# Patient Record
Sex: Male | Born: 1965
Health system: Southern US, Community
[De-identification: ages and names within clinical notes are randomized; demographics above are authoritative.]

## PROBLEM LIST (undated history)

## (undated) ENCOUNTER — Emergency Department (HOSPITAL_COMMUNITY): Admission: EM | Payer: No Typology Code available for payment source | Source: Home / Self Care

## (undated) DIAGNOSIS — E785 Hyperlipidemia, unspecified: Secondary | ICD-10-CM

## (undated) DIAGNOSIS — F1910 Other psychoactive substance abuse, uncomplicated: Secondary | ICD-10-CM

## (undated) DIAGNOSIS — I10 Essential (primary) hypertension: Secondary | ICD-10-CM

## (undated) DIAGNOSIS — E119 Type 2 diabetes mellitus without complications: Secondary | ICD-10-CM

## (undated) DIAGNOSIS — K259 Gastric ulcer, unspecified as acute or chronic, without hemorrhage or perforation: Secondary | ICD-10-CM

## (undated) DIAGNOSIS — B1920 Unspecified viral hepatitis C without hepatic coma: Secondary | ICD-10-CM

## (undated) HISTORY — PX: STOMACH SURGERY: SHX791

## (undated) HISTORY — DX: Type 2 diabetes mellitus without complications: E11.9

## (undated) HISTORY — DX: Other psychoactive substance abuse, uncomplicated: F19.10

---

## 2005-03-27 ENCOUNTER — Emergency Department (HOSPITAL_COMMUNITY): Admission: EM | Admit: 2005-03-27 | Discharge: 2005-03-27 | Payer: Self-pay | Admitting: Family Medicine

## 2005-05-26 ENCOUNTER — Ambulatory Visit: Payer: Self-pay | Admitting: Nurse Practitioner

## 2005-06-15 ENCOUNTER — Ambulatory Visit: Payer: Self-pay | Admitting: *Deleted

## 2005-08-10 ENCOUNTER — Ambulatory Visit: Payer: Self-pay | Admitting: Nurse Practitioner

## 2006-07-26 ENCOUNTER — Emergency Department (HOSPITAL_COMMUNITY): Admission: EM | Admit: 2006-07-26 | Discharge: 2006-07-26 | Payer: Self-pay | Admitting: Emergency Medicine

## 2006-10-10 ENCOUNTER — Emergency Department (HOSPITAL_COMMUNITY): Admission: EM | Admit: 2006-10-10 | Discharge: 2006-10-10 | Payer: Self-pay | Admitting: Emergency Medicine

## 2006-11-21 ENCOUNTER — Emergency Department (HOSPITAL_COMMUNITY): Admission: EM | Admit: 2006-11-21 | Discharge: 2006-11-22 | Payer: Self-pay | Admitting: Emergency Medicine

## 2006-12-21 ENCOUNTER — Emergency Department (HOSPITAL_COMMUNITY): Admission: EM | Admit: 2006-12-21 | Discharge: 2006-12-21 | Payer: Self-pay | Admitting: Emergency Medicine

## 2007-06-15 ENCOUNTER — Emergency Department (HOSPITAL_COMMUNITY): Admission: EM | Admit: 2007-06-15 | Discharge: 2007-06-15 | Payer: Self-pay | Admitting: Emergency Medicine

## 2007-07-31 ENCOUNTER — Emergency Department (HOSPITAL_COMMUNITY): Admission: EM | Admit: 2007-07-31 | Discharge: 2007-07-31 | Payer: Self-pay | Admitting: Emergency Medicine

## 2007-09-21 ENCOUNTER — Emergency Department (HOSPITAL_COMMUNITY): Admission: EM | Admit: 2007-09-21 | Discharge: 2007-09-21 | Payer: Self-pay | Admitting: Family Medicine

## 2007-10-14 ENCOUNTER — Emergency Department (HOSPITAL_COMMUNITY): Admission: EM | Admit: 2007-10-14 | Discharge: 2007-10-14 | Payer: Self-pay | Admitting: Emergency Medicine

## 2007-10-24 ENCOUNTER — Emergency Department (HOSPITAL_COMMUNITY): Admission: EM | Admit: 2007-10-24 | Discharge: 2007-10-24 | Payer: Self-pay | Admitting: Emergency Medicine

## 2007-10-31 ENCOUNTER — Ambulatory Visit: Payer: Self-pay | Admitting: Family Medicine

## 2007-10-31 ENCOUNTER — Encounter (INDEPENDENT_AMBULATORY_CARE_PROVIDER_SITE_OTHER): Payer: Self-pay | Admitting: Nurse Practitioner

## 2007-10-31 LAB — CONVERTED CEMR LAB
ALT: 27 units/L (ref 0–53)
Albumin: 4.4 g/dL (ref 3.5–5.2)
Amylase: 70 units/L (ref 0–105)
Basophils Absolute: 0 10*3/uL (ref 0.0–0.1)
Chloride: 104 meq/L (ref 96–112)
Creatinine, Ser: 1.08 mg/dL (ref 0.40–1.50)
HCV Ab: POSITIVE — AB
HDL: 49 mg/dL (ref >39)
Hep B Core Total Ab: POSITIVE — AB
Lipase: 20 units/L (ref 0–75)
Lymphs Abs: 1.9 10*3/uL (ref 0.7–4.0)
MCV: 96.7 fL (ref 78.0–100.0)
Platelets: 245 10*3/uL (ref 150–400)
RBC: 5.44 M/uL (ref 4.22–5.81)
TSH: 0.509 microintl units/mL (ref 0.350–5.50)
Total Bilirubin: 0.5 mg/dL (ref 0.3–1.2)
Total Protein: 7.4 g/dL (ref 6.0–8.3)
Triglycerides: 160 mg/dL — ABNORMAL HIGH (ref <150)
WBC: 5 10*3/uL (ref 4.0–10.5)

## 2007-11-15 ENCOUNTER — Ambulatory Visit: Payer: Self-pay | Admitting: Internal Medicine

## 2007-11-15 DIAGNOSIS — B182 Chronic viral hepatitis C: Secondary | ICD-10-CM

## 2007-11-15 DIAGNOSIS — R1013 Epigastric pain: Secondary | ICD-10-CM

## 2007-11-15 LAB — CONVERTED CEMR LAB
HCV Quantitative: 1390000 intl units/mL — ABNORMAL HIGH (ref <43)
Hepatitis B Surface Ag: NEGATIVE

## 2007-11-22 ENCOUNTER — Ambulatory Visit (HOSPITAL_COMMUNITY): Admission: RE | Admit: 2007-11-22 | Discharge: 2007-11-22 | Payer: Self-pay | Admitting: Internal Medicine

## 2007-11-22 ENCOUNTER — Encounter: Payer: Self-pay | Admitting: Internal Medicine

## 2007-11-23 ENCOUNTER — Telehealth: Payer: Self-pay | Admitting: Internal Medicine

## 2007-11-24 ENCOUNTER — Ambulatory Visit: Payer: Self-pay | Admitting: Internal Medicine

## 2007-12-11 ENCOUNTER — Telehealth: Payer: Self-pay | Admitting: Internal Medicine

## 2007-12-14 ENCOUNTER — Ambulatory Visit: Payer: Self-pay | Admitting: Internal Medicine

## 2007-12-14 ENCOUNTER — Encounter (INDEPENDENT_AMBULATORY_CARE_PROVIDER_SITE_OTHER): Payer: Self-pay | Admitting: Nurse Practitioner

## 2007-12-14 LAB — CONVERTED CEMR LAB
Cholesterol: 212 mg/dL — ABNORMAL HIGH (ref 0–200)
HDL: 45 mg/dL (ref >39)
HIV-1 RNA Quant, Log: <1.7 (ref <1.70)
LDL Cholesterol: 153 mg/dL — ABNORMAL HIGH (ref 0–99)
Total CHOL/HDL Ratio: 4.7
Triglycerides: 68 mg/dL (ref <150)
VLDL: 14 mg/dL (ref 0–40)

## 2007-12-20 ENCOUNTER — Encounter (INDEPENDENT_AMBULATORY_CARE_PROVIDER_SITE_OTHER): Payer: Self-pay | Admitting: Nurse Practitioner

## 2007-12-20 LAB — CONVERTED CEMR LAB: HCV Quantitative: 2860000 intl units/mL — ABNORMAL HIGH (ref <43)

## 2008-01-08 ENCOUNTER — Ambulatory Visit: Payer: Self-pay | Admitting: Internal Medicine

## 2008-01-09 ENCOUNTER — Ambulatory Visit: Payer: Self-pay | Admitting: Internal Medicine

## 2008-02-06 ENCOUNTER — Encounter (INDEPENDENT_AMBULATORY_CARE_PROVIDER_SITE_OTHER): Payer: Self-pay | Admitting: Diagnostic Radiology

## 2008-02-06 ENCOUNTER — Ambulatory Visit (HOSPITAL_COMMUNITY): Admission: RE | Admit: 2008-02-06 | Discharge: 2008-02-06 | Payer: Self-pay | Admitting: Internal Medicine

## 2008-05-16 ENCOUNTER — Ambulatory Visit: Payer: Self-pay | Admitting: Gastroenterology

## 2008-05-16 ENCOUNTER — Encounter: Payer: Self-pay | Admitting: Internal Medicine

## 2008-05-27 ENCOUNTER — Ambulatory Visit: Payer: Self-pay | Admitting: Internal Medicine

## 2008-05-27 ENCOUNTER — Encounter (INDEPENDENT_AMBULATORY_CARE_PROVIDER_SITE_OTHER): Payer: Self-pay | Admitting: Internal Medicine

## 2008-05-27 LAB — CONVERTED CEMR LAB
ALT: 51 units/L (ref 0–53)
AST: 34 units/L (ref 0–37)
Alkaline Phosphatase: 61 units/L (ref 39–117)
Chloride: 106 meq/L (ref 96–112)
Creatinine, Ser: 0.91 mg/dL (ref 0.40–1.50)
Potassium: 4.2 meq/L (ref 3.5–5.3)
Total Bilirubin: 0.5 mg/dL (ref 0.3–1.2)

## 2010-02-02 ENCOUNTER — Emergency Department (HOSPITAL_COMMUNITY): Admission: EM | Admit: 2010-02-02 | Discharge: 2010-02-03 | Payer: Self-pay | Admitting: Emergency Medicine

## 2010-02-23 ENCOUNTER — Emergency Department (HOSPITAL_COMMUNITY): Admission: EM | Admit: 2010-02-23 | Discharge: 2010-02-23 | Payer: Self-pay | Admitting: Emergency Medicine

## 2010-03-10 ENCOUNTER — Emergency Department (HOSPITAL_COMMUNITY): Admission: EM | Admit: 2010-03-10 | Discharge: 2010-03-10 | Payer: Self-pay | Admitting: Family Medicine

## 2010-03-16 ENCOUNTER — Encounter (INDEPENDENT_AMBULATORY_CARE_PROVIDER_SITE_OTHER): Payer: Self-pay | Admitting: *Deleted

## 2010-03-16 LAB — CONVERTED CEMR LAB
ALT: 90 units/L — ABNORMAL HIGH (ref 0–53)
Albumin: 4.2 g/dL (ref 3.5–5.2)
Basophils Relative: 1 % (ref 0–1)
Calcium: 8.9 mg/dL (ref 8.4–10.5)
Chloride: 104 meq/L (ref 96–112)
Eosinophils Absolute: 0.1 10*3/uL (ref 0.0–0.7)
Eosinophils Relative: 1 % (ref 0–5)
Hemoglobin: 15.8 g/dL (ref 13.0–17.0)
MCHC: 34.1 g/dL (ref 30.0–36.0)
Monocytes Absolute: 0.5 10*3/uL (ref 0.1–1.0)
Neutro Abs: 3.3 10*3/uL (ref 1.7–7.7)
Total Protein: 7 g/dL (ref 6.0–8.3)
WBC: 5.7 10*3/uL (ref 4.0–10.5)

## 2010-03-24 LAB — HEMOCCULT SLIDES (X 3 CARDS)

## 2010-04-17 ENCOUNTER — Emergency Department (HOSPITAL_COMMUNITY)
Admission: EM | Admit: 2010-04-17 | Discharge: 2010-04-17 | Payer: Self-pay | Source: Home / Self Care | Admitting: Emergency Medicine

## 2010-04-19 ENCOUNTER — Emergency Department (HOSPITAL_COMMUNITY)
Admission: EM | Admit: 2010-04-19 | Discharge: 2010-04-19 | Payer: Self-pay | Source: Home / Self Care | Admitting: Emergency Medicine

## 2010-05-22 ENCOUNTER — Emergency Department (HOSPITAL_COMMUNITY)
Admission: EM | Admit: 2010-05-22 | Discharge: 2010-05-22 | Payer: Self-pay | Source: Home / Self Care | Admitting: Emergency Medicine

## 2010-08-17 LAB — GLUCOSE, CAPILLARY: Glucose-Capillary: 108 mg/dL — ABNORMAL HIGH (ref 70–99)

## 2010-08-18 LAB — DIFFERENTIAL
Basophils Absolute: 0 10*3/uL (ref 0.0–0.1)
Basophils Absolute: 0 10*3/uL (ref 0.0–0.1)
Basophils Relative: 0 % (ref 0–1)
Basophils Relative: 0 % (ref 0–1)
Eosinophils Absolute: 0 10*3/uL (ref 0.0–0.7)
Eosinophils Absolute: 0.2 10*3/uL (ref 0.0–0.7)
Eosinophils Relative: 1 % (ref 0–5)
Eosinophils Relative: 2 % (ref 0–5)
Monocytes Absolute: 0.4 10*3/uL (ref 0.1–1.0)
Monocytes Absolute: 0.5 10*3/uL (ref 0.1–1.0)
Monocytes Relative: 7 % (ref 3–12)
Neutro Abs: 4.2 10*3/uL (ref 1.7–7.7)

## 2010-08-18 LAB — BASIC METABOLIC PANEL
Calcium: 8.6 mg/dL (ref 8.4–10.5)
Creatinine, Ser: 0.93 mg/dL (ref 0.4–1.5)
GFR calc non Af Amer: >60 mL/min (ref >60)
Sodium: 136 mEq/L (ref 135–145)

## 2010-08-18 LAB — COMPREHENSIVE METABOLIC PANEL
ALT: 60 U/L — ABNORMAL HIGH (ref 0–53)
Albumin: 3.8 g/dL (ref 3.5–5.2)
Calcium: 8.5 mg/dL (ref 8.4–10.5)
GFR calc Af Amer: >60 mL/min (ref >60)
Sodium: 136 mEq/L (ref 135–145)
Total Protein: 7.4 g/dL (ref 6.0–8.3)

## 2010-08-18 LAB — CBC
HCT: 43.1 % (ref 39.0–52.0)
HCT: 44.1 % (ref 39.0–52.0)
Hemoglobin: 15.2 g/dL (ref 13.0–17.0)
Hemoglobin: 15.7 g/dL (ref 13.0–17.0)
MCH: 32.8 pg (ref 26.0–34.0)
MCHC: 35.6 g/dL (ref 30.0–36.0)
MCV: 92.1 fL (ref 78.0–100.0)
Platelets: 158 10*3/uL (ref 150–400)
RBC: 4.79 MIL/uL (ref 4.22–5.81)
RDW: 12.1 % (ref 11.5–15.5)
WBC: 5.4 10*3/uL (ref 4.0–10.5)

## 2010-08-18 LAB — LIPASE, BLOOD: Lipase: 28 U/L (ref 11–59)

## 2010-08-18 LAB — PROTIME-INR: Prothrombin Time: 12.7 seconds (ref 11.6–15.2)

## 2010-08-20 LAB — CBC
MCV: 94.1 fL (ref 78.0–100.0)
Platelets: 242 10*3/uL (ref 150–400)
RBC: 5.41 MIL/uL (ref 4.22–5.81)
RDW: 12.8 % (ref 11.5–15.5)
WBC: 9.1 10*3/uL (ref 4.0–10.5)

## 2010-08-20 LAB — COMPREHENSIVE METABOLIC PANEL
ALT: 268 U/L — ABNORMAL HIGH (ref 0–53)
AST: 162 U/L — ABNORMAL HIGH (ref 0–37)
Albumin: 4.1 g/dL (ref 3.5–5.2)
Alkaline Phosphatase: 80 U/L (ref 39–117)
Chloride: 99 mEq/L (ref 96–112)
GFR calc Af Amer: >60 mL/min (ref >60)
Potassium: 3.8 mEq/L (ref 3.5–5.1)
Sodium: 135 mEq/L (ref 135–145)
Total Bilirubin: 2 mg/dL — ABNORMAL HIGH (ref 0.3–1.2)
Total Protein: 8.2 g/dL (ref 6.0–8.3)

## 2010-08-20 LAB — URINALYSIS, ROUTINE W REFLEX MICROSCOPIC
Hgb urine dipstick: NEGATIVE
Nitrite: NEGATIVE
Protein, ur: NEGATIVE mg/dL
Specific Gravity, Urine: 1.02 (ref 1.005–1.030)
Urobilinogen, UA: 1 mg/dL (ref 0.0–1.0)

## 2010-08-20 LAB — POCT I-STAT, CHEM 8
BUN: 10 mg/dL (ref 6–23)
Calcium, Ion: 1.1 mmol/L — ABNORMAL LOW (ref 1.12–1.32)
Chloride: 102 mEq/L (ref 96–112)
Glucose, Bld: 153 mg/dL — ABNORMAL HIGH (ref 70–99)
Potassium: 3.9 mEq/L (ref 3.5–5.1)
TCO2: 29 mmol/L (ref 0–100)

## 2010-08-20 LAB — GLUCOSE, CAPILLARY: Glucose-Capillary: 159 mg/dL — ABNORMAL HIGH (ref 70–99)

## 2010-08-20 LAB — DIFFERENTIAL
Basophils Absolute: 0 10*3/uL (ref 0.0–0.1)
Basophils Relative: 0 % (ref 0–1)
Eosinophils Relative: 1 % (ref 0–5)
Monocytes Absolute: 1.2 10*3/uL — ABNORMAL HIGH (ref 0.1–1.0)
Monocytes Relative: 14 % — ABNORMAL HIGH (ref 3–12)
Neutro Abs: 5.2 10*3/uL (ref 1.7–7.7)

## 2010-08-21 LAB — COMPREHENSIVE METABOLIC PANEL
ALT: 392 U/L — ABNORMAL HIGH (ref 0–53)
AST: 419 U/L — ABNORMAL HIGH (ref 0–37)
CO2: 23 mEq/L (ref 19–32)
Chloride: 101 mEq/L (ref 96–112)
GFR calc Af Amer: >60 mL/min (ref >60)
GFR calc non Af Amer: >60 mL/min (ref >60)
Glucose, Bld: 142 mg/dL — ABNORMAL HIGH (ref 70–99)
Sodium: 138 mEq/L (ref 135–145)
Total Bilirubin: 1.9 mg/dL — ABNORMAL HIGH (ref 0.3–1.2)

## 2010-08-21 LAB — CBC
HCT: 47.9 % (ref 39.0–52.0)
Hemoglobin: 17.2 g/dL — ABNORMAL HIGH (ref 13.0–17.0)
MCH: 33.7 pg (ref 26.0–34.0)
MCHC: 35.9 g/dL (ref 30.0–36.0)
RBC: 5.1 MIL/uL (ref 4.22–5.81)

## 2010-08-21 LAB — AMYLASE: Amylase: 65 U/L (ref 0–105)

## 2010-08-21 LAB — DIFFERENTIAL
Basophils Absolute: 0 10*3/uL (ref 0.0–0.1)
Basophils Relative: 0 % (ref 0–1)
Eosinophils Absolute: 0 10*3/uL (ref 0.0–0.7)
Eosinophils Relative: 0 % (ref 0–5)
Neutrophils Relative %: 68 % (ref 43–77)

## 2010-08-21 LAB — LIPASE, BLOOD: Lipase: 24 U/L (ref 11–59)

## 2010-08-21 LAB — ETHANOL: Alcohol, Ethyl (B): 155 mg/dL — ABNORMAL HIGH (ref 0–10)

## 2010-08-21 LAB — SAMPLE TO BLOOD BANK

## 2010-10-16 ENCOUNTER — Emergency Department (HOSPITAL_COMMUNITY)
Admission: EM | Admit: 2010-10-16 | Discharge: 2010-10-16 | Disposition: A | Discharge status: Home / Self Care | Payer: Self-pay | Attending: Emergency Medicine | Admitting: Emergency Medicine

## 2010-10-16 DIAGNOSIS — L02818 Cutaneous abscess of other sites: Secondary | ICD-10-CM | POA: Insufficient documentation

## 2010-10-16 DIAGNOSIS — E119 Type 2 diabetes mellitus without complications: Secondary | ICD-10-CM | POA: Insufficient documentation

## 2010-10-16 DIAGNOSIS — E7800 Pure hypercholesterolemia, unspecified: Secondary | ICD-10-CM | POA: Insufficient documentation

## 2010-10-16 DIAGNOSIS — Z79899 Other long term (current) drug therapy: Secondary | ICD-10-CM | POA: Insufficient documentation

## 2010-10-16 DIAGNOSIS — R220 Localized swelling, mass and lump, head: Secondary | ICD-10-CM | POA: Insufficient documentation

## 2010-10-16 DIAGNOSIS — R221 Localized swelling, mass and lump, neck: Secondary | ICD-10-CM | POA: Insufficient documentation

## 2010-10-16 DIAGNOSIS — B350 Tinea barbae and tinea capitis: Secondary | ICD-10-CM | POA: Insufficient documentation

## 2010-10-16 DIAGNOSIS — Z8619 Personal history of other infectious and parasitic diseases: Secondary | ICD-10-CM | POA: Insufficient documentation

## 2010-11-04 ENCOUNTER — Emergency Department (HOSPITAL_COMMUNITY): Payer: Self-pay

## 2010-11-04 ENCOUNTER — Emergency Department (HOSPITAL_COMMUNITY)
Admission: EM | Admit: 2010-11-04 | Discharge: 2010-11-04 | Disposition: A | Discharge status: Home / Self Care | Payer: Self-pay | Attending: Emergency Medicine | Admitting: Emergency Medicine

## 2010-11-04 DIAGNOSIS — IMO0002 Reserved for concepts with insufficient information to code with codable children: Secondary | ICD-10-CM | POA: Insufficient documentation

## 2010-11-04 DIAGNOSIS — E119 Type 2 diabetes mellitus without complications: Secondary | ICD-10-CM | POA: Insufficient documentation

## 2010-11-04 DIAGNOSIS — L0889 Other specified local infections of the skin and subcutaneous tissue: Secondary | ICD-10-CM | POA: Insufficient documentation

## 2010-11-04 DIAGNOSIS — E7800 Pure hypercholesterolemia, unspecified: Secondary | ICD-10-CM | POA: Insufficient documentation

## 2010-11-04 DIAGNOSIS — B1920 Unspecified viral hepatitis C without hepatic coma: Secondary | ICD-10-CM | POA: Insufficient documentation

## 2010-11-04 DIAGNOSIS — S60949A Unspecified superficial injury of unspecified finger, initial encounter: Secondary | ICD-10-CM | POA: Insufficient documentation

## 2011-02-04 ENCOUNTER — Ambulatory Visit (INDEPENDENT_AMBULATORY_CARE_PROVIDER_SITE_OTHER): Payer: Self-pay | Admitting: Gastroenterology

## 2011-02-04 ENCOUNTER — Other Ambulatory Visit: Payer: Self-pay | Admitting: Gastroenterology

## 2011-02-04 VITALS — BP 127/96 | HR 103 | Temp 97.5°F | Ht 70.0 in | Wt 182.0 lb

## 2011-02-04 DIAGNOSIS — B182 Chronic viral hepatitis C: Secondary | ICD-10-CM

## 2011-02-04 LAB — CBC WITH DIFFERENTIAL/PLATELET
Basophils Relative: 0 % (ref 0–1)
Eosinophils Absolute: 0.1 10*3/uL (ref 0.0–0.7)
Eosinophils Relative: 1 % (ref 0–5)
HCT: 46.2 % (ref 39.0–52.0)
Hemoglobin: 16.2 g/dL (ref 13.0–17.0)
MCH: 32.5 pg (ref 26.0–34.0)
MCHC: 35.1 g/dL (ref 30.0–36.0)
MCV: 92.6 fL (ref 78.0–100.0)
Monocytes Absolute: 0.5 10*3/uL (ref 0.1–1.0)
Monocytes Relative: 6 % (ref 3–12)
Neutro Abs: 4.6 10*3/uL (ref 1.7–7.7)

## 2011-02-04 LAB — COMPREHENSIVE METABOLIC PANEL
ALT: 30 U/L (ref 0–53)
CO2: 24 mEq/L (ref 19–32)
Calcium: 9 mg/dL (ref 8.4–10.5)
Chloride: 107 mEq/L (ref 96–112)
Creat: 1.09 mg/dL (ref 0.50–1.35)
Glucose, Bld: 136 mg/dL — ABNORMAL HIGH (ref 70–99)

## 2011-02-04 LAB — PROTIME-INR: INR: 0.95 (ref <1.50)

## 2011-02-18 NOTE — Progress Notes (Signed)
NAMEVALENTINO, Wolfe  MR#:  [id number]      DATE:  02/04/2011  DOB:  08-Nov-1965    cc: Consulting Physician:  None. Primary Care Physician:  Same. Referring Physician:  Norberto Sorenson, MD, [facility], [address] [place], [place] [address], Fax [phone]    REASON FOR VISIT:  Follow up of genotype 1a hepatitis C.    history:  The patient returns today unaccompanied. I last saw him on 05/16/2008 for his genotype 1a hepatitis C. At the time time he was a poor candidate for treatment because of active alcohol use. My original  note did not state this but today the referral indicated that since he stopped drinking alcohol, he wanted to be considered for treatment.  Today, the patient admits that he was actively drinking at the time of  being seen, but he has now been abstinent from alcohol since 03/25/2010, and wants to be treated. There are no symptoms referable  to his history of hepatitis C nor are there symptoms to suggest cryoglobulin mediated or decompensated liver disease.   Past MEDICAL HISTORY:  Significant for hypertension.   CURRENT MEDICATIONS:  Lisinopril 5 mg p.o. daily, Centrum multivitamin 1 tablet p.o. daily.    Allergies:  Denies.   Habits:  Smoking, rarely will smoke.  Alcohol, reports that he is completely abstinent from alcohol since 03/25/2010. Two months ago he graduated  from counseling from [facility]. He also denies any history of intravenous or intranasal drug use since 2011.   REVIEW OF SYSTEMS:  All 10 systems reviewed today with the patient and they are negative other than which was mentioned above. CES-D was 14.   PHYSICAL EXAMINATION:  Constitutional: Well appearing without stigmata of chronic liver disease. Vital signs: Height 70 inches, weight 182 pounds, blood pressure 127/96, pulse of 103, temperature 97.5 Fahrenheit.  Ears,  nose, mouth and throat:  Unremarkable oropharynx.  No thyromegaly or neck masses.  Chest:   Resonant to percussion.  Clear to auscultation.  Cardiovascular:  Heart sounds normal S1, S2 without murmurs or rubs.   There is no peripheral edema.  Abdominal:  Normal bowel sounds.  No masses or tenderness.  I could not appreciate a liver edge or spleen tip.  I could not appreciate any hernias.  Lymphatics:  No cervical or  inguinal lymphadenopathy.  Central Nervous System:  No asterixis or focal neurologic findings.  Dermatologic:  Anicteric without palmar  erythema or spider angiomata.  Eyes:  Anicteric sclerae.  Pupils are equal and reactive to light.   laboratories:  On 01/15/2010, CBC was unremarkable with a platelet count of 237, total bilirubin was 1.01, AST 47, ALT 90, ALP 60, total bilirubin 0.8, creatinine 1.01. His albumin was 4.2, globulin 3.2.  Hemoglobin A1c  was 6.0%.  It will be recalled he underwent a liver biopsy on 02/06/2008, showing grade 1 stage zero disease.   ASSESSMENT:  The patient is a 45 year old gentleman with history of genotype 1a hepatitis C with biopsy on 02/06/2008 showing grade 1 stage zero disease.  His IL 28B is unknown at this time. He appears to be  clinically and biochemically well compensated. He is now very motivated to be treated for his hepatitis C and I do not see any overt contraindication particularly because he now admits that he was  drinking but now has stopped with counseling.  Today, I reviewed the results of his liver biopsy and its significance in addition to the significance  of his genotype. We discussed our treatment protocol for our clinic. I reviewed treatment with pegylated  interferon, ribavirin, and a protease inhibitor. I reviewed the response rates, as well as the specific system, constitutional, and psychiatric side effects of treatment. We also discussed the fact that  he would have to apply for patient assistance because he lacks insurance. I explained to him that this would be a significant commitment of time to make sure that he  was successful in treatment. I  also discussed the fact he could afford to wait because he has no significant fibrosis in the past, and now has stopped drinking further reducing the amount of injury to his liver. 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 He states that he needs to do this because he understands this will require commitment of time to be  treated. We discussed the possibility of delaying until the availability of treatment regimen particularly that of TMC 435, which would be easier to dose. He was very much in favor of waiting and  discussing the possibility of being treated with his co-workers in terms of scheduling his appointments. To that end, we agreed to see  each other again in 6 months' time to discuss treatment and review the available therapies.   plan:  1. Will check liver enzymes and CBC today. 2. He is hepatitis A and B immune. 3. I will see him again in 6 months' time. 4. I have not check the IL 28B, first because he is not ready to start on therapy. Second, because at this point, using protease inhibitors, his kinetics will be more important than the results of the IL 28B.            Brooke Dare, MD   ADDENDUM:  Labs acceptable.  [id number] .[id number]  D:  Thu Aug 30 16:26:31 2012 ; T:  Thu Aug 30 19:22:02 2012  Job #:  [id number]

## 2011-02-25 LAB — COMPREHENSIVE METABOLIC PANEL
ALT: 76 — ABNORMAL HIGH
Albumin: 3.7
Alkaline Phosphatase: 55
Potassium: 4.3
Sodium: 134 — ABNORMAL LOW
Total Protein: 7.3

## 2011-02-25 LAB — DIFFERENTIAL
Basophils Relative: 1
Eosinophils Absolute: 0.2
Eosinophils Relative: 2
Monocytes Absolute: 0.8
Monocytes Relative: 12

## 2011-02-25 LAB — CBC
Hemoglobin: 16.6
Platelets: 217
RDW: 12.1

## 2011-02-25 LAB — OCCULT BLOOD X 1 CARD TO LAB, STOOL: Fecal Occult Bld: NEGATIVE

## 2011-03-02 LAB — POCT I-STAT, CHEM 8
BUN: 5 — ABNORMAL LOW
Calcium, Ion: 1.14
Chloride: 102
Creatinine, Ser: 1.3
Glucose, Bld: 130 — ABNORMAL HIGH
HCT: 56 — ABNORMAL HIGH
Hemoglobin: 19 — ABNORMAL HIGH
Potassium: 3.9
Sodium: 137
TCO2: 27

## 2011-03-02 LAB — COMPREHENSIVE METABOLIC PANEL
ALT: 53
AST: 57 — ABNORMAL HIGH
Alkaline Phosphatase: 61
CO2: 27
Calcium: 9.1
GFR calc Af Amer: >60
Glucose, Bld: 136 — ABNORMAL HIGH
Potassium: 3.8
Sodium: 135
Total Protein: 7.7

## 2011-03-02 LAB — CBC
HCT: 49.9
Hemoglobin: 17.7 — ABNORMAL HIGH
MCHC: 35.4
RDW: 12.6

## 2011-03-02 LAB — DIFFERENTIAL
Basophils Absolute: 0
Basophils Relative: 1
Eosinophils Absolute: 0.3
Eosinophils Relative: 4
Lymphocytes Relative: 20
Lymphs Abs: 1.3
Monocytes Absolute: 0.6
Monocytes Relative: 9
Neutro Abs: 4.2
Neutrophils Relative %: 66

## 2011-03-02 LAB — POCT URINALYSIS DIP (DEVICE)
Bilirubin Urine: NEGATIVE
Glucose, UA: NEGATIVE
Hgb urine dipstick: NEGATIVE
Ketones, ur: NEGATIVE
Nitrite: NEGATIVE
Operator id: 239701
Protein, ur: NEGATIVE
Specific Gravity, Urine: 1.01
Urobilinogen, UA: 4 — ABNORMAL HIGH
pH: 6.5

## 2011-03-02 LAB — LIPASE, BLOOD: Lipase: 19

## 2011-03-02 LAB — RAPID URINE DRUG SCREEN, HOSP PERFORMED
Amphetamines: NOT DETECTED
Barbiturates: NOT DETECTED
Benzodiazepines: NOT DETECTED
Cocaine: NOT DETECTED
Opiates: NOT DETECTED

## 2011-03-03 LAB — COMPREHENSIVE METABOLIC PANEL
ALT: 40
AST: 38 — ABNORMAL HIGH
Albumin: 4.4
Alkaline Phosphatase: 58
BUN: 8
CO2: 27
Calcium: 9.4
Chloride: 102
Creatinine, Ser: 1.14
GFR calc Af Amer: >60
GFR calc non Af Amer: >60
Glucose, Bld: 107 — ABNORMAL HIGH
Potassium: 4.7
Sodium: 138
Total Bilirubin: 1.7 — ABNORMAL HIGH
Total Protein: 7.6

## 2011-03-03 LAB — DIFFERENTIAL
Basophils Absolute: 0.1
Basophils Relative: 1
Eosinophils Absolute: 0.3
Eosinophils Relative: 3
Lymphocytes Relative: 27
Lymphs Abs: 2.1
Monocytes Absolute: 0.6
Monocytes Relative: 7
Neutro Abs: 5
Neutrophils Relative %: 62

## 2011-03-03 LAB — CBC
HCT: 52.3 — ABNORMAL HIGH
Hemoglobin: 17.9 — ABNORMAL HIGH
MCHC: 34.3
MCV: 95.2
Platelets: 237
RBC: 5.49
RDW: 12.3
WBC: 8

## 2011-03-03 LAB — URINALYSIS, ROUTINE W REFLEX MICROSCOPIC
Bilirubin Urine: NEGATIVE
Glucose, UA: NEGATIVE
Hgb urine dipstick: NEGATIVE
Ketones, ur: 15 — AB
Nitrite: NEGATIVE
Protein, ur: NEGATIVE
Specific Gravity, Urine: 1.016
Urobilinogen, UA: 1
pH: 6

## 2011-03-03 LAB — LIPASE, BLOOD: Lipase: 18

## 2011-03-10 LAB — CBC
Hemoglobin: 16.2
RBC: 5.11
WBC: 6.3

## 2011-03-10 LAB — APTT: aPTT: 26

## 2011-03-10 LAB — PROTIME-INR: INR: 1

## 2011-03-22 LAB — I-STAT 8, (EC8 V) (CONVERTED LAB)
BUN: 10
Chloride: 105
HCT: 50
Operator id: 288831
pCO2, Ven: 34.6 — ABNORMAL LOW
pH, Ven: 7.418 — ABNORMAL HIGH

## 2011-03-22 LAB — POCT CARDIAC MARKERS
CKMB, poc: 1.3
Myoglobin, poc: 78

## 2011-03-24 LAB — POCT CARDIAC MARKERS
CKMB, poc: 1.1
Myoglobin, poc: 85.7
Operator id: 277751
Troponin i, poc: <0.05

## 2011-03-24 LAB — D-DIMER, QUANTITATIVE: D-Dimer, Quant: 0.32

## 2012-04-24 ENCOUNTER — Emergency Department (INDEPENDENT_AMBULATORY_CARE_PROVIDER_SITE_OTHER)
Admission: EM | Admit: 2012-04-24 | Discharge: 2012-04-24 | Disposition: A | Discharge status: Home / Self Care | Payer: Self-pay | Source: Home / Self Care

## 2012-04-24 ENCOUNTER — Encounter (HOSPITAL_COMMUNITY): Payer: Self-pay | Admitting: *Deleted

## 2012-04-24 DIAGNOSIS — B182 Chronic viral hepatitis C: Secondary | ICD-10-CM

## 2012-04-24 DIAGNOSIS — E785 Hyperlipidemia, unspecified: Secondary | ICD-10-CM

## 2012-04-24 DIAGNOSIS — I10 Essential (primary) hypertension: Secondary | ICD-10-CM

## 2012-04-24 HISTORY — DX: Hyperlipidemia, unspecified: E78.5

## 2012-04-24 HISTORY — DX: Essential (primary) hypertension: I10

## 2012-04-24 MED ORDER — LISINOPRIL 10 MG PO TABS: 10.0000 mg | ORAL_TABLET | Freq: Every day | ORAL | Status: DC

## 2012-04-24 MED ORDER — PRAVASTATIN SODIUM 20 MG PO TABS: 20.0000 mg | ORAL_TABLET | Freq: Every day | ORAL | Status: DC

## 2012-04-24 NOTE — ED Notes (Addendum)
Pt was last seen at [facility].  He is here today asking for refills of his lisinopril and pravastatin.   He has no complaints today

## 2012-04-24 NOTE — ED Provider Notes (Signed)
History     CSN: [id number]  Arrival date & time 04/24/12  1011   First MD Initiated Contact with Patient 04/24/12 1154      Chief Complaint  Patient presents with  . Medication Refill   (Consider location/radiation/quality/duration/timing/severity/associated sxs/prior treatment) The history is provided by the patient. No language interpreter was used.    Pt says he is well but needing refills of BP meds, says he is tolerating meds well.  No complaints.   Past Medical History  Diagnosis Date  . Hypertension   . Hyperlipidemia     Past Surgical History  Procedure Date  . Stomach surgery     Family History  Problem Relation Age of Onset  . Diabetes Father   . Hypertension Father   . Diabetes Sister   . Hypertension Sister   . Diabetes Brother   . Hypertension Brother     History  Substance Use Topics  . Smoking status: Never Smoker   . Smokeless tobacco: Not on file  . Alcohol Use: No    Review of Systems  Constitutional: Negative.   HENT: Negative.   Eyes: Negative.   Respiratory: Negative.   Cardiovascular: Negative.   Gastrointestinal: Negative.   Musculoskeletal: Negative.   Neurological: Negative.   Hematological: Negative.   Psychiatric/Behavioral: Negative.     Allergies  Ibuprofen  Home Medications   Current Outpatient Rx  Name  Route  Sig  Dispense  Refill  . LISINOPRIL 10 MG PO TABS   Oral   Take 10 mg by mouth daily.         Marland Kitchen PRAVASTATIN SODIUM 20 MG PO TABS   Oral   Take 20 mg by mouth daily.           BP 124/78  Pulse 86  Temp 97.8 F (36.6 C) (Oral)  Resp 18  SpO2 99%  Physical Exam  Vitals reviewed. Constitutional: He is oriented to person, place, and time. He appears well-developed and well-nourished.  HENT:  Head: Normocephalic and atraumatic.  Eyes: Pupils are equal, round, and reactive to light.  Neck: Normal range of motion. Neck supple.  Cardiovascular: Normal rate, regular rhythm and normal heart  sounds.   Pulmonary/Chest: Effort normal and breath sounds normal.  Abdominal: Soft.  Musculoskeletal: Normal range of motion.  Neurological: He is alert and oriented to person, place, and time.  Skin: Skin is warm and dry.    ED Course  Procedures (including critical care time)  Labs Reviewed - No data to display No results found.   No diagnosis found.    MDM  Hypertension - refilled lisinopril today Hepatitis C - stable, follow up with liver clinic for regular labs Return in 3 months for regular follow up care  Cleora Fleet, MD, CDE, FAAFP [facility] [place], [place]          Cleora Fleet, MD 04/24/12 2006

## 2012-04-25 DIAGNOSIS — I10 Essential (primary) hypertension: Secondary | ICD-10-CM | POA: Insufficient documentation

## 2012-08-23 ENCOUNTER — Telehealth: Payer: Self-pay | Admitting: Family Medicine

## 2012-08-23 NOTE — Telephone Encounter (Signed)
dt ?

## 2012-09-26 ENCOUNTER — Emergency Department (INDEPENDENT_AMBULATORY_CARE_PROVIDER_SITE_OTHER)
Admission: EM | Admit: 2012-09-26 | Discharge: 2012-09-26 | Disposition: A | Discharge status: Home / Self Care | Payer: Self-pay | Source: Home / Self Care | Attending: Emergency Medicine | Admitting: Emergency Medicine

## 2012-09-26 ENCOUNTER — Encounter (HOSPITAL_COMMUNITY): Payer: Self-pay | Admitting: *Deleted

## 2012-09-26 DIAGNOSIS — I10 Essential (primary) hypertension: Secondary | ICD-10-CM

## 2012-09-26 DIAGNOSIS — K047 Periapical abscess without sinus: Secondary | ICD-10-CM

## 2012-09-26 DIAGNOSIS — E785 Hyperlipidemia, unspecified: Secondary | ICD-10-CM

## 2012-09-26 DIAGNOSIS — K044 Acute apical periodontitis of pulpal origin: Secondary | ICD-10-CM

## 2012-09-26 DIAGNOSIS — R51 Headache: Secondary | ICD-10-CM

## 2012-09-26 MED ORDER — LISINOPRIL 10 MG PO TABS: 10.0000 mg | ORAL_TABLET | Freq: Every day | ORAL | Status: DC

## 2012-09-26 MED ORDER — TRAMADOL HCL 50 MG PO TABS: 100.0000 mg | ORAL_TABLET | Freq: Three times a day (TID) | ORAL | Status: DC | PRN

## 2012-09-26 MED ORDER — PENICILLIN V POTASSIUM 500 MG PO TABS: 500.0000 mg | ORAL_TABLET | Freq: Four times a day (QID) | ORAL | Status: DC

## 2012-09-26 NOTE — ED Provider Notes (Signed)
Chief Complaint:   Chief Complaint  Patient presents with  . Headache    History of Present Illness:   Chad Wolfe is a 47 year old male who comes in today with a two-day history of intermittent headache. This is bifrontal and described as an ache. Is rated 5-6/10 in intensity. He attributes this to allergies and elevated blood pressure. He has been off of his lisinopril 10 mg for about a month. He denies any nausea, photophobia or phonophobia and he's had no blurry vision or neurological symptoms. He has had allergies all his life, worse in the spring and the fall. He's had nasal congestion, rhinorrhea, sneezing, itchy, watery eyes. He denies any purulent drainage. He's had no fever, stiff neck, or neurological symptoms. He's been on lisinopril for blood pressure for years. He's tolerating this medication well. He also has hyperlipidemia and hepatitis C. He was being seen at [facility] but has not seen a physician since last fall. He also mentioned that he's had a infected left upper second molar is bothering him and this may be contributing to the headache as well.  Review of Systems:  Other than noted above, the patient denies any of the following symptoms: Systemic:  No fever, chills, fatigue, photophobia, stiff neck. Eye:  No redness, eye pain, discharge, blurred vision, or diplopia. ENT:  No nasal congestion, rhinorrhea, sinus pressure or pain, sneezing, earache, or sore throat.  No jaw claudication. Neuro:  No paresthesias, loss of consciousness, seizure activity, muscle weakness, trouble with coordination or gait, trouble speaking or swallowing. Psych:  No depression, anxiety or trouble sleeping.  PMFSH:  Past medical history, family history, social history, meds, and allergies were reviewed.  He's allergic to aspirin. His only medication has been lisinopril. He has hyperlipidemia, hypertension, and hepatitis C.  Physical Exam:   Vital signs:  BP 157/99  Pulse 82   Temp(Src) 98.1 F (36.7 C) (Oral)  Resp 16  SpO2 98% General:  Alert and oriented.  In no distress. Eye:  Lids and conjunctivas normal.  PERRL,  Full EOMs.  Fundi benign with normal discs and vessels. ENT:  No cranial or facial tenderness to palpation.  TMs and canals clear.  Nasal mucosa was normal and uncongested without any drainage. No intra oral lesions, pharynx clear, mucous membranes moist, dentition normal. He has a decayed and painful left upper second molar. Neck:  Supple, full ROM, no tenderness to palpation.  No adenopathy or mass. Neuro:  Alert and orented times 3.  Speech was clear, fluent, and appropriate.  Cranial nerves intact. No pronator drift, muscle strength normal. Finger to nose normal.  DTRs were 2+ and symmetrical.Station and gait were normal.  Romberg's sign was normal.  Able to perform tandem gait well. Psych:  Normal affect.  Assessment:  The primary encounter diagnosis was HTN (hypertension). Diagnoses of Headache, Dental infection, and Hyperlipidemia were also pertinent to this visit.  Plan:   1.  The following meds were prescribed:   Discharge Medication List as of 09/26/2012 12:51 PM    START taking these medications   Details  !! lisinopril (PRINIVIL,ZESTRIL) 10 MG tablet Take 1 tablet (10 mg total) by mouth daily., Starting 09/26/2012, Until Discontinued, Normal    !! penicillin v potassium (VEETID) 500 MG tablet Take 1 tablet (500 mg total) by mouth 4 (four) times daily., Starting 09/26/2012, Until Discontinued, Normal    !! traMADol (ULTRAM) 50 MG tablet Take 2 tablets (100 mg total) by mouth every 8 (eight)  hours as needed for pain., Starting 09/26/2012, Until Discontinued, Normal    !! lisinopril (PRINIVIL,ZESTRIL) 10 MG tablet Take 1 tablet (10 mg total) by mouth daily., Starting 09/26/2012, Until Discontinued, Normal    !! penicillin v potassium (VEETID) 500 MG tablet Take 1 tablet (500 mg total) by mouth 4 (four) times daily., Starting 09/26/2012, Until  Discontinued, Normal    !! traMADol (ULTRAM) 50 MG tablet Take 2 tablets (100 mg total) by mouth every 8 (eight) hours as needed for pain., Starting 09/26/2012, Until Discontinued, Normal     !! - Potential duplicate medications found. Please discuss with provider.     2.  The patient was instructed in symptomatic care and handouts were given. 3.  The patient was told to return if becoming worse in any way, if no better in 3 or 4 days, and given some red flag symptoms such as fever, stiff neck, or changing neurological symptoms that would indicate earlier return.   Reuben Likes, MD 09/26/12 2142

## 2012-09-26 NOTE — ED Notes (Signed)
Pt  Reports  A  Headache  For  3  Days   He  Is  Out of  His  bp  meds     X  sev  Weeks   He is  Awake  And  Alert and  Oriented

## 2012-10-05 ENCOUNTER — Emergency Department (INDEPENDENT_AMBULATORY_CARE_PROVIDER_SITE_OTHER)
Admission: EM | Admit: 2012-10-05 | Discharge: 2012-10-05 | Disposition: A | Discharge status: Home Health | Payer: No Typology Code available for payment source | Source: Home / Self Care

## 2012-10-05 ENCOUNTER — Encounter (HOSPITAL_COMMUNITY): Payer: Self-pay

## 2012-10-05 DIAGNOSIS — I10 Essential (primary) hypertension: Secondary | ICD-10-CM

## 2012-10-05 DIAGNOSIS — B182 Chronic viral hepatitis C: Secondary | ICD-10-CM

## 2012-10-05 MED ORDER — PRAVASTATIN SODIUM 20 MG PO TABS: 20.0000 mg | ORAL_TABLET | Freq: Every day | ORAL | Status: DC

## 2012-10-05 MED ORDER — TRAMADOL HCL 50 MG PO TABS: 100.0000 mg | ORAL_TABLET | Freq: Three times a day (TID) | ORAL | Status: DC | PRN

## 2012-10-05 NOTE — Progress Notes (Signed)
Patient Demographics  [name], is a 47 y.o. male  FAO:[id number]  ONG:[id number]  DOB - Feb 28, 1966  Chief Complaint  Patient presents with  . Dental Pain        Subjective:   Chad Wolfe today is here for a follow up visit. He has been having left upper 3rd molar pain for the past few weeks, worsening recently. He however has no headache, No chest pain, No abdominal pain - No Nausea, No new weakness tingling or numbness, No Cough - SOB.   Objective:    Filed Vitals:   10/05/12 1318  BP: 125/85  Pulse: 65  Temp: 98 F (36.7 C)  TempSrc: Oral  Resp: 16  SpO2: 98%     ALLERGIES:   Allergies  Allergen Reactions  . Ibuprofen     REACTION: stomach upset    PAST MEDICAL HISTORY: Past Medical History  Diagnosis Date  . Hypertension   . Hyperlipidemia     MEDICATIONS AT HOME: Prior to Admission medications   Medication Sig Start Date End Date Taking? Authorizing Provider  lisinopril (PRINIVIL,ZESTRIL) 10 MG tablet Take 1 tablet (10 mg total) by mouth daily. 09/26/12   Reuben Likes, MD  pravastatin (PRAVACHOL) 20 MG tablet Take 1 tablet (20 mg total) by mouth daily. 10/05/12   Hakeen Shipes Levora Dredge, MD  traMADol (ULTRAM) 50 MG tablet Take 2 tablets (100 mg total) by mouth every 8 (eight) hours as needed for pain. 10/05/12   Mistey Hoffert Levora Dredge, MD     Exam  General appearance :Awake, alert, not in any distress. Speech Clear. Not toxic Looking HEENT: Atraumatic and Normocephalic, pupils equally reactive to light and accomodation Oral-Caries at left 3 rd molar Neck: supple, no JVD. No cervical lymphadenopathy.  Chest:Good air entry bilaterally, no added sounds  CVS: S1 S2 regular, no murmurs.  Abdomen: Bowel sounds present, Non tender and not distended with no gaurding, rigidity or rebound. Extremities: B/L Lower Ext shows no edema, both legs are warm to touch Neurology: Awake alert, and oriented X 3, CN II-XII intact, Non focal Skin:No  Rash Wounds:N/A    Data Review   CBC No results found for this basename: WBC, HGB, HCT, PLT, MCV, MCH, MCHC, RDW, NEUTRABS, LYMPHSABS, MONOABS, EOSABS, BASOSABS, BANDABS, BANDSABD,  in the last 168 hours  Chemistries   No results found for this basename: NA, K, CL, CO2, GLUCOSE, BUN, CREATININE, GFRCGP, CALCIUM, MG, AST, ALT, ALKPHOS, BILITOT,  in the last 168 hours ------------------------------------------------------------------------------------------------------------------ No results found for this basename: HGBA1C,  in the last 72 hours ------------------------------------------------------------------------------------------------------------------ No results found for this basename: CHOL, HDL, LDLCALC, TRIG, CHOLHDL, LDLDIRECT,  in the last 72 hours ------------------------------------------------------------------------------------------------------------------ No results found for this basename: TSH, T4TOTAL, FREET3, T3FREE, THYROIDAB,  in the last 72 hours ------------------------------------------------------------------------------------------------------------------ No results found for this basename: VITAMINB12, FOLATE, FERRITIN, TIBC, IRON, RETICCTPCT,  in the last 72 hours  Coagulation profile  No results found for this basename: INR, PROTIME,  in the last 168 hours    Assessment & Plan   Active Problems: Acute dental pain -suspected 2/2 dental caries at 3 rd molar-needs referral to Dentist-RN and secretary aware  HTN -Controlled -c/w current meds  Dyslipidemia -c/w Statins  Hep C -per Liver clinic  Follow up in 1 month or sooner if needed. Check CMET at next visit-have ordered this visit    Follow-up Information   Follow up with [facility]. Schedule an appointment as soon as possible for a visit in 1 month.

## 2012-10-05 NOTE — ED Notes (Signed)
Referral faxed to [facility] 

## 2012-10-05 NOTE — ED Notes (Signed)
Patient complains of toothache Will need referral to dentist

## 2012-11-27 ENCOUNTER — Encounter (HOSPITAL_COMMUNITY): Payer: Self-pay

## 2012-11-27 DIAGNOSIS — F1110 Opioid abuse, uncomplicated: Secondary | ICD-10-CM | POA: Insufficient documentation

## 2012-11-27 DIAGNOSIS — Z008 Encounter for other general examination: Secondary | ICD-10-CM | POA: Insufficient documentation

## 2012-11-27 DIAGNOSIS — I10 Essential (primary) hypertension: Secondary | ICD-10-CM | POA: Insufficient documentation

## 2012-11-27 LAB — COMPREHENSIVE METABOLIC PANEL WITH GFR
ALT: 70 U/L — ABNORMAL HIGH (ref 0–53)
AST: 70 U/L — ABNORMAL HIGH (ref 0–37)
Albumin: 3.7 g/dL (ref 3.5–5.2)
Alkaline Phosphatase: 76 U/L (ref 39–117)
BUN: 7 mg/dL (ref 6–23)
CO2: 26 meq/L (ref 19–32)
Calcium: 8.4 mg/dL (ref 8.4–10.5)
Chloride: 105 meq/L (ref 96–112)
Creatinine, Ser: 0.96 mg/dL (ref 0.50–1.35)
GFR calc Af Amer: >90 mL/min
GFR calc non Af Amer: >90 mL/min
Glucose, Bld: 126 mg/dL — ABNORMAL HIGH (ref 70–99)
Potassium: 4 meq/L (ref 3.5–5.1)
Sodium: 142 meq/L (ref 135–145)
Total Bilirubin: 0.7 mg/dL (ref 0.3–1.2)
Total Protein: 7.8 g/dL (ref 6.0–8.3)

## 2012-11-27 LAB — CBC WITH DIFFERENTIAL/PLATELET
Basophils Absolute: 0 10*3/uL (ref 0.0–0.1)
Basophils Relative: 0 % (ref 0–1)
Eosinophils Absolute: 0 10*3/uL (ref 0.0–0.7)
Eosinophils Relative: 0 % (ref 0–5)
HCT: 46.2 % (ref 39.0–52.0)
Hemoglobin: 17.1 g/dL — ABNORMAL HIGH (ref 13.0–17.0)
Lymphocytes Relative: 39 % (ref 12–46)
Lymphs Abs: 2.1 10*3/uL (ref 0.7–4.0)
MCH: 34.6 pg — ABNORMAL HIGH (ref 26.0–34.0)
MCHC: 37 g/dL — ABNORMAL HIGH (ref 30.0–36.0)
MCV: 93.5 fL (ref 78.0–100.0)
Monocytes Absolute: 0.6 10*3/uL (ref 0.1–1.0)
Monocytes Relative: 10 % (ref 3–12)
Neutro Abs: 2.8 10*3/uL (ref 1.7–7.7)
Neutrophils Relative %: 51 % (ref 43–77)
Platelets: 190 10*3/uL (ref 150–400)
RBC: 4.94 MIL/uL (ref 4.22–5.81)
RDW: 12 % (ref 11.5–15.5)
WBC: 5.5 10*3/uL (ref 4.0–10.5)

## 2012-11-27 LAB — ETHANOL: Alcohol, Ethyl (B): 315 mg/dL — ABNORMAL HIGH (ref 0–11)

## 2012-11-27 NOTE — ED Notes (Signed)
Pt reports he started back drinking 3 weeks ago

## 2012-11-27 NOTE — ED Notes (Signed)
Chad Wolfe [phone]. Says to call so she can pick him up

## 2012-11-27 NOTE — ED Notes (Signed)
Pt verbalizes he does NOT want his spouse to know why he is here

## 2012-11-27 NOTE — ED Notes (Signed)
Pt reports he does not feel good, when asked what symptoms he was having pt states "I'm not myself." Pt states "I have been taking pills, drinking, and I have Hepatitis C." pt feels the pills "keep him accurate." Pt admits to using 1 pill of Percocet or Oxycodone every day. Pt states "I'm not addicted to them, I only take 1 a day, I can quit any time I want. But, I have to take them to be accurate." Pt reports he just started drinking ETOH today to help him relieve the pain, states he "needs the pills to help with the pain, he was unable to find any on the streets today." Pt does not want his spouse to know about him taking opiates, pt states "he does not need detox from opiates." Pt also states the ETOH made him sick today, states he never drinks. Pt is requesting some assistance to get off of opiates but does not feel like he needs detox.

## 2012-11-28 ENCOUNTER — Emergency Department (HOSPITAL_COMMUNITY)
Admission: EM | Admit: 2012-11-28 | Discharge: 2012-11-28 | Discharge status: Against Medical Advice | Payer: No Typology Code available for payment source | Attending: Emergency Medicine | Admitting: Emergency Medicine

## 2012-11-28 ENCOUNTER — Encounter (HOSPITAL_COMMUNITY): Payer: Self-pay | Admitting: *Deleted

## 2012-11-28 ENCOUNTER — Emergency Department (HOSPITAL_COMMUNITY)
Admission: EM | Admit: 2012-11-28 | Discharge: 2012-11-28 | Disposition: A | Discharge status: Home / Self Care | Payer: No Typology Code available for payment source | Attending: Emergency Medicine | Admitting: Emergency Medicine

## 2012-11-28 DIAGNOSIS — Z79899 Other long term (current) drug therapy: Secondary | ICD-10-CM | POA: Insufficient documentation

## 2012-11-28 DIAGNOSIS — Z8639 Personal history of other endocrine, nutritional and metabolic disease: Secondary | ICD-10-CM | POA: Insufficient documentation

## 2012-11-28 DIAGNOSIS — E785 Hyperlipidemia, unspecified: Secondary | ICD-10-CM | POA: Insufficient documentation

## 2012-11-28 DIAGNOSIS — Z8619 Personal history of other infectious and parasitic diseases: Secondary | ICD-10-CM | POA: Insufficient documentation

## 2012-11-28 DIAGNOSIS — K2970 Gastritis, unspecified, without bleeding: Secondary | ICD-10-CM | POA: Insufficient documentation

## 2012-11-28 DIAGNOSIS — Z8719 Personal history of other diseases of the digestive system: Secondary | ICD-10-CM | POA: Insufficient documentation

## 2012-11-28 DIAGNOSIS — R1013 Epigastric pain: Secondary | ICD-10-CM | POA: Insufficient documentation

## 2012-11-28 DIAGNOSIS — Z862 Personal history of diseases of the blood and blood-forming organs and certain disorders involving the immune mechanism: Secondary | ICD-10-CM | POA: Insufficient documentation

## 2012-11-28 DIAGNOSIS — I10 Essential (primary) hypertension: Secondary | ICD-10-CM | POA: Insufficient documentation

## 2012-11-28 DIAGNOSIS — R630 Anorexia: Secondary | ICD-10-CM | POA: Insufficient documentation

## 2012-11-28 HISTORY — DX: Unspecified viral hepatitis C without hepatic coma: B19.20

## 2012-11-28 HISTORY — DX: Gastric ulcer, unspecified as acute or chronic, without hemorrhage or perforation: K25.9

## 2012-11-28 LAB — URINALYSIS, ROUTINE W REFLEX MICROSCOPIC
Hgb urine dipstick: NEGATIVE
Nitrite: NEGATIVE
Specific Gravity, Urine: 1.027 (ref 1.005–1.030)
Urobilinogen, UA: 1 mg/dL (ref 0.0–1.0)
pH: 6 (ref 5.0–8.0)

## 2012-11-28 LAB — POCT I-STAT TROPONIN I: Troponin i, poc: 0 ng/mL (ref 0.00–0.08)

## 2012-11-28 LAB — LIPASE, BLOOD: Lipase: 28 U/L (ref 11–59)

## 2012-11-28 MED ORDER — SUCRALFATE 1 G PO TABS: 1.0000 g | ORAL_TABLET | Freq: Four times a day (QID) | ORAL | Status: DC

## 2012-11-28 MED ORDER — OMEPRAZOLE 20 MG PO CPDR: DELAYED_RELEASE_CAPSULE | ORAL | Status: DC

## 2012-11-28 MED ORDER — FAMOTIDINE 20 MG PO TABS
40.0000 mg | ORAL_TABLET | Freq: Once | ORAL | Status: AC
  Administered 2012-11-28: 40 mg via ORAL
  Filled 2012-11-28: qty 2

## 2012-11-28 MED ORDER — FAMOTIDINE 20 MG PO TABS: 20.0000 mg | ORAL_TABLET | Freq: Two times a day (BID) | ORAL | Status: DC

## 2012-11-28 MED ORDER — GI COCKTAIL ~~LOC~~
30.0000 mL | Freq: Once | ORAL | Status: AC
  Administered 2012-11-28: 30 mL via ORAL
  Filled 2012-11-28: qty 30

## 2012-11-28 NOTE — ED Notes (Signed)
Pt discharged.Vital signs stable and GCS 15 

## 2012-11-28 NOTE — ED Provider Notes (Signed)
History    CSN: [id number] Arrival date & time 11/28/12  4098  First MD Initiated Contact with Patient 11/28/12 1128     Chief Complaint  Patient presents with  . Fatigue   (Consider location/radiation/quality/duration/timing/severity/associated sxs/prior Treatment) HPI Comments: Patient with history of hepatitis presents with chief complaint of epigastric abdominal pain that began 2 days ago. Patient states that's he has been a heavy drinker in the past and started drinking heavily again 2 days ago. He also states it is very fatigued during this time. Patient also admits to taking Percocet daily and last took this 3 days ago. He is concerned that he is having withdrawal effects. Pain is dull. It is not worse with food. It does not radiate. Patient has not had vomiting or diarrhea, just decreased appetite. No urinary changes. Patient has used "everything" to treat the pain including Alka-Seltzer, Prilosec, laxative. Laxative gave some relief but patient denies being constipated. Onset of symptoms gradual. Course is constant. Nothing makes symptoms worse.  The history is provided by the patient and medical records.   Past Medical History  Diagnosis Date  . Hypertension   . Hyperlipidemia   . Gastric ulcer   . Hepatitis C    Past Surgical History  Procedure Laterality Date  . Stomach surgery     Family History  Problem Relation Age of Onset  . Diabetes Father   . Hypertension Father   . Diabetes Sister   . Hypertension Sister   . Diabetes Brother   . Hypertension Brother    History  Substance Use Topics  . Smoking status: Never Smoker   . Smokeless tobacco: Not on file  . Alcohol Use: No    Review of Systems  Constitutional: Positive for appetite change and fatigue. Negative for fever.  HENT: Negative for sore throat and rhinorrhea.   Eyes: Negative for redness.  Respiratory: Negative for cough.   Cardiovascular: Negative for chest pain.  Gastrointestinal: Positive  for abdominal pain. Negative for nausea, vomiting and diarrhea.  Genitourinary: Negative for dysuria.  Musculoskeletal: Negative for myalgias.  Skin: Negative for rash.  Neurological: Negative for headaches.    Allergies  Ibuprofen  Home Medications   Current Outpatient Rx  Name  Route  Sig  Dispense  Refill  . aspirin-sod bicarb-citric acid (ALKA-SELTZER) 325 MG TBEF   Oral   Take 325 mg by mouth every 6 (six) hours as needed (stomache pain).         Marland Kitchen ibuprofen (ADVIL,MOTRIN) 200 MG tablet   Oral   Take 400 mg by mouth as needed for pain.         Marland Kitchen lisinopril (PRINIVIL,ZESTRIL) 10 MG tablet   Oral   Take 1 tablet (10 mg total) by mouth daily.   30 tablet   2   . Multiple Vitamins-Minerals (MENS MULTI VITAMIN & MINERAL PO)   Oral   Take 1 tablet by mouth daily.         Marland Kitchen omeprazole (PRILOSEC) 20 MG capsule   Oral   Take 20 mg by mouth daily.         Marland Kitchen oxyCODONE-acetaminophen (PERCOCET) 10-325 MG per tablet   Oral   Take 1 tablet by mouth every 4 (four) hours as needed for pain.          BP 113/74  Pulse 85  Temp(Src) 98.3 F (36.8 C) (Oral)  Resp 20  SpO2 95% Physical Exam  Nursing note and vitals reviewed. Constitutional: He appears well-developed  and well-nourished.  HENT:  Head: Normocephalic and atraumatic.  Eyes: Conjunctivae are normal. Right eye exhibits no discharge. Left eye exhibits no discharge.  Neck: Normal range of motion. Neck supple.  Cardiovascular: Normal rate, regular rhythm and normal heart sounds.   Pulmonary/Chest: Effort normal and breath sounds normal.  Abdominal: Soft. Bowel sounds are normal. There is tenderness (mild) in the epigastric area. There is no rigidity, no rebound, no guarding, no CVA tenderness, no tenderness at McBurney's point and negative Murphy's sign.  Neurological: He is alert.  Skin: Skin is warm and dry.  Psychiatric: He has a normal mood and affect.    ED Course  Procedures (including critical  care time) Labs Reviewed  URINALYSIS, ROUTINE W REFLEX MICROSCOPIC - Abnormal; Notable for the following:    Glucose, UA 100 (*)    Ketones, ur 15 (*)    All other components within normal limits  LIPASE, BLOOD  POCT I-STAT TROPONIN I   No results found. 1. Gastritis     12:14 PM Patient seen and examined. Work-up reviewed. Medications ordered.   Vital signs reviewed and are as follows: Filed Vitals:   11/28/12 0949  BP: 113/74  Pulse: 85  Temp: 98.3 F (36.8 C)  Resp: 20   No evidence of pancreatitis. CBC performed yesterday showed mild polycythemia. Do not suspect significant dehydration as patient is continuing to drink fluids. Slight transaminitis consistent with hepatitis C. Labs are otherwise reassuring. Will give symptomatic treatment and reassess.   Date: 11/28/2012  Rate: 88  Rhythm: normal sinus  QRS Axis: normal  Intervals: normal  ST/T Wave abnormalities: normal  Conduction Disutrbances:none  Narrative Interpretation:   Old EKG Reviewed: none available  1:20 PM On-rexam, after GI cocktail, pain resolved and patient is feeling much better.   Will discharge to home with omeprazole, pepcid, carafate.   [facility] referral given for substance abuse issues. Patient is not here for detox.   The patient was urged to return to the Emergency Department immediately with worsening of current symptoms, worsening abdominal pain, persistent vomiting, blood noted in stools, fever, or any other concerns. The patient verbalized understanding.    MDM  Patient with epigastric pain with reassuring labs in setting of recent abx use and d/c of percocet. Symptoms well controlled in ED. Do not feel patient is significantly dehydrated. He is tolerating PO's. Do not feel he needs admission to the hospital and can be safely treated as outpatient.    Renne Crigler, PA-C 11/28/12 1325

## 2012-11-28 NOTE — ED Notes (Signed)
Pt presents to ED with feeling tired and weak after drinking the whole night.Pt says he had lots to drink  And has hepatitis C.

## 2012-11-28 NOTE — ED Provider Notes (Signed)
Medical screening examination/treatment/procedure(s) were performed by non-physician practitioner and as supervising physician I was immediately available for consultation/collaboration.   Glynn Octave, MD 11/28/12 [phone]

## 2012-11-28 NOTE — ED Notes (Signed)
Pt states that he is weak all over for the last 3-4 days.  Pt states he came off percocets 2 days ago and then got some bad news and started drinking heavy.  Sunday no appetite.  Pt is complaining of mid upper abdominal pain and headache

## 2012-11-30 ENCOUNTER — Emergency Department (HOSPITAL_COMMUNITY)
Admission: EM | Admit: 2012-11-30 | Discharge: 2012-11-30 | Disposition: A | Discharge status: Home / Self Care | Payer: No Typology Code available for payment source | Attending: Emergency Medicine | Admitting: Emergency Medicine

## 2012-11-30 ENCOUNTER — Emergency Department (HOSPITAL_COMMUNITY): Payer: No Typology Code available for payment source

## 2012-11-30 ENCOUNTER — Encounter (HOSPITAL_COMMUNITY): Payer: Self-pay | Admitting: *Deleted

## 2012-11-30 DIAGNOSIS — S022XXB Fracture of nasal bones, initial encounter for open fracture: Secondary | ICD-10-CM | POA: Insufficient documentation

## 2012-11-30 DIAGNOSIS — S01501A Unspecified open wound of lip, initial encounter: Secondary | ICD-10-CM | POA: Insufficient documentation

## 2012-11-30 DIAGNOSIS — R040 Epistaxis: Secondary | ICD-10-CM | POA: Insufficient documentation

## 2012-11-30 DIAGNOSIS — IMO0002 Reserved for concepts with insufficient information to code with codable children: Secondary | ICD-10-CM | POA: Insufficient documentation

## 2012-11-30 DIAGNOSIS — Z862 Personal history of diseases of the blood and blood-forming organs and certain disorders involving the immune mechanism: Secondary | ICD-10-CM | POA: Insufficient documentation

## 2012-11-30 DIAGNOSIS — Z8719 Personal history of other diseases of the digestive system: Secondary | ICD-10-CM | POA: Insufficient documentation

## 2012-11-30 DIAGNOSIS — Z8639 Personal history of other endocrine, nutritional and metabolic disease: Secondary | ICD-10-CM | POA: Insufficient documentation

## 2012-11-30 DIAGNOSIS — S0180XA Unspecified open wound of other part of head, initial encounter: Secondary | ICD-10-CM | POA: Insufficient documentation

## 2012-11-30 DIAGNOSIS — S0181XA Laceration without foreign body of other part of head, initial encounter: Secondary | ICD-10-CM

## 2012-11-30 DIAGNOSIS — I10 Essential (primary) hypertension: Secondary | ICD-10-CM | POA: Insufficient documentation

## 2012-11-30 DIAGNOSIS — Z79899 Other long term (current) drug therapy: Secondary | ICD-10-CM | POA: Insufficient documentation

## 2012-11-30 DIAGNOSIS — Z8619 Personal history of other infectious and parasitic diseases: Secondary | ICD-10-CM | POA: Insufficient documentation

## 2012-11-30 MED ORDER — HYDROCODONE-ACETAMINOPHEN 5-325 MG PO TABS: 1.0000 | ORAL_TABLET | Freq: Four times a day (QID) | ORAL | Status: DC | PRN

## 2012-11-30 MED ORDER — AMOXICILLIN-POT CLAVULANATE 875-125 MG PO TABS: 1.0000 | ORAL_TABLET | Freq: Two times a day (BID) | ORAL | Status: DC

## 2012-11-30 MED ORDER — LIDOCAINE-EPINEPHRINE (PF) 2 %-1:200000 IJ SOLN
20.0000 mL | Freq: Once | INTRAMUSCULAR | Status: DC
  Filled 2012-11-30: qty 20

## 2012-11-30 MED ORDER — AMOXICILLIN-POT CLAVULANATE 875-125 MG PO TABS
1.0000 | ORAL_TABLET | Freq: Once | ORAL | Status: AC
  Administered 2012-11-30: 1 via ORAL
  Filled 2012-11-30: qty 1

## 2012-11-30 MED ORDER — HYDROCODONE-ACETAMINOPHEN 5-325 MG PO TABS
1.0000 | ORAL_TABLET | Freq: Once | ORAL | Status: AC
  Administered 2012-11-30: 1 via ORAL
  Filled 2012-11-30: qty 1

## 2012-11-30 NOTE — ED Notes (Signed)
Physician at bedside suturing pt.

## 2012-11-30 NOTE — ED Notes (Signed)
MD at bedside. 

## 2012-11-30 NOTE — ED Provider Notes (Signed)
Medical screening examination/treatment/procedure(s) were performed by non-physician practitioner and as supervising physician I was immediately available for consultation/collaboration.   Gilda Crease, MD 11/30/12 [phone]
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
## 2012-12-05 ENCOUNTER — Encounter (HOSPITAL_COMMUNITY): Payer: Self-pay | Admitting: *Deleted

## 2012-12-05 ENCOUNTER — Emergency Department (HOSPITAL_COMMUNITY)
Admission: EM | Admit: 2012-12-05 | Discharge: 2012-12-05 | Disposition: A | Discharge status: Home / Self Care | Payer: No Typology Code available for payment source | Attending: Emergency Medicine | Admitting: Emergency Medicine

## 2012-12-05 DIAGNOSIS — Z87891 Personal history of nicotine dependence: Secondary | ICD-10-CM | POA: Insufficient documentation

## 2012-12-05 DIAGNOSIS — Z862 Personal history of diseases of the blood and blood-forming organs and certain disorders involving the immune mechanism: Secondary | ICD-10-CM | POA: Insufficient documentation

## 2012-12-05 DIAGNOSIS — G8911 Acute pain due to trauma: Secondary | ICD-10-CM | POA: Insufficient documentation

## 2012-12-05 DIAGNOSIS — Z4802 Encounter for removal of sutures: Secondary | ICD-10-CM | POA: Insufficient documentation

## 2012-12-05 DIAGNOSIS — Z8719 Personal history of other diseases of the digestive system: Secondary | ICD-10-CM | POA: Insufficient documentation

## 2012-12-05 DIAGNOSIS — Z8639 Personal history of other endocrine, nutritional and metabolic disease: Secondary | ICD-10-CM | POA: Insufficient documentation

## 2012-12-05 DIAGNOSIS — I10 Essential (primary) hypertension: Secondary | ICD-10-CM | POA: Insufficient documentation

## 2012-12-05 DIAGNOSIS — S022XXS Fracture of nasal bones, sequela: Secondary | ICD-10-CM

## 2012-12-05 DIAGNOSIS — R51 Headache: Secondary | ICD-10-CM | POA: Insufficient documentation

## 2012-12-05 DIAGNOSIS — Z8619 Personal history of other infectious and parasitic diseases: Secondary | ICD-10-CM | POA: Insufficient documentation

## 2012-12-05 DIAGNOSIS — Z79899 Other long term (current) drug therapy: Secondary | ICD-10-CM | POA: Insufficient documentation

## 2012-12-05 MED ORDER — HYDROCODONE-ACETAMINOPHEN 5-325 MG PO TABS: 1.0000 | ORAL_TABLET | ORAL | Status: DC | PRN

## 2012-12-05 NOTE — ED Notes (Signed)
Pt sates that he is here to have sutures removed to nose and chin placed last Wednesday.  Needs pain med renewal and concerned about getting diarrhea right after he takes his abx.  No abdominal pain
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
## 2012-12-05 NOTE — ED Notes (Signed)
Sutures removed from bridge of nose, lower lip area--

## 2013-03-19 ENCOUNTER — Encounter (HOSPITAL_COMMUNITY): Payer: Self-pay | Admitting: Emergency Medicine

## 2013-03-19 ENCOUNTER — Emergency Department (HOSPITAL_COMMUNITY)
Admission: EM | Admit: 2013-03-19 | Discharge: 2013-03-19 | Discharge status: Against Medical Advice | Payer: No Typology Code available for payment source | Attending: Emergency Medicine | Admitting: Emergency Medicine

## 2013-03-19 DIAGNOSIS — Z8619 Personal history of other infectious and parasitic diseases: Secondary | ICD-10-CM | POA: Insufficient documentation

## 2013-03-19 DIAGNOSIS — Z8639 Personal history of other endocrine, nutritional and metabolic disease: Secondary | ICD-10-CM | POA: Insufficient documentation

## 2013-03-19 DIAGNOSIS — Z79899 Other long term (current) drug therapy: Secondary | ICD-10-CM | POA: Insufficient documentation

## 2013-03-19 DIAGNOSIS — I10 Essential (primary) hypertension: Secondary | ICD-10-CM | POA: Insufficient documentation

## 2013-03-19 DIAGNOSIS — Z862 Personal history of diseases of the blood and blood-forming organs and certain disorders involving the immune mechanism: Secondary | ICD-10-CM | POA: Insufficient documentation

## 2013-03-19 DIAGNOSIS — Z87891 Personal history of nicotine dependence: Secondary | ICD-10-CM | POA: Insufficient documentation

## 2013-03-19 DIAGNOSIS — K089 Disorder of teeth and supporting structures, unspecified: Secondary | ICD-10-CM | POA: Insufficient documentation

## 2013-03-19 DIAGNOSIS — R51 Headache: Secondary | ICD-10-CM | POA: Insufficient documentation

## 2013-03-19 DIAGNOSIS — Z8711 Personal history of peptic ulcer disease: Secondary | ICD-10-CM | POA: Insufficient documentation

## 2013-03-19 MED ORDER — METOCLOPRAMIDE HCL 5 MG/ML IJ SOLN
10.0000 mg | Freq: Once | INTRAMUSCULAR | Status: AC
  Administered 2013-03-19: 10 mg via INTRAVENOUS
  Filled 2013-03-19: qty 2

## 2013-03-19 MED ORDER — DIPHENHYDRAMINE HCL 50 MG/ML IJ SOLN
25.0000 mg | Freq: Once | INTRAMUSCULAR | Status: AC
  Administered 2013-03-19: 25 mg via INTRAVENOUS
  Filled 2013-03-19: qty 1

## 2013-03-19 MED ORDER — SODIUM CHLORIDE 0.9 % IV BOLUS (SEPSIS)
1000.0000 mL | Freq: Once | INTRAVENOUS | Status: AC
  Administered 2013-03-19: 1000 mL via INTRAVENOUS
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
## 2013-03-19 NOTE — ED Notes (Signed)
Pt. is agitated and diaphoretic. He states, "I want to go home." [employer] applied and nurse notified.
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
## 2013-03-19 NOTE — Discharge Planning (Signed)
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 Patient was educated on how to properly use his orange card. Patient was also made aware to utilize his primary care or urgent care first for non urgent matters. Follow up appointment was made with PCP for April 04, 2013 at 3:15.
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
## 2013-03-20 NOTE — ED Provider Notes (Signed)
Medical screening examination/treatment/procedure(s) were performed by non-physician practitioner and as supervising physician I was immediately available for consultation/collaboration.  Raeford Razor, MD 03/20/13 [phone]

## 2013-03-27 ENCOUNTER — Emergency Department (INDEPENDENT_AMBULATORY_CARE_PROVIDER_SITE_OTHER)
Admission: EM | Admit: 2013-03-27 | Discharge: 2013-03-27 | Disposition: A | Discharge status: Home / Self Care | Payer: No Typology Code available for payment source | Source: Home / Self Care | Attending: Family Medicine | Admitting: Family Medicine

## 2013-03-27 ENCOUNTER — Encounter (HOSPITAL_COMMUNITY): Payer: Self-pay | Admitting: Emergency Medicine

## 2013-03-27 DIAGNOSIS — I10 Essential (primary) hypertension: Secondary | ICD-10-CM

## 2013-03-27 DIAGNOSIS — K0889 Other specified disorders of teeth and supporting structures: Secondary | ICD-10-CM | POA: Diagnosis present

## 2013-03-27 DIAGNOSIS — K089 Disorder of teeth and supporting structures, unspecified: Secondary | ICD-10-CM

## 2013-03-27 MED ORDER — LISINOPRIL 10 MG PO TABS: 10.0000 mg | ORAL_TABLET | Freq: Every day | ORAL | Status: DC

## 2013-03-27 MED ORDER — HYDROCODONE-ACETAMINOPHEN 5-325 MG PO TABS: 1.0000 | ORAL_TABLET | Freq: Four times a day (QID) | ORAL | Status: DC | PRN

## 2013-03-27 MED ORDER — AMOXICILLIN 500 MG PO CAPS: 500.0000 mg | ORAL_CAPSULE | Freq: Three times a day (TID) | ORAL | Status: DC

## 2013-03-27 NOTE — ED Notes (Signed)
C/o medication refill on bp med and wants pain medication for dental pain.  Patient states he has appt 11/18 with dentist.

## 2013-03-27 NOTE — ED Provider Notes (Signed)
Chad Wolfe is a 47 y.o. male who presents to Urgent Care today for upper left ankle pain. This is been going on for several months. He is an appointment with his dentist on November 18. His pain is moderate to severe. This pain worsened recently when the tooth broke apart. No fevers or chills nausea vomiting or diarrhea. He is using ibuprofen which helped only a little.  Additionally patient has run out of his lisinopril and does not yet have an [facility].   Past Medical History  Diagnosis Date  . Hypertension   . Hyperlipidemia   . Gastric ulcer   . Hepatitis C    History  Substance Use Topics  . Smoking status: Former Games developer  . Smokeless tobacco: Not on file  . Alcohol Use: No   ROS as above Medications reviewed. No current facility-administered medications for this encounter.   Current Outpatient Prescriptions  Medication Sig Dispense Refill  . amoxicillin (AMOXIL) 500 MG capsule Take 1 capsule (500 mg total) by mouth 3 (three) times daily.  60 capsule  0  . aspirin-sod bicarb-citric acid (ALKA-SELTZER) 325 MG TBEF Take 325 mg by mouth every 6 (six) hours as needed (stomache pain).      Marland Kitchen HYDROcodone-acetaminophen (NORCO/VICODIN) 5-325 MG per tablet Take 1 tablet by mouth every 6 (six) hours as needed for pain.  20 tablet  0  . ibuprofen (ADVIL,MOTRIN) 200 MG tablet Take 800 mg by mouth every 8 (eight) hours as needed for headache.       . lisinopril (PRINIVIL,ZESTRIL) 10 MG tablet Take 1 tablet (10 mg total) by mouth daily.  30 tablet  1  . Multiple Vitamins-Minerals (MENS MULTI VITAMIN & MINERAL PO) Take 1 tablet by mouth daily.        Exam:  BP 123/74  Pulse 73  Temp(Src) 97.9 F (36.6 C) (Oral)  Resp 18  SpO2 96% Gen: Well NAD HEENT: EOMI,  MMM, upper left rear most tooth broken and erythema at the gumline tender to touch. Lungs: CTABL Nl WOB Heart: RRR no MRG Abd: NABS, NT, ND Exts: Non edematous BL  LE, warm  and well perfused.    Dental injection: upper left rear most tooth  Consent obtained Topical numbing medicine applied to the base of the tooth 1.8 mL of Marcaine and epinephrine were injected into the base of the tooth at the junction of the gum and cheek achieving good anesthesia. Patient tolerated procedure well. Patient had considerable improvement in symptoms following a Marcaine injection  Assessment and Plan: 47 y.o. male with  1) dental pain. Patient is an appointment with a dentist next month. Digital injection helped a bit. Additionally we'll prescribe small amount of Norco and amoxicillin. Followup with dentist 2) hypertension. Refill lisinopril. [facility] Discussed warning signs or symptoms. Please see discharge instructions. Patient expresses understanding.     Rodolph Bong, MD 03/27/13 [phone]

## 2013-04-04 ENCOUNTER — Ambulatory Visit: Payer: No Typology Code available for payment source

## 2013-05-02 ENCOUNTER — Ambulatory Visit: Payer: Self-pay

## 2013-05-22 ENCOUNTER — Ambulatory Visit: Payer: Self-pay | Admitting: Internal Medicine

## 2013-06-11 ENCOUNTER — Encounter (HOSPITAL_COMMUNITY): Payer: Self-pay | Admitting: Emergency Medicine

## 2013-06-11 ENCOUNTER — Emergency Department (INDEPENDENT_AMBULATORY_CARE_PROVIDER_SITE_OTHER)
Admission: EM | Admit: 2013-06-11 | Discharge: 2013-06-11 | Disposition: A | Discharge status: Home / Self Care | Payer: PRIVATE HEALTH INSURANCE | Source: Home / Self Care | Attending: Emergency Medicine | Admitting: Emergency Medicine

## 2013-06-11 DIAGNOSIS — K029 Dental caries, unspecified: Secondary | ICD-10-CM

## 2013-06-11 DIAGNOSIS — K089 Disorder of teeth and supporting structures, unspecified: Secondary | ICD-10-CM

## 2013-06-11 DIAGNOSIS — I10 Essential (primary) hypertension: Secondary | ICD-10-CM

## 2013-06-11 DIAGNOSIS — K0889 Other specified disorders of teeth and supporting structures: Secondary | ICD-10-CM

## 2013-06-11 LAB — POCT I-STAT, CHEM 8
BUN: 11 mg/dL (ref 6–23)
CALCIUM ION: 1.27 mmol/L — AB (ref 1.12–1.23)
CHLORIDE: 103 meq/L (ref 96–112)
Creatinine, Ser: 1.1 mg/dL (ref 0.50–1.35)
Glucose, Bld: 132 mg/dL — ABNORMAL HIGH (ref 70–99)
HEMATOCRIT: 56 % — AB (ref 39.0–52.0)
Hemoglobin: 19 g/dL — ABNORMAL HIGH (ref 13.0–17.0)
Potassium: 4.9 mEq/L (ref 3.7–5.3)
SODIUM: 138 meq/L (ref 137–147)
TCO2: 29 mmol/L (ref 0–100)

## 2013-06-11 MED ORDER — LISINOPRIL 10 MG PO TABS: 10.0000 mg | ORAL_TABLET | Freq: Every day | ORAL | Status: DC

## 2013-06-11 MED ORDER — ONDANSETRON 4 MG PO TBDP
ORAL_TABLET | ORAL | Status: AC
  Filled 2013-06-11: qty 1

## 2013-06-11 MED ORDER — OXYCODONE HCL 5 MG PO TABS: 5.0000 mg | ORAL_TABLET | Freq: Four times a day (QID) | ORAL | Status: DC | PRN

## 2013-06-11 MED ORDER — AMOXICILLIN 875 MG PO TABS
875.0000 mg | ORAL_TABLET | Freq: Two times a day (BID) | ORAL | Status: DC
Start: 2013-06-11 — End: 2013-08-03

## 2013-06-11 NOTE — ED Notes (Signed)
C/o medication refill on bp medication  States he has been out of meds for two days   C/o left side dental pain States he has appt with dentist on Jan. 15 to get tooth pulled States he is in pain.

## 2013-06-11 NOTE — Discharge Instructions (Signed)
Dental Caries   Dental caries (also called tooth decay) is the most common oral disease. It can occur at any age, but is more common in children and young adults.   HOW DENTAL CARIES DEVELOPS   The process of decay begins when bacteria and foods (particularly sugars and starches) combine in your mouth to produce plaque. Plaque is a substance that sticks to the hard, outer surface of a tooth (enamel). The bacteria in plaque produce acids that attack enamel. These acids may also attack the root surface of a tooth (cementum) if it is exposed. Repeated attacks dissolve these surfaces and create holes in the tooth (cavities). If left untreated, the acids destroy the other layers of the tooth.   RISK FACTORS  · Frequent sipping of sugary beverages.    · Frequent snacking on sugary and starchy foods, especially those that easily get stuck in the teeth.    · Poor oral hygiene.    · Dry mouth.    · Substance abuse such as methamphetamine abuse.    · Broken or poor-fitting dental restorations.    · Eating disorders.    · Gastroesophageal reflux disease (GERD).    · Certain radiation treatments to the head and neck.  SYMPTOMS  In the early stages of dental caries, symptoms are seldom present. Sometimes white, chalky areas may be seen on the enamel or other tooth layers. In later stages, symptoms may include:  · Pits and holes on the enamel.  · Toothache after sweet, hot, or cold foods or drinks are consumed.  · Pain around the tooth.  · Swelling around the tooth.  DIAGNOSIS   Most of the time, dental caries is detected during a regular dental checkup. A diagnosis is made after a thorough medical and dental history is taken and the surfaces of your teeth are checked for signs of dental caries. Sometimes special instruments, such as lasers, are used to check for dental caries. Dental X-ray exams may be taken so that areas not visible to the eye (such as between the contact areas of the teeth) can be checked for cavities.    TREATMENT   If dental caries is in its early stages, it may be reversed with a fluoride treatment or an application of a remineralizing agent at the dental office. Thorough brushing and flossing at home is needed to aid these treatments. If it is in its later stages, treatment depends on the location and extent of tooth destruction:   · If a small area of the tooth has been destroyed, the destroyed area will be removed and cavities will be filled with a material such as gold, silver amalgam, or composite resin.    · If a large area of the tooth has been destroyed, the destroyed area will be removed and a cap (crown) will be fitted over the remaining tooth structure.    · If the center part of the tooth (pulp) is affected, a procedure called a root canal will be needed before a filling or crown can be placed.    · If most of the tooth has been destroyed, the tooth may need to be pulled (extracted).  HOME CARE INSTRUCTIONS  You can prevent, stop, or reverse dental caries at home by practicing good oral hygiene. Good oral hygiene includes:  · Thoroughly cleaning your teeth at least twice a day with a toothbrush and dental floss.    · Using a fluoride toothpaste. A fluoride mouth rinse may also be used if recommended by your dentist or health care provider.    ·   Restricting the amount of sugary and starchy foods and sugary liquids you consume.    · Avoiding frequent snacking on these foods and sipping of these liquids.    · Keeping regular visits with a dentist for checkups and cleanings.  PREVENTION   · Practice good oral hygiene.  · Consider a dental sealant. A dental sealant is a coating material that is applied by your dentist to the pits and grooves of teeth. The sealant prevents food from being trapped in them. It may protect the teeth for several years.  · Ask about fluoride supplements if you live in a community without fluorinated water or with water that has a low fluoride content. Use fluoride supplements  as directed by your dentist or health care provider.  · Allow fluoride varnish applications to teeth if directed by your dentist or health care provider.  Document Released: 02/13/2002 Document Revised: 01/24/2013 Document Reviewed: 05/26/2012  [employer]® Patient Information ©2014 [employer].

## 2013-06-11 NOTE — ED Provider Notes (Signed)
CSN: [id number]     Arrival date & time 06/11/13  0903 History   First MD Initiated Contact with Patient 06/11/13 [phone]     Chief Complaint  Patient presents with  . Medication Refill  . Dental Pain   (Consider location/radiation/quality/duration/timing/severity/associated sxs/prior Treatment) HPI Comments: 48 year old male with history of hypertension and chronic hepatitis C presents complaining of left upper jaw dental pain, also needs a refill of his lisinopril. This dental pain has been ongoing since he was here back in October. He has worked with Archivist to get a dental appointment on January 15 in [place] to get to clear up her left teeth pulled. He came in earlier because the pain is severe and has been keeping him awake at night for the past 2 nights. The pain drops and radiates upward and causes a headache. He occasionally gets a bad taste in his mouth as well. Denies fever, chills, NVD, or any other symptoms  Patient is a 48 y.o. male presenting with tooth pain.  Dental Pain Associated symptoms: headaches   Associated symptoms: no fever and no neck pain     Past Medical History  Diagnosis Date  . Hypertension   . Hyperlipidemia   . Gastric ulcer   . Hepatitis C    Past Surgical History  Procedure Laterality Date  . Stomach surgery     Family History  Problem Relation Age of Onset  . Diabetes Father   . Hypertension Father   . Diabetes Sister   . Hypertension Sister   . Diabetes Brother   . Hypertension Brother    History  Substance Use Topics  . Smoking status: Former Games developer  . Smokeless tobacco: Not on file  . Alcohol Use: No    Review of Systems  Constitutional: Negative for fever, chills and fatigue.  HENT: Positive for dental problem. Negative for sore throat.   Eyes: Negative for visual disturbance.  Respiratory: Negative for cough and shortness of breath.   Cardiovascular: Negative for chest pain, palpitations and leg swelling.    Gastrointestinal: Negative for nausea, vomiting, abdominal pain, diarrhea and constipation.  Genitourinary: Negative for dysuria, urgency, frequency and hematuria.  Musculoskeletal: Negative for arthralgias, myalgias, neck pain and neck stiffness.  Skin: Negative for rash.  Neurological: Positive for headaches. Negative for dizziness, weakness and light-headedness.    Allergies  Ibuprofen  Home Medications   Current Outpatient Rx  Name  Route  Sig  Dispense  Refill  . amoxicillin (AMOXIL) 500 MG capsule   Oral   Take 1 capsule (500 mg total) by mouth 3 (three) times daily.   60 capsule   0   . amoxicillin (AMOXIL) 875 MG tablet   Oral   Take 1 tablet (875 mg total) by mouth 2 (two) times daily.   28 tablet   0   . aspirin-sod bicarb-citric acid (ALKA-SELTZER) 325 MG TBEF   Oral   Take 325 mg by mouth every 6 (six) hours as needed (stomache pain).         Marland Kitchen HYDROcodone-acetaminophen (NORCO/VICODIN) 5-325 MG per tablet   Oral   Take 1 tablet by mouth every 6 (six) hours as needed for pain.   20 tablet   0   . ibuprofen (ADVIL,MOTRIN) 200 MG tablet   Oral   Take 800 mg by mouth every 8 (eight) hours as needed for headache.          . lisinopril (PRINIVIL,ZESTRIL) 10 MG tablet   Oral  Take 1 tablet (10 mg total) by mouth daily.   30 tablet   1   . Multiple Vitamins-Minerals (MENS MULTI VITAMIN & MINERAL PO)   Oral   Take 1 tablet by mouth daily.         Marland Kitchen. oxyCODONE (ROXICODONE) 5 MG immediate release tablet   Oral   Take 1 tablet (5 mg total) by mouth every 6 (six) hours as needed for severe pain.   20 tablet   0    BP 125/84  Pulse 78  Temp(Src) 98 F (36.7 C) (Oral)  Resp 16  SpO2 97% Physical Exam  Nursing note and vitals reviewed. Constitutional: He is oriented to person, place, and time. He appears well-developed and well-nourished. No distress.  HENT:  Head: Normocephalic.  Mouth/Throat: Oropharynx is clear and moist and mucous membranes  are normal. Abnormal dentition. Dental caries present. No dental abscesses.    Left mandible tenderness due to remote jaw fracture  Pulmonary/Chest: Effort normal. No respiratory distress.  Neurological: He is alert and oriented to person, place, and time. Coordination normal.  Skin: Skin is warm and dry. No rash noted. He is not diaphoretic.  Psychiatric: He has a normal mood and affect. Judgment normal.    ED Course  Procedures (including critical care time) Labs Review Labs Reviewed  POCT I-STAT, CHEM 8 - Abnormal; Notable for the following:    Glucose, Bld 132 (*)    Calcium, Ion 1.27 (*)    Hemoglobin 19.0 (*)    HCT 56.0 (*)    All other components within normal limits   Imaging Review No results found.    MDM   1. Pain, dental   2. HTN (hypertension)   3. Dental caries    I-STAT is normal. Will use oxycodone without acetaminophen due to the history of chronic hepatitis C. He will followup with the dentist on January 15 as scheduled. He [facility] also [facility] with [facility]   Meds ordered this encounter  Medications  . amoxicillin (AMOXIL) 875 MG tablet    Sig: Take 1 tablet (875 mg total) by mouth 2 (two) times daily.    Dispense:  28 tablet    Refill:  0    Order Specific Question:  Supervising Provider    Answer:  Lorenz CoasterKELLER, DAVID C [id number]]  . oxyCODONE (ROXICODONE) 5 MG immediate release tablet    Sig: Take 1 tablet (5 mg total) by mouth every 6 (six) hours as needed for severe pain.    Dispense:  20 tablet    Refill:  0    Order Specific Question:  Supervising Provider    Answer:  Lorenz CoasterKELLER, DAVID C [id number]]  . lisinopril (PRINIVIL,ZESTRIL) 10 MG tablet    Sig: Take 1 tablet (10 mg total) by mouth daily.    Dispense:  30 tablet    Refill:  1    Order Specific Question:  Supervising Provider    Answer:  Lorenz CoasterKELLER, DAVID C [[id number]]      Graylon GoodZachary H Cherica Heiden, PA-C 06/11/13 1017

## 2013-06-11 NOTE — ED Provider Notes (Signed)
Medical screening examination/treatment/procedure(s) were performed by non-physician practitioner and as supervising physician I was immediately available for consultation/collaboration.  Leslee Homeavid Drue Camera, M.D.   Reuben Likesavid C [place] Depaz, MD 06/11/13 [phone]

## 2013-07-30 ENCOUNTER — Encounter (HOSPITAL_COMMUNITY): Payer: Self-pay | Admitting: Emergency Medicine

## 2013-07-30 ENCOUNTER — Emergency Department (HOSPITAL_COMMUNITY)
Admission: EM | Admit: 2013-07-30 | Discharge: 2013-07-30 | Disposition: A | Discharge status: Home / Self Care | Payer: PRIVATE HEALTH INSURANCE | Attending: Emergency Medicine | Admitting: Emergency Medicine

## 2013-07-30 DIAGNOSIS — F1110 Opioid abuse, uncomplicated: Secondary | ICD-10-CM | POA: Insufficient documentation

## 2013-07-30 DIAGNOSIS — Z8619 Personal history of other infectious and parasitic diseases: Secondary | ICD-10-CM | POA: Insufficient documentation

## 2013-07-30 DIAGNOSIS — Z79899 Other long term (current) drug therapy: Secondary | ICD-10-CM | POA: Insufficient documentation

## 2013-07-30 DIAGNOSIS — Z87891 Personal history of nicotine dependence: Secondary | ICD-10-CM | POA: Insufficient documentation

## 2013-07-30 DIAGNOSIS — Z8639 Personal history of other endocrine, nutritional and metabolic disease: Secondary | ICD-10-CM | POA: Insufficient documentation

## 2013-07-30 DIAGNOSIS — Z862 Personal history of diseases of the blood and blood-forming organs and certain disorders involving the immune mechanism: Secondary | ICD-10-CM | POA: Insufficient documentation

## 2013-07-30 DIAGNOSIS — IMO0001 Reserved for inherently not codable concepts without codable children: Secondary | ICD-10-CM | POA: Insufficient documentation

## 2013-07-30 DIAGNOSIS — Z8711 Personal history of peptic ulcer disease: Secondary | ICD-10-CM | POA: Insufficient documentation

## 2013-07-30 DIAGNOSIS — R197 Diarrhea, unspecified: Secondary | ICD-10-CM | POA: Insufficient documentation

## 2013-07-30 DIAGNOSIS — F1010 Alcohol abuse, uncomplicated: Secondary | ICD-10-CM | POA: Insufficient documentation

## 2013-07-30 DIAGNOSIS — I10 Essential (primary) hypertension: Secondary | ICD-10-CM | POA: Insufficient documentation

## 2013-07-30 DIAGNOSIS — Z792 Long term (current) use of antibiotics: Secondary | ICD-10-CM | POA: Insufficient documentation

## 2013-07-30 MED ORDER — CLONIDINE HCL 0.1 MG PO TABS
0.1000 mg | ORAL_TABLET | Freq: Once | ORAL | Status: AC
  Administered 2013-07-30: 0.1 mg via ORAL
  Filled 2013-07-30: qty 1

## 2013-07-30 MED ORDER — ONDANSETRON 4 MG PO TBDP: 4.0000 mg | ORAL_TABLET | Freq: Three times a day (TID) | ORAL | Status: DC | PRN

## 2013-07-30 MED ORDER — CLONIDINE HCL 0.1 MG PO TABS: 0.1000 mg | ORAL_TABLET | Freq: Three times a day (TID) | ORAL | Status: DC

## 2013-07-30 MED ORDER — BISMUTH SUBSALICYLATE 262 MG/15ML PO SUSP
30.0000 mL | Freq: Once | ORAL | Status: AC
  Administered 2013-07-30: 30 mL via ORAL
  Filled 2013-07-30: qty 236

## 2013-07-30 MED ORDER — ONDANSETRON 4 MG PO TBDP
4.0000 mg | ORAL_TABLET | Freq: Once | ORAL | Status: AC
  Administered 2013-07-30: 4 mg via ORAL
  Filled 2013-07-30: qty 1

## 2013-07-30 NOTE — Discharge Instructions (Signed)
Alcohol Withdrawal °Anytime drug use is interfering with normal living activities it has become abuse. This includes problems with family and friends. Psychological dependence has developed when your mind tells you that the drug is needed. This is usually followed by physical dependence when a continuing increase of drugs are required to get the same feeling or "high." This is known as addiction or chemical dependency. A person's risk is much higher if there is a history of chemical dependency in the family. °Mild Withdrawal Following Stopping Alcohol, When Addiction or Chemical Dependency Has Developed °When a person has developed tolerance to alcohol, any sudden stopping of alcohol can cause uncomfortable physical symptoms. Most of the time these are mild and consist of tremors in the hands and increases in heart rate, breathing, and temperature. Sometimes these symptoms are associated with anxiety, panic attacks, and bad dreams. There may also be stomach upset. Normal sleep patterns are often interrupted with periods of inability to sleep (insomnia). This may last for 6 months. Because of this discomfort, many people choose to continue drinking to get rid of this discomfort and to try to feel normal. °Severe Withdrawal with Decreased or No Alcohol Intake, When Addiction or Chemical Dependency Has Developed °About five percent of alcoholics will develop signs of severe withdrawal when they stop using alcohol. One sign of this is development of generalized seizures (convulsions). Other signs of this are severe agitation and confusion. This may be associated with believing in things which are not real or seeing things which are not really there (delusions and hallucinations). Vitamin deficiencies are usually present if alcohol intake has been long-term. Treatment for this most often requires hospitalization and close observation. °Addiction can only be helped by stopping use of all chemicals. This is hard but may  save your life. With continual alcohol use, possible outcomes are usually loss of self respect and esteem, violence, and death. °Addiction cannot be cured but it can be stopped. This often requires outside help and the care of professionals. Treatment centers are listed in the yellow pages under Cocaine, Narcotics, and Alcoholics Anonymous. Most hospitals and clinics can refer you to a specialized care center. °It is not necessary for you to go through the uncomfortable symptoms of withdrawal. Your caregiver can provide you with medicines that will help you through this difficult period. Try to avoid situations, friends, or drugs that made it possible for you to keep using alcohol in the past. Learn how to say no. °It takes a long period of time to overcome addictions to all drugs, including alcohol. There may be many times when you feel as though you want a drink. After getting rid of the physical addiction and withdrawal, you will have a lessening of the craving which tells you that you need alcohol to feel normal. Call your caregiver if more support is needed. Learn who to talk to in your family and among your friends so that during these periods you can receive outside help. Alcoholics Anonymous (AA) has helped many people over the years. To get further help, contact AA or call your caregiver, counselor, or clergyperson. Al-Anon and Alateen are support groups for friends and family members of an alcoholic. The people who love and care for an alcoholic often need help, too. For information about these organizations, check your phone directory or call a local alcoholism treatment center.  °SEEK IMMEDIATE MEDICAL CARE IF:  °· You have a seizure. °· You have a fever. °· You experience uncontrolled vomiting or you   vomit up blood. This may be bright red or look like black coffee grounds.  You have blood in the stool. This may be bright red or appear as a black, tarry, bad-smelling stool.  You become lightheaded or  faint. Do not drive if you feel this way. Have someone else drive you or call 161 for help.  You become more agitated or confused.  You develop uncontrolled anxiety.  You begin to see things that are not really there (hallucinate). Your caregiver has determined that you completely understand your medical condition, and that your mental state is back to normal. You understand that you have been treated for alcohol withdrawal, have agreed not to drink any alcohol for a minimum of 1 day, will not operate a car or other machinery for 24 hours, and have had an opportunity to ask any questions about your condition. Document Released: 03/03/2005 Document Revised: 08/16/2011 Document Reviewed: 01/10/2008 [facility] Patient Information 2014 [place], [place].  Emergency Department Resource Guide 1) Find a Doctor and Pay Out of Pocket Although you won't have to find out who is covered by your insurance plan, it is a good idea to ask around and get recommendations. You will then need to call the office and see if the doctor you have chosen will accept you as a new patient and what types of options they offer for patients who are self-pay. Some doctors offer discounts or will set up payment plans for their patients who do not have insurance, but you will need to ask so you aren't surprised when you get to your appointment.  2) Contact Your Local Health Department Not all health departments have doctors that can see patients for sick visits, but many do, so it is worth a call to see if yours does. If you don't know where your local health department is, you can check in your phone book. The CDC also has a tool to help you locate your state's health department, and many state websites also have listings of all of their local health departments.  3) Find a Walk-in Clinic If your illness is not likely to be very severe or complicated, you may want to try a walk in clinic. These are popping up all over the country in  pharmacies, drugstores, and shopping centers. They're usually staffed by nurse practitioners or physician assistants that have been trained to treat common illnesses and complaints. They're usually fairly quick and inexpensive. However, if you have serious medical issues or chronic medical problems, these are probably not your best option.  No Primary Care Doctor: - Call Health Connect at  [phone] - they can help you locate a primary care doctor that  accepts your insurance, provides certain services, etc. - Physician Referral Service- [phone]  Chronic Pain Problems: Organization         Address  Phone   Notes  [facility]  [phone] Patients need to be referred by their primary care doctor.   Medication Assistance: Organization         Address  Phone   Notes  [facility] Medication [facility] [address] [place], [place] [address] [phone] --Must be a resident of [facility] -- Must have NO insurance coverage whatsoever (no Medicaid/ Medicare, etc.) -- The pt. MUST have a primary care doctor that directs their care regularly and follows them in the community   [facility]  [phone]   [address]  [phone]  Agencies that provide inexpensive medical care: Organization         Address  Phone   Notes  [place]  [phone]   [facility]    [phone]   [facility] [place], [place] [address] [phone]   [facility] [address], [place] [phone]   Planned Parenthood    [phone]   [facility]    [phone]   [place] [facility] [place] [address], [place] Phone:  [phone], Fax:  [phone] Hours of Operation:  9 am - 6 pm, M-F.  Also accepts Medicaid/Medicare and self-pay.  [facility] for [place], [address],  [place] Phone: [phone], Fax: [phone]. Hours of Operation:  8:30 am - 5:30 pm, M-F.  Also accepts Medicaid and self-pay.  [facility] [place] [address], [place] Phone: [phone]   [place], [place], [place] [phone], Ext. [phone] Mondays & Thursdays: 7-9 AM.  First 15 patients are seen on a first come, first serve basis.    [place] Providers:  Organization         Address  Phone   Notes  [facility] [address], [place] [phone] Also accepts self-pay patients.  [facility] [address], [place]  [phone]   [place], [address], [place] [phone]   [facility] [address], [place] [phone]   Lucianne Lei [address], [place]   [phone] Only accepts [place] Access [place] patients after they have their name applied to their card.   Self-Pay (no insurance) in [facility]:  Organization         Address  Phone   Notes  Sickle Cell Patients, [facility] [place] [phone]   [facility] [place] [phone]   [facility] [place]  [place], [place], [place] [phone]   [facility]/Dr. Osei-Bonsu  [address] or [address], [place] [phone] Phone number for both [place] and [place] locations is the same.  [facility] [address] [place] [phone]   [facility] [address], [place] or [address] [phone]   [facility] [address], [place] [phone], phone; [phone], fax Sees patients 1st and 3rd Saturday of every month.  Must not  qualify for public or private insurance (i.e. Medicaid, Medicare, [place] Health Choice, Veterans' Benefits)  Household income should be no more than 200% of the poverty level The clinic cannot treat you if you are pregnant or think you are pregnant  Sexually transmitted diseases are not treated at the clinic.    Dental Care: Organization         Address  Phone  Notes  [facility] of [facility] [place] [phone] Accepts children up to age 15 who are enrolled in [place] or [place]; pregnant women with a Medicaid card; and children who have applied for Medicaid or  [place] Health Choice, but were declined, whose parents can pay a reduced fee at time of service.  [facility] of [facility]  [address], [place] [phone] Accepts children up to age 14 who are enrolled in [place] or [place]; pregnant women with a Medicaid card; and children who have applied for Medicaid or [place] Health Choice, but were declined, whose parents can pay a reduced fee at time of service.  [place] Adult Dental Access PROGRAM  [place] [phone] Patients are seen by appointment only. Walk-ins are not accepted. [place] will see patients 9 years of age and older. Monday - Tuesday (8am-5pm) Most Wednesdays (8:30-5pm) $30 per visit, cash only  [facility] Adult Dental Access PROGRAM  [address], [facility] [phone] Patients are seen by appointment only. Walk-ins are not accepted. [place] will see patients 76 years of age and older. One Wednesday Evening (Monthly: Volunteer Based).  $30 per visit, cash only  [place]  [phone] for adults; Children under age 19, call Graduate Pediatric Dentistry at [phone]. Children aged 64-14, please call [phone] to request a pediatric application.  Dental services are provided  in all areas of dental care including fillings, crowns and bridges, complete and partial dentures, implants, gum treatment, root canals, and extractions. Preventive care is also provided. Treatment is provided to both adults and children. Patients are selected via a lottery and there is often a waiting list.   [facility] [address] [place]  [phone] [email or web address]   [facility] [address] [place], [place] [phone], Ext. [phone] Second and Fourth Thursday of each month, opens at 6:30 AM; Clinic ends at 9 AM.  Patients are seen on a first-come first-served basis, and a limited number are seen during each clinic.   [facility]  [address] Hillard Danker [place], [place] [phone]   Eligibility Requirements You must have lived in [place], [place], or [place] counties for at least the last three months.   You cannot be eligible for state or federal sponsored [employer], including [employer], [place], or [employer].   You generally cannot be eligible for healthcare insurance through your employer.    How to apply: Eligibility screenings are held every Tuesday and Wednesday afternoon from 1:00 pm until 4:00 pm. You do not need an appointment for the interview!  [facility] [address], [place], [place]   [place]  Walker Department  [place]  [phone]    Behavioral Health Resources in the Community: Intensive Outpatient Programs Organization         Address  Phone  Notes  [place]. [address] [place], [place] [phone]   [facility] Outpatient [address] [place], [place]   ADS: Alcohol & Drug Svcs [address], [place], [place]   [place] [address],  [place], [place] or [phone]   Substance Abuse Resources Organization         Address  Phone  Notes  Alcohol and Drug Services  [phone]   [facility]  [phone]   [facility] [place]  [phone]   Chinita Pester  [phone]   Residential & Outpatient Substance Abuse Program  [phone]  Psychological Services Organization         Address  Phone  Notes  [facility]  [phone]- [phone]   [facility]  [phone]- [phone]   [facility] [phone], [place] [phone] or [phone]    Mobile Crisis Teams Organization         Address  Phone  Notes  [facility], Mobile Crisis Care Unit  [phone]   [facility]  [address] [place], [place] [phone]   Doristine LocksSharon DeEsch [address] [place] [phone]    Self-Help/Support Groups Organization         Address  Phone             Notes  [facility] of [place] - variety of support groups  336- [phone] Call for more information  Narcotics Anonymous (NA), [facility] [address], [place]  2 meetings at this location   Statisticianesidential Treatment Programs Organization         Address  Phone  Notes  [facility] [address],    [place]  [phone]   [facility]  [address], [place] [phone], [place], [place] [phone]   [facility] [address], [place] [phone] Admissions: 8am-3pm M-F  [facility] [address]    [place], [place] [phone]   [facility] [address], [place], [place] [phone]   [facility] [address]  [place], [place] [phone]   [facility] - Intensive Outpatient [address], [place], [place] [phone]   [facility] [address]  [place], [place] [phone] or  [phone]   [facility] (RT[facility]) [address] [place], [place] [phone] Accepts Medicaid  [facility] [address]  [place] [phone] Substance Abuse/Addiction Treatment   [facility] Behavioral Health Resources Organization         Address  Phone  Notes  [facility]  [phone]   Angie FavaJulie Brannon, PhD [address], [address] [place], [place]   [phone] or [phone]   [facility]   [address] [place], [place] [phone]   [facility] [address], [place], [place] [phone] Insurance/Medicaid/sponsorship through [facility]  [facility] [address]                                    [place], [place] [phone] Therapy/tele-psych/case  [facility] [address].   [place], [place] [phone]    Dr. Lolly MustacheArfeen  [phone]) [phone]   [facility]  [employer] [facility] 1) [address], [place] 2) [address], [place] 3)  [address], [place] [phone]) [phone]  [phone]   [facility] Child Abuse Hotline [phone]) 2064196641 or [phone]) [phone] (After Hours)     You have been given a prescription for clonidine which you are to use as follows.  0.1-0.3 mg for 1-3 tablets every 2-4 hours.  For the first 24 hours.  On day 2, you can take 1-2 tablets 3 times a day.  Continue this for the next 10-14 days.  You've also been given a prescription for Zofran that, you can use for any nausea.  Please  attend AA and NA meetings daily

## 2013-07-30 NOTE — ED Provider Notes (Signed)
Medical screening examination/treatment/procedure(s) were performed by non-physician practitioner and as supervising physician I was immediately available for consultation/collaboration.    Chad RollerBrian D Alleah Dearman, MD 07/30/13 [phone]

## 2013-07-30 NOTE — ED Provider Notes (Signed)
CSN: [id number]     Arrival date & time 07/30/13  0315 History   First MD Initiated Contact with Patient 07/30/13 0335     Chief Complaint  Patient presents with  . Abdominal Pain     (Consider location/radiation/quality/duration/timing/severity/associated sxs/prior Treatment) HPI Comments: Patient presents tonight requesting detox from opiates and alcohol.  He, states that he was clean and sober for 3 years, but in November.  Her nose, and again, became addicted to oxycodone, when he found that he was using too much oxycodone he started drinking alcohol to modulate.  The fracture now.  He states he wants off of all of his opiate and alcohol.  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  He is willing to go to in a if we can help him with his symptoms of abdominal cramping, and diarrhea. She denies suicidality or homicidality, previous psychiatric history  Patient is a 48 y.o. male presenting with abdominal pain. The history is provided by the patient.  Abdominal Pain Pain location:  Generalized Pain quality: cramping   Pain severity:  Moderate Onset quality:  Gradual Duration:  1 day Timing:  Intermittent Progression:  Worsening Chronicity:  New Context: alcohol use   Context comment:  Opiate abuse Relieved by:  None tried Worsened by:  Nothing tried Ineffective treatments:  None tried Associated symptoms: diarrhea   Associated symptoms: no chest pain, no fever and no shortness of breath   Associated symptoms comment:  Opiate and alcohol abuse Risk factors: alcohol abuse     Past Medical History  Diagnosis Date  . Hypertension   . Hyperlipidemia   . Gastric ulcer   . Hepatitis C    Past Surgical History  Procedure Laterality Date  . Stomach surgery     Family History  Problem Relation Age of Onset  . Diabetes Father   . Hypertension Father   . Diabetes Sister   . Hypertension Sister   . Diabetes Brother   . Hypertension Brother     History  Substance Use Topics  . Smoking status: Former Games developermoker  . Smokeless tobacco: Not on file  . Alcohol Use: No    Review of Systems  Constitutional: Negative for fever.  Respiratory: Negative for shortness of breath.   Cardiovascular: Negative for chest pain.  Gastrointestinal: Positive for abdominal pain and diarrhea.  Musculoskeletal: Positive for myalgias.  Psychiatric/Behavioral: Negative for suicidal ideas, hallucinations, behavioral problems, confusion, sleep disturbance and agitation. The patient is not nervous/anxious.   All other systems reviewed and are negative.      Allergies  Ibuprofen  Home Medications   Current Outpatient Rx  Name  Route  Sig  Dispense  Refill  . amoxicillin (AMOXIL) 500 MG capsule   Oral   Take 1 capsule (500 mg total) by mouth 3 (three) times daily.   60 capsule   0   . amoxicillin (AMOXIL) 875 MG tablet   Oral   Take 1 tablet (875 mg total) by mouth 2 (two) times daily.   28 tablet   0   . aspirin-sod bicarb-citric acid (ALKA-SELTZER) 325 MG TBEF   Oral   Take 325 mg by mouth every 6 (six) hours as needed (stomache pain).         . cloNIDine (CATAPRES) 0.1 MG tablet   Oral   Take 1 tablet (0.1 mg total) by mouth 3 (three) times daily.   60 tablet   11     The  first 24 hours.  Please take 0.1-0.3 mg of clo ...   . HYDROcodone-acetaminophen (NORCO/VICODIN) 5-325 MG per tablet   Oral   Take 1 tablet by mouth every 6 (six) hours as needed for pain.   20 tablet   0   . ibuprofen (ADVIL,MOTRIN) 200 MG tablet   Oral   Take 800 mg by mouth every 8 (eight) hours as needed for headache.          . lisinopril (PRINIVIL,ZESTRIL) 10 MG tablet   Oral   Take 1 tablet (10 mg total) by mouth daily.   30 tablet   1   . Multiple Vitamins-Minerals (MENS MULTI VITAMIN & MINERAL PO)   Oral   Take 1 tablet by mouth daily.         . ondansetron (ZOFRAN-ODT) 4 MG disintegrating tablet   Oral   Take 1 tablet (4 mg total)  by mouth every 8 (eight) hours as needed for nausea or vomiting.   20 tablet   0   . oxyCODONE (ROXICODONE) 5 MG immediate release tablet   Oral   Take 1 tablet (5 mg total) by mouth every 6 (six) hours as needed for severe pain.   20 tablet   0    BP 145/86  Pulse 72  Temp(Src) 98 F (36.7 C) (Oral)  Resp 18  Ht 5\' 10"  (1.778 m)  Wt 170 lb (77.111 kg)  BMI 24.39 kg/m2  SpO2 99% Physical Exam  Nursing note and vitals reviewed. Constitutional: He appears well-developed and well-nourished. He appears distressed.  Eyes: Pupils are equal, round, and reactive to light.  Neck: Normal range of motion.  Cardiovascular: Normal rate.   Pulmonary/Chest: Effort normal.  Abdominal: Soft. He exhibits no distension. There is no tenderness.  Musculoskeletal: Normal range of motion.  Neurological: He is alert.  Skin: Skin is warm and dry.    ED Course  Procedures (including critical care time) Labs Review Labs Reviewed - No data to display Imaging Review No results found.  EKG Interpretation   None       MDM  Patient is willing to try detoxing at home.  States he will attend a meeting, if we can help control his abdominal cramping. I will start clonidine 0.1 in the emergency department.  I will give him instructions to use between 0.1 and 0.3 every 2-4 hours.  For the first 24 hours and then 3 times a day Final diagnoses:  Alcohol abuse  Opioid abuse        Arman Filter, NP 07/30/13 [phone]

## 2013-07-30 NOTE — ED Notes (Signed)
Pt reports he has been taking oxy from the street for approx 6 months, also drinking to help curb the desire for them.  Is here tonight c/o abdominal pain and detox.  He wants to get off both of these.  Last oxy was 330pm yesterday.

## 2013-07-30 NOTE — ED Notes (Signed)
Pt complains of abd pain for three days, sts has attempted otc meds but cant keep it down.

## 2013-08-03 ENCOUNTER — Emergency Department (HOSPITAL_COMMUNITY)
Admission: EM | Admit: 2013-08-03 | Discharge: 2013-08-03 | Disposition: A | Discharge status: Home / Self Care | Payer: PRIVATE HEALTH INSURANCE | Attending: Emergency Medicine | Admitting: Emergency Medicine

## 2013-08-03 ENCOUNTER — Encounter (HOSPITAL_COMMUNITY): Payer: Self-pay | Admitting: Emergency Medicine

## 2013-08-03 DIAGNOSIS — Z8719 Personal history of other diseases of the digestive system: Secondary | ICD-10-CM | POA: Insufficient documentation

## 2013-08-03 DIAGNOSIS — Z8619 Personal history of other infectious and parasitic diseases: Secondary | ICD-10-CM | POA: Insufficient documentation

## 2013-08-03 DIAGNOSIS — R5381 Other malaise: Secondary | ICD-10-CM | POA: Insufficient documentation

## 2013-08-03 DIAGNOSIS — Z862 Personal history of diseases of the blood and blood-forming organs and certain disorders involving the immune mechanism: Secondary | ICD-10-CM | POA: Insufficient documentation

## 2013-08-03 DIAGNOSIS — Z87891 Personal history of nicotine dependence: Secondary | ICD-10-CM | POA: Insufficient documentation

## 2013-08-03 DIAGNOSIS — K2970 Gastritis, unspecified, without bleeding: Secondary | ICD-10-CM | POA: Insufficient documentation

## 2013-08-03 DIAGNOSIS — R5383 Other fatigue: Secondary | ICD-10-CM

## 2013-08-03 DIAGNOSIS — K2990 Gastroduodenitis, unspecified, without bleeding: Principal | ICD-10-CM

## 2013-08-03 DIAGNOSIS — Z8639 Personal history of other endocrine, nutritional and metabolic disease: Secondary | ICD-10-CM | POA: Insufficient documentation

## 2013-08-03 DIAGNOSIS — Z79899 Other long term (current) drug therapy: Secondary | ICD-10-CM | POA: Insufficient documentation

## 2013-08-03 DIAGNOSIS — I10 Essential (primary) hypertension: Secondary | ICD-10-CM | POA: Insufficient documentation

## 2013-08-03 DIAGNOSIS — Z9889 Other specified postprocedural states: Secondary | ICD-10-CM | POA: Insufficient documentation

## 2013-08-03 LAB — CBC WITH DIFFERENTIAL/PLATELET
Basophils Absolute: 0 K/uL (ref 0.0–0.1)
Basophils Relative: 0 % (ref 0–1)
Eosinophils Absolute: 0 10*3/uL (ref 0.0–0.7)
Eosinophils Relative: 0 % (ref 0–5)
HCT: 44.4 % (ref 39.0–52.0)
Hemoglobin: 15.8 g/dL (ref 13.0–17.0)
Lymphocytes Relative: 28 % (ref 12–46)
Lymphs Abs: 2.8 10*3/uL (ref 0.7–4.0)
MCH: 32.9 pg (ref 26.0–34.0)
MCHC: 35.6 g/dL (ref 30.0–36.0)
MCV: 92.5 fL (ref 78.0–100.0)
Monocytes Absolute: 0.9 10*3/uL (ref 0.1–1.0)
Monocytes Relative: 9 % (ref 3–12)
Neutro Abs: 6.4 K/uL (ref 1.7–7.7)
Neutrophils Relative %: 62 % (ref 43–77)
Platelets: 232 K/uL (ref 150–400)
RBC: 4.8 MIL/uL (ref 4.22–5.81)
RDW: 11.9 % (ref 11.5–15.5)
WBC: 10.2 K/uL (ref 4.0–10.5)

## 2013-08-03 LAB — COMPREHENSIVE METABOLIC PANEL WITH GFR
ALT: 28 U/L (ref 0–53)
AST: 24 U/L (ref 0–37)
Alkaline Phosphatase: 57 U/L (ref 39–117)
CO2: 22 meq/L (ref 19–32)
Chloride: 100 meq/L (ref 96–112)
GFR calc non Af Amer: 79 mL/min — ABNORMAL LOW (ref >90)
Potassium: 3.9 meq/L (ref 3.7–5.3)
Sodium: 138 meq/L (ref 137–147)
Total Bilirubin: 0.5 mg/dL (ref 0.3–1.2)

## 2013-08-03 LAB — COMPREHENSIVE METABOLIC PANEL
Albumin: 4 g/dL (ref 3.5–5.2)
BUN: 5 mg/dL — ABNORMAL LOW (ref 6–23)
Calcium: 9.3 mg/dL (ref 8.4–10.5)
Creatinine, Ser: 1.08 mg/dL (ref 0.50–1.35)
GFR calc Af Amer: >90 mL/min (ref >90)
Glucose, Bld: 100 mg/dL — ABNORMAL HIGH (ref 70–99)
Total Protein: 7.8 g/dL (ref 6.0–8.3)

## 2013-08-03 MED ORDER — GI COCKTAIL ~~LOC~~
30.0000 mL | Freq: Once | ORAL | Status: AC
  Administered 2013-08-03: 30 mL via ORAL
  Filled 2013-08-03: qty 30

## 2013-08-03 MED ORDER — DICYCLOMINE HCL 10 MG PO CAPS
10.0000 mg | ORAL_CAPSULE | Freq: Once | ORAL | Status: DC
  Filled 2013-08-03: qty 1

## 2013-08-03 MED ORDER — LISINOPRIL 10 MG PO TABS
10.0000 mg | ORAL_TABLET | Freq: Every day | ORAL | Status: DC
Start: 2013-08-03 — End: 2013-09-20

## 2013-08-03 NOTE — ED Provider Notes (Signed)
Medical screening examination/treatment/procedure(s) were performed by non-physician practitioner and as supervising physician I was immediately available for consultation/collaboration.   EKG Interpretation None        Chad PoundMichael Y. Oletta LamasGhim, MD 08/03/13 2131

## 2013-08-03 NOTE — ED Notes (Addendum)
Presents with generalized weakness that began 3 days ago, associated with generalized abdominal pain that has stopped. Denies SOB, denies pain chest pain. Reports black stool that is loose that began today. HX of alcoholism and drug abuse-just quit 3-4 days ago. Pt is anxious. Denies pain at this time  Pt reports heavy alcohol use and abuse of opiates for one year. PLaced on "detox medicine, I forgot the name. I am scared because my stool is black. Ever since I quit taking those oxys I ain't got no energy. I don't have no appetite. I tried drinking a red bull and that did no good. I just have no energy"

## 2013-08-03 NOTE — ED Notes (Signed)
EDP in room  

## 2013-08-03 NOTE — Discharge Instructions (Signed)
Gastritis, Adult Gastritis is soreness and swelling (inflammation) of the lining of the stomach. Gastritis can develop as a sudden onset (acute) or long-term (chronic) condition. If gastritis is not treated, it can lead to stomach bleeding and ulcers. CAUSES  Gastritis occurs when the stomach lining is weak or damaged. Digestive juices from the stomach then inflame the weakened stomach lining. The stomach lining may be weak or damaged due to viral or bacterial infections. One common bacterial infection is the Helicobacter pylori infection. Gastritis can also result from excessive alcohol consumption, taking certain medicines, or having too much acid in the stomach.  SYMPTOMS  In some cases, there are no symptoms. When symptoms are present, they may include:  Pain or a burning sensation in the upper abdomen.  Nausea.  Vomiting.  An uncomfortable feeling of fullness after eating. DIAGNOSIS  Your caregiver may suspect you have gastritis based on your symptoms and a physical exam. To determine the cause of your gastritis, your caregiver may perform the following:  Blood or stool tests to check for the H pylori bacterium.  Gastroscopy. A thin, flexible tube (endoscope) is passed down the esophagus and into the stomach. The endoscope has a light and camera on the end. Your caregiver uses the endoscope to view the inside of the stomach.  Taking a tissue sample (biopsy) from the stomach to examine under a microscope. TREATMENT  Depending on the cause of your gastritis, medicines may be prescribed. If you have a bacterial infection, such as an H pylori infection, antibiotics may be given. If your gastritis is caused by too much acid in the stomach, H2 blockers or antacids may be given. Your caregiver may recommend that you stop taking aspirin, ibuprofen, or other nonsteroidal anti-inflammatory drugs (NSAIDs). HOME CARE INSTRUCTIONS  Only take over-the-counter or prescription medicines as directed by  your caregiver.  If you were given antibiotic medicines, take them as directed. Finish them even if you start to feel better.  Drink enough fluids to keep your urine clear or pale yellow.  Avoid foods and drinks that make your symptoms worse, such as:  Caffeine or alcoholic drinks.  Chocolate.  Peppermint or mint flavorings.  Garlic and onions.  Spicy foods.  Citrus fruits, such as oranges, lemons, or limes.  Tomato-based foods such as sauce, chili, salsa, and pizza.  Fried and fatty foods.  Eat small, frequent meals instead of large meals. SEEK IMMEDIATE MEDICAL CARE IF:   You have black or dark red stools.  You vomit blood or material that looks like coffee grounds.  You are unable to keep fluids down.  Your abdominal pain gets worse.  You have a fever.  You do not feel better after 1 week.  You have any other questions or concerns. MAKE SURE YOU:  Understand these instructions.  Will watch your condition.  Will get help right away if you are not doing well or get worse. Document Released: 05/18/2001 Document Revised: 11/23/2011 Document Reviewed: 07/07/2011 [facility] Patient Information 2014 [employer], [place].   Bland foods, advance diet as tolerated Zofran if needed for nausea Maalox as needed OTC

## 2013-08-03 NOTE — ED Provider Notes (Signed)
CSN: [id number]     Arrival date & time 08/03/13  1524 History   First MD Initiated Contact with Patient 08/03/13 1902     Chief Complaint  Patient presents with  . Fatigue      (Consider location/radiation/quality/duration/timing/severity/associated sxs/prior Treatment) The history is provided by the patient. No language interpreter was used.   Patient is a 48 year old male who presents with abdominal pain. He describes this pain as tenderness over his stomach area. He reports that he has not been eating much the last couple days. He has tried chicken noodle soup and crackers. He reports that he was feeling a little fatigued like he had no energy and he drank a red bull today. He reports that his stomach started to hurt after drinking the red bull. He denies pain at this time and feels like his stomach is a little upset. He also reports that he has had a darker stool than normal today. He reports that it is soft, formed like his usual stool it is just dark in color. He reports that he has been de-toxing himself after taking oxycodone and drinking alcohol. He reports that he hasn't had any oxys or alcohol in 5 days.   Past Medical History  Diagnosis Date  . Hypertension   . Hyperlipidemia   . Gastric ulcer   . Hepatitis C    Past Surgical History  Procedure Laterality Date  . Stomach surgery     Family History  Problem Relation Age of Onset  . Diabetes Father   . Hypertension Father   . Diabetes Sister   . Hypertension Sister   . Diabetes Brother   . Hypertension Brother    History  Substance Use Topics  . Smoking status: Former Games developer  . Smokeless tobacco: Not on file  . Alcohol Use: Yes    Review of Systems  Constitutional: Negative for fever and chills.  Gastrointestinal: Positive for abdominal pain. Negative for nausea, vomiting, diarrhea and abdominal distention.       Epigastric tenderness  Neurological: Negative for weakness.      Allergies   Ibuprofen  Home Medications   Current Outpatient Rx  Name  Route  Sig  Dispense  Refill  . bismuth subsalicylate (PEPTO BISMOL) 262 MG/15ML suspension   Oral   Take 30 mLs by mouth every 6 (six) hours as needed for indigestion.         . cloNIDine (CATAPRES) 0.1 MG tablet   Oral   Take 0.1 mg by mouth 3 (three) times daily.         . diphenhydrAMINE (BENADRYL) 25 mg capsule   Oral   Take 50 mg by mouth daily as needed for allergies or sleep.         Marland Kitchen ibuprofen (ADVIL,MOTRIN) 200 MG tablet   Oral   Take 800 mg by mouth every 8 (eight) hours as needed for headache.          . lisinopril (PRINIVIL,ZESTRIL) 10 MG tablet   Oral   Take 10 mg by mouth daily.         . Multiple Vitamins-Minerals (MENS MULTI VITAMIN & MINERAL PO)   Oral   Take 1 tablet by mouth daily.         . ondansetron (ZOFRAN-ODT) 4 MG disintegrating tablet   Oral   Take 4 mg by mouth every 8 (eight) hours as needed for nausea or vomiting.         Marland Kitchen oxyCODONE (OXY IR/ROXICODONE) 5  MG immediate release tablet   Oral   Take 5 mg by mouth every 6 (six) hours as needed for severe pain.           BP 125/81  Pulse 91  Temp(Src) 98 F (36.7 C) (Oral)  Resp 16  Wt 178 lb 9 oz (80.995 kg)  SpO2 99% Physical Exam  Nursing note and vitals reviewed. Constitutional: He is oriented to person, place, and time. He appears well-developed and well-nourished.  HENT:  Head: Normocephalic and atraumatic.  Eyes: Conjunctivae and EOM are normal.  Neck: Normal range of motion. Neck supple. No JVD present. No tracheal deviation present. No thyromegaly present.  Cardiovascular: Normal rate, regular rhythm and normal heart sounds.   Pulmonary/Chest: Effort normal and breath sounds normal. No respiratory distress. He has no wheezes.  Abdominal: Soft. Bowel sounds are normal. He exhibits no distension. There is no tenderness. There is no rebound and no guarding.  Musculoskeletal: Normal range of motion.   Lymphadenopathy:    He has no cervical adenopathy.  Neurological: He is alert and oriented to person, place, and time.  Skin: Skin is warm and dry.  Psychiatric: He has a normal mood and affect. His behavior is normal. Judgment and thought content normal.    ED Course  Procedures (including critical care time) Labs Review Labs Reviewed  COMPREHENSIVE METABOLIC PANEL - Abnormal; Notable for the following:    Glucose, Bld 100 (*)    BUN 5 (*)    GFR calc non Af Amer 79 (*)    All other components within normal limits  CBC WITH DIFFERENTIAL  POC OCCULT BLOOD, ED   Imaging Review No results found.  EKG Interpretation  None  MDM   Final diagnoses:  Gastritis     Pt reports feeling much better after GI Cocktail. Eating and drinking here in ER and tolerating well. Abdominal discomfort completely resolved. Lab work unremarkable. Advised to stop drinking beverages that are high in caffeine. Discussed plan with pt and he agrees. Return precautions given.      Irish EldersKelly Edwardo Wojnarowski, NP 08/03/13 2128

## 2013-09-20 ENCOUNTER — Emergency Department (INDEPENDENT_AMBULATORY_CARE_PROVIDER_SITE_OTHER)
Admission: EM | Admit: 2013-09-20 | Discharge: 2013-09-20 | Disposition: A | Discharge status: Home / Self Care | Payer: PRIVATE HEALTH INSURANCE | Source: Home / Self Care | Attending: Family Medicine | Admitting: Family Medicine

## 2013-09-20 ENCOUNTER — Encounter (HOSPITAL_COMMUNITY): Payer: Self-pay | Admitting: Emergency Medicine

## 2013-09-20 DIAGNOSIS — R7309 Other abnormal glucose: Secondary | ICD-10-CM

## 2013-09-20 DIAGNOSIS — I10 Essential (primary) hypertension: Secondary | ICD-10-CM

## 2013-09-20 LAB — POCT I-STAT, CHEM 8
BUN: 12 mg/dL (ref 6–23)
CHLORIDE: 103 meq/L (ref 96–112)
Calcium, Ion: 1.18 mmol/L (ref 1.12–1.23)
Creatinine, Ser: 1.1 mg/dL (ref 0.50–1.35)
Glucose, Bld: 176 mg/dL — ABNORMAL HIGH (ref 70–99)
HCT: 52 % (ref 39.0–52.0)
Hemoglobin: 17.7 g/dL — ABNORMAL HIGH (ref 13.0–17.0)
POTASSIUM: 4.5 meq/L (ref 3.7–5.3)
Sodium: 141 mEq/L (ref 137–147)
TCO2: 29 mmol/L (ref 0–100)

## 2013-09-20 MED ORDER — LISINOPRIL 5 MG PO TABS: 5.0000 mg | ORAL_TABLET | Freq: Every day | ORAL | Status: DC

## 2013-09-20 NOTE — ED Provider Notes (Signed)
Chad FerrierWilliam C Wolfe is a 48 y.o. male who presents to Urgent Care today for blood pressure medication refill. Patient ran out of lisinopril approximately 3 days ago. He feels well he denies any fevers chills nausea vomiting or diarrhea. He notes a mild headache intermittently for the past several days. He says this is typical for him and intrusive to caffeine. He denies any weakness or numbness or loss of function. He feels well otherwise.   Past Medical History  Diagnosis Date  . Hypertension   . Hyperlipidemia   . Gastric ulcer   . Hepatitis C    History  Substance Use Topics  . Smoking status: Former Games developermoker  . Smokeless tobacco: Not on file  . Alcohol Use: Yes   Family history positive for diabetes in multiple close family members. ROS as above Medications: No current facility-administered medications for this encounter.   Current Outpatient Prescriptions  Medication Sig Dispense Refill  . lisinopril (PRINIVIL,ZESTRIL) 5 MG tablet Take 1 tablet (5 mg total) by mouth daily.  30 tablet  1  . Multiple Vitamins-Minerals (MENS MULTI VITAMIN & MINERAL PO) Take 1 tablet by mouth daily.        Exam:  BP 128/85  Pulse 85  Temp(Src) 98.2 F (36.8 C) (Oral)  Resp 18  SpO2 96% Gen: Well NAD HEENT: EOMI,  MMM PERRLA Lungs: Normal work of breathing. CTABL Heart: RRR no MRG Abd: NABS, Soft. NT, ND Exts: Brisk capillary refill, warm and well perfused.  Neuro: Alert and oriented normal coordination gait balance and strength  Results for orders placed during the hospital encounter of 09/20/13 (from the past 24 hour(s))  POCT I-STAT, CHEM 8     Status: Abnormal   Collection Time    09/20/13  9:19 AM      Result Value Ref Range   Sodium 141  137 - 147 mEq/L   Potassium 4.5  3.7 - 5.3 mEq/L   Chloride 103  96 - 112 mEq/L   BUN 12  6 - 23 mg/dL   Creatinine, Ser 5.781.10  0.50 - 1.35 mg/dL   Glucose, Bld 469176 (*) 70 - 99 mg/dL   Calcium, Ion 6.291.18  5.281.12 - 1.23 mmol/L   TCO2 29  0 - 100  mmol/L   Hemoglobin 17.7 (*) 13.0 - 17.0 g/dL   HCT 41.352.0  24.439.0 - 01.052.0 %   No results found.  Assessment and Plan: 48 y.o. male with hypertension and elevated blood sugar. 1) hypertension: Reduced lisinopril 5 mg daily given patient's creatinine is now 1.1 and his blood pressure is well controlled off of medication.  Followup with primary care provider. 2) elevated blood sugar: Not diagnostic of diabetes as this is not a fasting sugar. Patient has a strong family history for diabetes. He would benefit from A1c evaluation her primary care provider.  Discussed warning signs or symptoms. Please see discharge instructions. Patient expresses understanding.    Rodolph BongEvan S Joeseph Verville, MD 09/20/13 [phone]

## 2013-09-20 NOTE — ED Notes (Signed)
C/o medication refill States he has been out of meds for three days States he does have headaches. States without meds his blood pressure has been great

## 2013-09-20 NOTE — Discharge Instructions (Signed)
Thank you for coming in today. Take lisinopril daily. [facility] as possible. Your blood sugar is elevated. You may have diabetes. You need further testing. [facility] [address] [place], [place] [address] [phone]   Arterial Hypertension Arterial hypertension (high blood pressure) is a condition of elevated pressure in your blood vessels. Hypertension over a long period of time is a risk factor for strokes, heart attacks, and heart failure. It is also the leading cause of kidney (renal) failure.  CAUSES   In Adults -- Over 90% of all hypertension has no known cause. This is called essential or primary hypertension. In the other 10% of people with hypertension, the increase in blood pressure is caused by another disorder. This is called secondary hypertension. Important causes of secondary hypertension are:  Heavy alcohol use.  Obstructive sleep apnea.  Hyperaldosterosim (Conn's syndrome).  Steroid use.  Chronic kidney failure.  Hyperparathyroidism.  Medications.  Renal artery stenosis.  Pheochromocytoma.  Cushing's disease.  Coarctation of the aorta.  Scleroderma renal crisis.  Licorice (in excessive amounts).  Drugs (cocaine, methamphetamine). Your caregiver can explain any items above that apply to you.  In Children -- Secondary hypertension is more common and should always be considered.  Pregnancy -- Few women of childbearing age have high blood pressure. However, up to 10% of them develop hypertension of pregnancy. Generally, this will not harm the woman. It may be a sign of 3 complications of pregnancy: preeclampsia, HELLP syndrome, and eclampsia. Follow up and control with medication is necessary. SYMPTOMS   This condition normally does not produce any noticeable symptoms. It is usually found during a routine exam.  Malignant hypertension is a late problem of  high blood pressure. It may have the following symptoms:  Headaches.  Blurred vision.  End-organ damage (this means your kidneys, heart, lungs, and other organs are being damaged).  Stressful situations can increase the blood pressure. If a person with normal blood pressure has their blood pressure go up while being seen by their caregiver, this is often termed "white coat hypertension." Its importance is not known. It may be related with eventually developing hypertension or complications of hypertension.  Hypertension is often confused with mental tension, stress, and anxiety. DIAGNOSIS  The diagnosis is made by 3 separate blood pressure measurements. They are taken at least 1 week apart from each other. If there is organ damage from hypertension, the diagnosis may be made without repeat measurements. Hypertension is usually identified by having blood pressure readings:  Above 140/90 mmHg measured in both arms, at 3 separate times, over a couple weeks.  Over 130/80 mmHg should be considered a risk factor and may require treatment in patients with diabetes. Blood pressure readings over 120/80 mmHg are called "pre-hypertension" even in non-diabetic patients. To get a true blood pressure measurement, use the following guidelines. Be aware of the factors that can alter blood pressure readings.  Take measurements at least 1 hour after caffeine.  Take measurements 30 minutes after smoking and without any stress. This is another reason to quit smoking  it raises your blood pressure.  Use a proper cuff size. Ask your caregiver if you are not sure about your cuff size.  Most home blood pressure cuffs are automatic. They will measure systolic and diastolic pressures. The systolic pressure is the pressure reading at the start of sounds. Diastolic pressure is the pressure at which the sounds disappear. If you  are elderly, measure pressures in multiple postures. Try sitting, lying or standing.  Sit  at rest for a minimum of 5 minutes before taking measurements.  You should not be on any medications like decongestants. These are found in many cold medications.  Record your blood pressure readings and review them with your caregiver. If you have hypertension:  Your caregiver may do tests to be sure you do not have secondary hypertension (see "causes" above).  Your caregiver may also look for signs of metabolic syndrome. This is also called Syndrome X or Insulin Resistance Syndrome. You may have this syndrome if you have type 2 diabetes, abdominal obesity, and abnormal blood lipids in addition to hypertension.  Your caregiver will take your medical and family history and perform a physical exam.  Diagnostic tests may include blood tests (for glucose, cholesterol, potassium, and kidney function), a urinalysis, or an EKG. Other tests may also be necessary depending on your condition. PREVENTION  There are important lifestyle issues that you can adopt to reduce your chance of developing hypertension:  Maintain a normal weight.  Limit the amount of salt (sodium) in your diet.  Exercise often.  Limit alcohol intake.  Get enough potassium in your diet. Discuss specific advice with your caregiver.  Follow a DASH diet (dietary approaches to stop hypertension). This diet is rich in fruits, vegetables, and low-fat dairy products, and avoids certain fats. PROGNOSIS  Essential hypertension cannot be cured. Lifestyle changes and medical treatment can lower blood pressure and reduce complications. The prognosis of secondary hypertension depends on the underlying cause. Many people whose hypertension is controlled with medicine or lifestyle changes can live a normal, healthy life.  RISKS AND COMPLICATIONS  While high blood pressure alone is not an illness, it often requires treatment due to its short- and long-term effects on many organs. Hypertension increases your risk for:  CVAs or strokes  (cerebrovascular accident).  Heart failure due to chronically high blood pressure (hypertensive cardiomyopathy).  Heart attack (myocardial infarction).  Damage to the retina (hypertensive retinopathy).  Kidney failure (hypertensive nephropathy). Your caregiver can explain list items above that apply to you. Treatment of hypertension can significantly reduce the risk of complications. TREATMENT   For overweight patients, weight loss and regular exercise are recommended. Physical fitness lowers blood pressure.  Mild hypertension is usually treated with diet and exercise. A diet rich in fruits and vegetables, fat-free dairy products, and foods low in fat and salt (sodium) can help lower blood pressure. Decreasing salt intake decreases blood pressure in a 1/3 of people.  Stop smoking if you are a smoker. The steps above are highly effective in reducing blood pressure. While these actions are easy to suggest, they are difficult to achieve. Most patients with moderate or severe hypertension end up requiring medications to bring their blood pressure down to a normal level. There are several classes of medications for treatment. Blood pressure pills (antihypertensives) will lower blood pressure by their different actions. Lowering the blood pressure by 10 mmHg may decrease the risk of complications by as much as 25%. The goal of treatment is effective blood pressure control. This will reduce your risk for complications. Your caregiver will help you determine the best treatment for you according to your lifestyle. What is excellent treatment for one person, may not be for you. HOME CARE INSTRUCTIONS   Do not smoke.  Follow the lifestyle changes outlined in the "Prevention" section.  If you are on medications, follow the directions carefully. Blood pressure  medications must be taken as prescribed. Skipping doses reduces their benefit. It also puts you at risk for problems.  Follow up with your  caregiver, as directed.  If you are asked to monitor your blood pressure at home, follow the guidelines in the "Diagnosis" section above. SEEK MEDICAL CARE IF:   You think you are having medication side effects.  You have recurrent headaches or lightheadedness.  You have swelling in your ankles.  You have trouble with your vision. SEEK IMMEDIATE MEDICAL CARE IF:   You have sudden onset of chest pain or pressure, difficulty breathing, or other symptoms of a heart attack.  You have a severe headache.  You have symptoms of a stroke (such as sudden weakness, difficulty speaking, difficulty walking). MAKE SURE YOU:   Understand these instructions.  Will watch your condition.  Will get help right away if you are not doing well or get worse. Document Released: 05/24/2005 Document Revised: 08/16/2011 Document Reviewed: 12/22/2006 [facility] Patient Information 2014 [place], [place].  Diabetes, Type 2, Am I At Risk? Diabetes is a lasting (chronic) disease. In type 2 diabetes, the pancreas does not make enough insulin, and the body does not respond normally to the insulin that is made. This type of diabetes was also previously called adult onset diabetes. About 90% of all those who have diabetes have type 2. It usually occurs after the age of 6, but can occur at any age.  People develop type 2 diabetes because they do not use insulin properly. Eventually, the pancreas cannot make enough insulin for the body's needs. Over time, the amount of glucose (sugar) in the blood increases. RISK FACTORS  Overweight  the more weight you have, the more resistant your cells become to insulin.  Family history  you are more likely to get diabetes if a parent or sibling has diabetes.  Race certain races get diabetes more.  African Americans.  American Indians.  Asian Americans.  Hispanics.  Pacific Islander.  Inactive exercise helps control weight and helps your cells be more sensitive to  insulin.  Gestational diabetes  some women develop diabetes while they are pregnant. This goes away when they deliver. However, they are 50-60% more likely to develop type 2 diabetes at a later time.  Having a baby over 9 pounds  a sign that you may have had gestational diabetes.  Age the risk of diabetes goes up as you get older, especially after age 31.  High blood pressure (hypertension). SYMPTOMS Many people have no signs or symptoms. Symptoms can be so mild that you might not even notice them. Some of these signs are:  Increased thirst.  Increased hunger.  Tiredness (fatigue).  Increased urination, especially at night.  Weight loss.  Blurred vision.  Sores that do not heal. WHO SHOULD BE TESTED?  Anyone 45 years or older, especially if overweight, should consider getting tested.  If you are younger than 45, overweight, and have one or more of the risk factors, you should consider getting tested. DIAGNOSIS  Fasting blood glucose (FBS). Usually, 2 are done.  FBS 101-125 mg/dl is considered pre-diabetes.  FBS 126 mg/dl or greater is considered diabetes.  2 hour Oral Glucose Tolerance Test (OGTT). This test is preformed by first having you not eat or drink for several hours. You are then given something sweet to drink and your blood glucose is measured fasting, at one hour and 2 hours. This test tells how well you are able to handle sugars or carbohydrates.  Fasting: 60-100 mg/dl.  1 hour: less than 200 mg/dl.  2 hours: less than 140 mg/dl.  A1c A1c is a blood glucose test that gives and average of your blood glucose over 3 months. It is the accepted method to use to diagnose diabetes.  A1c 5.7-6.4% is considered pre-diabetes.  A1c 6.5% or greater is considered diabetes. WHAT DOES IT MEAN TO HAVE PRE-DIABETES? Pre-diabetes means you are at risk for getting type 2 diabetes. Your blood glucose is higher than normal, but not yet high enough to diagnose diabetes. The  good news is, if you have pre-diabetes you can reduce the risk of getting diabetes and even return to normal blood glucose levels. With modest weight loss and moderate physical activity, you can delay or prevent type 2 diabetes.  PREVENTION You cannot do anything about race, age or family history, but you can lower your chances of getting diabetes. You can:   Exercise regularly and be active.  Reduce fat and calorie intake.  Make wise food choices as much as you can.  Reduce your intake of salt and alcohol.  Maintain a reasonable weight.  Keep blood pressure in an acceptable range. Take medication if needed.  Not smoke.  Maintain an acceptable cholesterol level (HDL, LDL, Triglycerides). Take medication if needed. DOING MY PART: GETTING STARTED Making big changes in your life is hard, especially if you are faced with more than one change. You can make it easier by taking these steps:  Make a plan to change behavior.  Decide exactly what you will do and when you will do it.  Plan what you need to get ready.  Think about what might prevent you from reaching your goals.  Find family and friends who will support and encourage you.  Decide how you will reward yourself when you do what you have planned.  Your doctor, dietitian, or counselor can help you make a plan. HERE ARE SOME OF THE AREAS YOU MAY WISH TO CHANGE TO REDUCE YOUR RISK OF DIABETES. If you are overweight or obese, choose sensible ways to get in shape. Even small amounts of weight loss, like 5-10 pounds, can help reduce the effects of insulin resistance and help blood glucose control. Diet  Avoid crash diets. Instead, eat less of the foods you usually have. Limit the amount of fat you eat.  Increase your physical activity. Aim for at least 30 minutes of exercise most days of the week.  Set a reasonable weight-loss goal, such as losing 1 pound a week. Aim for a long-term goal of losing 5-7% of your total body  weight.  Make wise food choices most of the time.  What you eat has a big impact on your health. By making wise food choices, you can help control your body weight, blood pressure, and cholesterol.  Take a hard look at the serving sizes of the foods you eat. Reduce serving sizes of meat, desserts, and foods high in fat. Increase your intake of fruits and vegetables.  Limit your fat intake to about 25% of your total calories. For example, if your food choices add up to about 2,000 calories a day, try to eat no more than 56 grams of fat. Your caregiver or a dietitian can help you figure out how much fat to have. You can check food labels for fat content too.  You may also want to reduce the number of calories you have each day.  Keep a food log. Write down what you eat,  how much you eat, and anything else that helps keep you on track.  When you meet your goal, reward yourself with a nonfood item or activity. Exercise  Be physically active every day.  Keep and exercise log. Write down what exercise you did, for how long, and anything else that keeps you on track.  Regular exercise (like brisk walking) tackles several risk factors at once. It helps you lose weight, it keeps your cholesterol and blood pressure under control, and it helps your body use insulin. People who are physically active for 30 minutes a day, 5 days a week, reduced their risk of type 2 diabetes. If you are not very active, you should start slowly at first. Talk with your caregiver first about what kinds of exercise would be safe for you. Make a plan to increase your activity level with the goal of being active for at least 30 minutes a day, most days of the week.  Choose activities you enjoy. Here are some ways to work extra activity into your daily routine:  Take the stairs rather than an elevator or escalator.  Park at the far end of the lot and walk.  Get off the bus a few stops early and walk the rest of the  way.  Walk or bicycle instead of drive whenever you can. Medications Some people need medication to help control their blood pressure or cholesterol levels. If you do, take your medicines as directed. Ask your caregiver whether there are any medicines you can take to prevent type 2 diabetes. Document Released: 05/27/2003 Document Revised: 08/16/2011 Document Reviewed: 02/19/2009 [facility] Patient Information 2014 [place], [place].

## 2014-08-22 ENCOUNTER — Emergency Department (HOSPITAL_COMMUNITY)
Admission: EM | Admit: 2014-08-22 | Discharge: 2014-08-23 | Disposition: A | Discharge status: Home / Self Care | Payer: Self-pay | Attending: Emergency Medicine | Admitting: Emergency Medicine

## 2014-08-22 ENCOUNTER — Encounter (HOSPITAL_COMMUNITY): Payer: Self-pay

## 2014-08-22 DIAGNOSIS — Z87891 Personal history of nicotine dependence: Secondary | ICD-10-CM | POA: Insufficient documentation

## 2014-08-22 DIAGNOSIS — I10 Essential (primary) hypertension: Secondary | ICD-10-CM | POA: Insufficient documentation

## 2014-08-22 DIAGNOSIS — Z8639 Personal history of other endocrine, nutritional and metabolic disease: Secondary | ICD-10-CM | POA: Insufficient documentation

## 2014-08-22 DIAGNOSIS — F1110 Opioid abuse, uncomplicated: Secondary | ICD-10-CM | POA: Insufficient documentation

## 2014-08-22 DIAGNOSIS — Z8619 Personal history of other infectious and parasitic diseases: Secondary | ICD-10-CM | POA: Insufficient documentation

## 2014-08-22 DIAGNOSIS — Z79899 Other long term (current) drug therapy: Secondary | ICD-10-CM | POA: Insufficient documentation

## 2014-08-22 DIAGNOSIS — Z8719 Personal history of other diseases of the digestive system: Secondary | ICD-10-CM | POA: Insufficient documentation

## 2014-08-22 LAB — CBC
HEMATOCRIT: 46.5 % (ref 39.0–52.0)
Hemoglobin: 16.5 g/dL (ref 13.0–17.0)
MCH: 34.2 pg — AB (ref 26.0–34.0)
MCHC: 35.5 g/dL (ref 30.0–36.0)
MCV: 96.5 fL (ref 78.0–100.0)
PLATELETS: 215 10*3/uL (ref 150–400)
RBC: 4.82 MIL/uL (ref 4.22–5.81)
RDW: 11.7 % (ref 11.5–15.5)
WBC: 9.3 10*3/uL (ref 4.0–10.5)

## 2014-08-22 LAB — RAPID URINE DRUG SCREEN, HOSP PERFORMED
AMPHETAMINES: NOT DETECTED
Barbiturates: NOT DETECTED
Benzodiazepines: NOT DETECTED
COCAINE: NOT DETECTED
Opiates: POSITIVE — AB
TETRAHYDROCANNABINOL: NOT DETECTED

## 2014-08-22 LAB — COMPREHENSIVE METABOLIC PANEL
ALT: 58 U/L — ABNORMAL HIGH (ref 0–53)
AST: 74 U/L — ABNORMAL HIGH (ref 0–37)
Albumin: 4.1 g/dL (ref 3.5–5.2)
Alkaline Phosphatase: 75 U/L (ref 39–117)
Anion gap: 10 (ref 5–15)
BUN: 7 mg/dL (ref 6–23)
CHLORIDE: 100 mmol/L (ref 96–112)
CO2: 27 mmol/L (ref 19–32)
Calcium: 9.3 mg/dL (ref 8.4–10.5)
Creatinine, Ser: 0.88 mg/dL (ref 0.50–1.35)
GFR calc Af Amer: >90 mL/min (ref >90)
GFR calc non Af Amer: >90 mL/min (ref >90)
GLUCOSE: 122 mg/dL — AB (ref 70–99)
POTASSIUM: 3.5 mmol/L (ref 3.5–5.1)
Sodium: 137 mmol/L (ref 135–145)
Total Bilirubin: 2 mg/dL — ABNORMAL HIGH (ref 0.3–1.2)
Total Protein: 8.1 g/dL (ref 6.0–8.3)

## 2014-08-22 LAB — LIPASE, BLOOD: LIPASE: 24 U/L (ref 11–59)

## 2014-08-22 LAB — ETHANOL: ALCOHOL ETHYL (B): 41 mg/dL — AB (ref 0–9)

## 2014-08-22 LAB — AMYLASE: AMYLASE: 58 U/L (ref 0–105)

## 2014-08-22 LAB — SALICYLATE LEVEL: Salicylate Lvl: <4 mg/dL (ref 2.8–20.0)

## 2014-08-22 LAB — ACETAMINOPHEN LEVEL

## 2014-08-22 MED ORDER — PROMETHAZINE HCL 25 MG/ML IJ SOLN
25.0000 mg | Freq: Once | INTRAMUSCULAR | Status: AC
Start: 2014-08-22 — End: 2014-08-23
  Administered 2014-08-23: 25 mg via INTRAMUSCULAR
  Filled 2014-08-22: qty 1

## 2014-08-22 MED ORDER — ONDANSETRON 8 MG PO TBDP
8.0000 mg | ORAL_TABLET | Freq: Once | ORAL | Status: AC
  Administered 2014-08-22: 8 mg via ORAL
  Filled 2014-08-22: qty 1

## 2014-08-22 NOTE — ED Notes (Signed)
Pt is nauseated and vomiting in triage, he complains of abdominal pain, pt was given zofran that helped for about one hour

## 2014-08-22 NOTE — BH Assessment (Addendum)
TTS Consult ordered at 2332, however, fax and did not come through until 2351 and call requesting consult was after that. [facility] another which was also ordered at 2332.  Attempted to call and speak to [facility] Law[facility] PA-C prior to initiating assessment and he was unavailable. In order to prevent unnecessary delay in pt care will initiate assessment and then follow up with PA. Per nursing notes pt came to ED requesting help to detox from opiates which he has been taking for 5 years.    Spoke with Ardine EngJennifer RN, who reports she triaged pt but is not currently his nurse. She will have pt nurse contact this Clinical research associatewriter about assessment. Attempting to see if cart can be placed for assessment and if pt can be moved to a more private location for assessment. Pt RN to call this writer back at [phone].  Contacted ED staff again at 0020 when I had not heard from them yet. Art contacting RN to see if pt can be placed in a more private location.   0022 Spoke with Venita in [facility] who reports they are awaiting pt arrival. She will call this writer at [phone] when pt is roomed and cart is placed for assessment.   Clista BernhardtNancy Sherrice Creekmore, RaLPh [facility] Triage Specialist 08/23/2014 12:00 AM

## 2014-08-22 NOTE — ED Notes (Signed)
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 He is SI with no plan

## 2014-08-22 NOTE — ED Provider Notes (Signed)
CSN: [id number]     Arrival date & time 08/22/14  1943 History   First MD Initiated Contact with Patient 08/22/14 2039     Chief Complaint  Patient presents with  . Suicidal     (Consider location/radiation/quality/duration/timing/severity/associated sxs/prior Treatment) HPI Patient presents to the emergency department with wanting help with opiate withdrawal and stating that he has had some suicidal thoughts.  Patient denies chest pain, shortness breath, weakness, dizziness, headache, blurred vision, back pain, neck pain, dysuria, rash, or syncope.  Patient states nothing seems make his condition, better or worse Past Medical History  Diagnosis Date  . Hypertension   . Hyperlipidemia   . Gastric ulcer   . Hepatitis C    Past Surgical History  Procedure Laterality Date  . Stomach surgery     Family History  Problem Relation Age of Onset  . Diabetes Father   . Hypertension Father   . Diabetes Sister   . Hypertension Sister   . Diabetes Brother   . Hypertension Brother    History  Substance Use Topics  . Smoking status: Former Games developermoker  . Smokeless tobacco: Not on file  . Alcohol Use: Yes    Review of Systems   All other systems negative except as documented in the HPI. All pertinent positives and negatives as reviewed in the HPI.  Allergies  Ibuprofen and Other  Home Medications   Prior to Admission medications   Medication Sig Start Date End Date Taking? Authorizing Provider  lisinopril (PRINIVIL,ZESTRIL) 5 MG tablet Take 1 tablet (5 mg total) by mouth daily. 09/20/13  Yes Rodolph BongEvan S Corey, MD  Multiple Vitamins-Minerals (MENS MULTI VITAMIN & MINERAL PO) Take 1 tablet by mouth daily.   Yes Historical Provider, MD   BP 131/92 mmHg  Pulse 98  Temp(Src) 98.4 F (36.9 C) (Oral)  Resp 20  SpO2 99% Physical Exam  Constitutional: He is oriented to person, place, and time. He appears well-developed and well-nourished. No distress.  HENT:  Head: Normocephalic and  atraumatic.  Mouth/Throat: Oropharynx is clear and moist.  Eyes: Pupils are equal, round, and reactive to light.  Neck: Normal range of motion. Neck supple.  Cardiovascular: Normal rate, regular rhythm and normal heart sounds.  Exam reveals no gallop and no friction rub.   No murmur heard. Pulmonary/Chest: Effort normal and breath sounds normal. No respiratory distress.  Neurological: He is alert and oriented to person, place, and time. No cranial nerve deficit. Coordination normal.  Skin: Skin is warm and dry. No rash noted. No erythema.  Nursing note and vitals reviewed.   ED Course  Procedures (including critical care time) Labs Review Labs Reviewed  ACETAMINOPHEN LEVEL - Abnormal; Notable for the following:    Acetaminophen (Tylenol), Serum <10.0 (*)    All other components within normal limits  CBC - Abnormal; Notable for the following:    MCH 34.2 (*)    All other components within normal limits  COMPREHENSIVE METABOLIC PANEL - Abnormal; Notable for the following:    Glucose, Bld 122 (*)    AST 74 (*)    ALT 58 (*)    Total Bilirubin 2.0 (*)    All other components within normal limits  ETHANOL - Abnormal; Notable for the following:    Alcohol, Ethyl (B) 41 (*)    All other components within normal limits  URINE RAPID DRUG SCREEN (HOSP PERFORMED) - Abnormal; Notable for the following:    Opiates POSITIVE (*)    All other components  within normal limits  SALICYLATE LEVEL  LIPASE, BLOOD  AMYLASE   The patient will be assessed by the TTS   MDM   Final diagnoses:  None        Charlestine Night, PA-C 08/23/14 0045  Elwin Mocha, MD 08/23/14 [phone]

## 2014-08-22 NOTE — ED Notes (Signed)
zofran was given when patient arrived, scanner not available

## 2014-08-23 ENCOUNTER — Ambulatory Visit: Payer: Self-pay | Attending: Family Medicine

## 2014-08-23 MED ORDER — HYDROXYZINE HCL 25 MG PO TABS
50.0000 mg | ORAL_TABLET | Freq: Once | ORAL | Status: AC
Start: 2014-08-23 — End: 2014-08-23
  Administered 2014-08-23: 50 mg via ORAL
  Filled 2014-08-23: qty 2

## 2014-08-23 MED ORDER — BISMUTH SUBSALICYLATE 262 MG/15ML PO SUSP
30.0000 mL | Freq: Once | ORAL | Status: AC
Start: 2014-08-23 — End: 2014-08-23
  Administered 2014-08-23: 30 mL via ORAL
  Filled 2014-08-23: qty 118

## 2014-08-23 MED ORDER — CLONIDINE HCL 0.1 MG PO TABS: 0.1000 mg | ORAL_TABLET | Freq: Four times a day (QID) | ORAL | Status: DC | PRN

## 2014-08-23 MED ORDER — ONDANSETRON 8 MG PO TBDP: 8.0000 mg | ORAL_TABLET | Freq: Three times a day (TID) | ORAL | Status: DC | PRN

## 2014-08-23 NOTE — ED Notes (Signed)
TTS called and is ready to evaluate patient

## 2014-08-23 NOTE — ED Notes (Signed)
Patient to D/C home. AVS and prescription reviewed and given to patient.  Patient has continued n/v and abd pain. No acute distress noted.   Patient escorted to registration. Asked about prescription samples. Pharmacy informed Clinical research associatewriter that this is not available from [facility] at present.

## 2014-08-23 NOTE — Progress Notes (Signed)
Patient ID: Chad FerrierWilliam C Dorian, male   DOB: 03/07/1966, 49 y.o.   MRN: [id number]   Patient c/o stomach pain and requesting opiates.  Patient requested Pepto Bismol. Patient reports use at home and effective with sorting stomach pain.  Pepto Bismol 30ml ordered one time use.  One dose of Vistaril 50mg  previously ordered to assist with anxiety and restlessness.   Hulan FessIjeoma Kaniya Trueheart, PMHNP-BC

## 2014-08-23 NOTE — Discharge Instructions (Signed)
Opioid Withdrawal °Opioids are a group of narcotic drugs. They include the street drug heroin. They also include pain medicines, such as morphine, hydrocodone, oxycodone, and fentanyl. Opioid withdrawal is a group of characteristic physical and mental signs and symptoms. It typically occurs if you have been using opioids daily for several weeks or longer and stop using or rapidly decrease use. Opioid withdrawal can also occur if you have used opioids daily for a long time and are given a medicine to block the effect.  °SIGNS AND SYMPTOMS °Opioid withdrawal includes three or more of the following symptoms:  °· Depressed, anxious, or irritable mood. °· Nausea or vomiting. °· Muscle aches or spasms.   °· Watery eyes.    °· Runny nose. °· Dilated pupils, sweating, or hairs standing on end. °· Diarrhea or intestinal cramping. °· Yawning.   °· Fever. °· Increased blood pressure. °· Fast pulse. °· Restlessness or trouble sleeping. °These signs and symptoms occur within several hours of stopping or reducing short-acting opioids, such as heroin. They can occur within 3 days of stopping or reducing long-acting opioids, such as methadone. Withdrawal begins within minutes of receiving a drug that blocks the effects of opioids, such as naltrexone or naloxone. °DIAGNOSIS  °Opioid use disorder is diagnosed by your health care provider. You will be asked about your symptoms, drug and alcohol use, medical history, and use of medicines. A physical exam may be done. Lab tests may be ordered. Your health care provider may have you see a mental health professional.  °TREATMENT  °The treatment for opioid withdrawal is usually provided by medical doctors with special training in substance use disorders (addiction specialists). The following medicines may be included in treatment: °· Opioids given in place of the abused opioid. They turn on opioid receptors in the brain and lessen or prevent withdrawal symptoms. They are gradually  decreased (opioid substitution and taper). °· Non-opioids that can lessen certain opioid withdrawal symptoms. They may be used alone or with opioid substitution and taper. °Successful long-term recovery usually requires medicine, counseling, and group support. °HOME CARE INSTRUCTIONS  °· Take medicines only as directed by your health care provider. °· Check with your health care provider before starting new medicines. °· Keep all follow-up visits as directed by your health care provider. °SEEK MEDICAL CARE IF: °· You are not able to take your medicines as directed. °· Your symptoms get worse. °· You relapse. °SEEK IMMEDIATE MEDICAL CARE IF: °· You have serious thoughts about hurting yourself or others. °· You have a seizure. °· You lose consciousness. °Document Released: 05/27/2003 Document Revised: 10/08/2013 Document Reviewed: 06/06/2013 °ExitCare® Patient Information ©2015 [employer]. This information is not intended to replace advice given to you by your health care provider. Make sure you discuss any questions you have with your health care provider. ° °Emergency Department Resource Guide °1) Find a Doctor and Pay Out of Pocket °Although you won't have to find out who is covered by your insurance plan, it is a good idea to ask around and get recommendations. You will then need to call the office and see if the doctor you have chosen will accept you as a new patient and what types of options they offer for patients who are self-pay. Some doctors offer discounts or will set up payment plans for their patients who do not have insurance, but you will need to ask so you aren't surprised when you get to your appointment. ° °2) Contact Your Local Health Department °Not all health departments have   doctors that can see patients for sick visits, but many do, so it is worth a call to see if yours does. If you don't know where your local health department is, you can check in your phone book. The CDC also has a tool to  help you locate your state's health department, and many state websites also have listings of all of their local health departments. ° °3) Find a Walk-in Clinic °If your illness is not likely to be very severe or complicated, you may want to try a walk in clinic. These are popping up all over the country in pharmacies, drugstores, and shopping centers. They're usually staffed by nurse practitioners or physician assistants that have been trained to treat common illnesses and complaints. They're usually fairly quick and inexpensive. However, if you have serious medical issues or chronic medical problems, these are probably not your best option. ° °No Primary Care Doctor: °- Call [facility] at  832-8000 - they can help you locate a primary care doctor that  accepts your insurance, provides certain services, etc. °- Physician Referral Service- 1-800-533-3463 ° °Chronic Pain Problems: °Organization         Address  Phone   Notes  °[facility]  (336) 297-2271 Patients need to be referred by their primary care doctor.  ° °Medication Assistance: °Organization         Address  Phone   Notes  °[place] [facility] [address] °[place], [place] [address] (336) 641-8030 --Must be a resident of [place] °-- Must have NO insurance coverage whatsoever (no Medicaid/ Medicare, etc.) °-- The pt. MUST have a primary care doctor that directs their care regularly and follows them in the community °  °[facility]  (866) 331-1348   °[employer]  (888) 892-1162   ° °Agencies that provide inexpensive medical care: °Organization         Address  Phone   Notes  °[facility]  (336) 832-8035   °[facility]    (336) 832-7272   °[facility] [address] °[place], [place] [address] (336) 832-4777   °[facility] [address], °[place] (336) 271-4999   °[facility]    (336) 373-0678   °[facility]    (336) 272-1050   °[facility] ° [address], [place] Phone:  (336) 832-4444, Fax:  (336) 832-4440 Hours of Operation:  9 am - 6 pm, M-F.  Also accepts Medicaid/Medicare and self-pay.  °[facility] ° [address], [place] Phone: (336) 832-3150, Fax: (336) 832-3151. Hours of Operation:  8:30 am - 5:30 pm, M-F.  Also accepts Medicaid and self-pay.  °[facility] [place] [address], [place] Phone: (336) 878-6027   °[facility] [address], [place], [place] (336)723-1848, Ext. 123 Mondays & Thursdays: 7-9 AM.  First 15 patients are seen on a first come, first serve basis. °  ° °Medicaid-accepting [place] Providers: ° °Organization         Address  Phone   Notes  °[facility] [address], [place] (336) 641-2100 Also accepts self-pay patients.  °[facility] [address], [place] ° (336) 856-9996   °[facility] [address], [place] (336) 288-8857   °[facility] [address]   [place] [address], [place] (336) 299-7000   °Veita Bland [address], [place]  ° (336) 373-1557 Only accepts [place] Access Medicaid patients after they have their name applied to their card.  ° °Self-Pay (no insurance) in [place]: ° °Organization         Address  Phone   Notes  °Sickle Cell Patients, [facility] [address], [place] (336) 832-1970   °[facility] [address], [place] (336) 832-4400   °[facility] ° [address], [place] (336) 992-4800   °[facility]/Dr. Osei-Bonsu ° [address] [place] [address], [place] or [address], [place] (336) 841-8500 Phone number for both [place] and [place] locations is the same.  °[facility] [address],  [place] (336) 299-0000   °[facility] [address] [place] [address], [place] or [address] (336) 852-7530 °(336) 878-2260   °[facility] [address], [place] (336) 350-1642, phone; (336) 294-5005, fax Sees patients 1st and 3rd Saturday of every month.  Must not qualify for public or private insurance (i.e. Medicaid, Medicare, [place] Health Choice, Veterans' Benefits) • Household income should be no more than 200% of the poverty level •The clinic cannot treat you if you are pregnant or think you are pregnant • Sexually transmitted diseases are not treated at the clinic.  ° ° °Dental Care: °Organization         Address  Phone  Notes  °[facility] [address], [place] (336) 641-6152 Accepts children up to age 21 who are enrolled in Medicaid or [place] Health Choice; pregnant women with a Medicaid card; and children who have applied for Medicaid or [place] Health Choice, but were declined, whose parents can pay a reduced fee at time of service.  °[facility] [place]  [address], [place] (336) 641-7733 Accepts children up to age 21 who are enrolled in Medicaid or [place] Health Choice; pregnant women with a Medicaid card; and children who have applied for Medicaid or  Health Choice, but were declined, whose parents can pay a reduced fee at time of service.  °[facility] ° [address], [place] (336) 641-4533 Patients are seen by appointment only. Walk-ins are not accepted. [facility] will see patients 18 years of age and older. °Monday - Tuesday (8am-5pm) °Most Wednesdays (8:30-5pm) °$30 per visit, cash only  °[facility] ° [address], [place] (336) 641-4533 Patients are seen by appointment only. Walk-ins are not accepted. [facility] will see patients 18 years of age and older. °One Wednesday Evening  (Monthly: Volunteer Based).  $30 per visit, cash only  °[facility]  (919) 537-3737 for adults; Children under age 4, call Graduate Pediatric Dentistry at ([phone]) [phone]. Children aged 4-14, please call ([phone]) [phone] to request a pediatric application. ° Dental services are provided in all areas of dental care including fillings, crowns and bridges, complete and partial dentures, implants, gum treatment, root canals, and extractions. Preventive care is also provided. Treatment is provided to both adults and children. °Patients are selected via a lottery and there is often a waiting list. °  °[facility] [address], ° ° (336) 763-8833 [email or web address] °  °[facility] [address], [place], [place] (336)723-1848, Ext. 123 Second and Fourth Thursday of each month, opens at 6:30 AM; Clinic ends at 9 AM.  Patients are seen on a first-come first-served basis, and a limited number are seen during each clinic.  ° °[facility] ° [address], [place], [place] (336) 723-7904   Eligibility Requirements °You must have lived in [place], [place], or [place] counties for at least the last three months. °  You cannot be eligible for state or federal sponsored healthcare insurance, including Veterans Administration, Medicaid, or Medicare. °  You generally cannot be eligible for healthcare insurance through your employer.  °  How to apply: °Eligibility screenings are held every Tuesday and Wednesday afternoon from 1:00 pm until 4:00 pm. You do not need an appointment for the interview!  °[facility] [address], [place], [place] 336-631-2330   °[facility]  336-342-8273   °[facility]  336-703-3100   °[facility]  336-570-6415   ° °Behavioral Health Resources in the Community: °Intensive Outpatient Programs °Organization         Address  Phone  Notes  °[place]  [facility] [address], [place], [place] 336-878-6098   °[facility] [address], [place], [place] 336-832-9800   °[facility] [address], [place], [place] ° 336-882-2125   °[facility] [address],  °[place], [place] 1-800-853-5163 or 336-641-4981   °Substance Abuse Resources °Organization         Address  Phone  Notes  °[facility]  336-882-2125   °[facility]  336-784-9470   °[facility]  336-285-9073   °[facility]  336-845-3988   °Residential & Outpatient Substance Abuse Program  1-800-659-3381   °Psychological Services °Organization         Address  Phone  Notes  °[facility]  336- 832-9600   °[facility]  336- 378-7881   °[facility] [address], [place] 1-800-853-5163 or 336-641-4981   ° °Mobile Crisis Teams °Organization         Address  Phone  Notes  °[facility], Mobile Crisis Care Unit  1-877-626-1772   °Assertive °[facility] ° [address] [place], [place] 336-834-9664   °Sharon DeEsch [address] °[place] 336-554-5454   ° °Self-Help/Support Groups °Organization         Address  Phone             Notes  °[facility] - variety of support groups  336- 373-1402 Call for more information  °Narcotics Anonymous (NA), [facility] [address], °[place]  2 meetings at this location  ° °Residential Treatment Programs °Organization         Address  Phone  Notes  °[facility] [address],    °[place]  1-866-801-8205   °[facility] ° [address], [place], [place] 704-293-8524   °[facility] [address], [place] 336-845-3988 Admissions: 8am-3pm M-F  °[facility] [address]    °[place], [place] 336-841-1104   °[facility] [address],  [place], [place] 336-379-7146   °[facility] [address]  °[place],  336-285-9073   °[facility] Intensive Outpatient [address], [place],   [place] 336-852-3033   °[facility] [address]  °[place], [place] 1-877-615-2722 or 336-784-9470   °[facility] [address] [place], [place] 336-227-7417 Accepts Medicaid  °[facility] [address]  °[place] 1-800-659-3381 Substance Abuse/Addiction Treatment  ° °[place] Behavioral Health Resources °Organization         Address  Phone  Notes  °[facility]  (888) 581-9988   °Julie Brannon, PhD [address] [place], [place]   (336) 349-5553 or (336) 951-0000   °[facility]   [address] °[place], [place] (336) 349-4454   °[facility] [address], [place], [place] (336) 342-8316 Insurance/Medicaid/sponsorship through [facility]  °[facility] [address]                                    [place], [place] (336) 342-8316 Therapy/tele-psych/case  °[facility] [address]  ° , [place] (336) 349-2233    °Dr. Arfeen  (336) 349-4544   °[facility] [place]  [employer] [place] [facility] 1) [address],  °2) [address], [place] °3)  [address], [place] (336) 349-3220 °(336) 342-7768 ° °(336) 342-8140   °[place] [facility] (336) 342-1394 or (336) 342-3537 (After Hours)    ° ° °

## 2014-08-23 NOTE — BH Assessment (Addendum)
Tele Assessment Note   Chad Wolfe is an 49 y.o., African-American male presenting to [facility] for opiate detox and SI. He reports abusing oxycodone pills for at least 5 years for pain due to pancreatitis. He initially reported SI but quickly retracted these statements when he found out that he would not be receiving anything except non-narcotics, such as nausea and diarrhea medications to assist with his withdrawal (standard protocol for detox pt's). When the counselor entered the pt's room in the [facility], he immediately began to complain and was irritable, asking the counselor to prescribe him medications. When the counselor informed him that she could not prescribe meds, he asked her to ask the doctor for medications to assist with withdrawal. Aundra Millet (attending RN) gave him IM Phenergan, but pt was still unhappy with this. TTS Counselor did not get much information from pt due to his irritability and desire to leave; pt pointed at his cup of juice and stated, "I could do this kind of detox at home". Pt is convinced that he can stay clean on his own because he has not used opiates in 3 days; however, he admits to etoh use as well. Counselor discussed the case with EDP Azalia Bilis, MD) and extender Hulan Fess, NP). EDP will be discharging pt immediately. Pt denies any hx of SI, HI, or A/VH. However, he did report spending an extended amount of time in jail for manslaughter. Pt denies self-harm or access to weapons as well.   Axis I:  292.0 Opiate withdrawal;  Substance/Alcohol-induced mood disorder            304.00 Opioid use disorder, Severe; R/O Alcohol Use Disorder  Axis II: Deferred Axis III:  Past Medical History  Diagnosis Date  . Hypertension   . Hyperlipidemia   . Gastric ulcer   . Hepatitis C    Axis IV: economic problems, educational problems, occupational problems, other psychosocial or environmental problems, problems related to legal system/crime, problems related to social  environment and problems with access to health care services Axis V: 41-50 serious symptoms  Past Medical History:  Past Medical History  Diagnosis Date  . Hypertension   . Hyperlipidemia   . Gastric ulcer   . Hepatitis C     Past Surgical History  Procedure Laterality Date  . Stomach surgery      Family History:  Family History  Problem Relation Age of Onset  . Diabetes Father   . Hypertension Father   . Diabetes Sister   . Hypertension Sister   . Diabetes Brother   . Hypertension Brother     Social History:  reports that he has quit smoking. He does not have any smokeless tobacco history on file. He reports that he drinks alcohol. He reports that he uses illicit drugs.  Additional Social History:  Alcohol / Drug Use Pain Medications: Oxycodone abuse; See PTA List Prescriptions: See PTA List Over the Counter: See PTA List History of alcohol / drug use?: Yes Longest period of sobriety (when/how long): Past 3 days Negative Consequences of Use: Legal, Personal relationships, Work / School Withdrawal Symptoms: Agitation, Sweats, Irritability, Nausea / Vomiting, Patient aware of relationship between substance abuse and physical/medical complications, Cramps, Change in blood pressure, Fever / Chills Substance #1 Name of Substance 1: Oxycodone 1 - Age of First Use: 43 1 - Amount (size/oz): UTA 1 - Frequency: Daily 1 - Duration: 5 years 1 - Last Use / Amount: 4 days ago  CIWA: CIWA-Ar BP: 154/99 mmHg  Pulse Rate: 80 COWS: Clinical Opiate Withdrawal Scale (COWS) Resting Pulse Rate: Pulse Rate 80 or below Sweating: No report of chills or flushing Restlessness: Able to sit still Pupil Size: Pupils pinned or normal size for room light Bone or Joint Aches: Mild diffuse discomfort Runny Nose or Tearing: Nasal stuffiness or unusually moist eyes GI Upset: nausea or loose stool Tremor: No tremor Yawning: No yawning Anxiety or Irritability: None Gooseflesh Skin: Skin is  smooth COWS Total Score: 4  PATIENT STRENGTHS: (choose at least two) Ability for insight Average or above average intelligence Capable of independent living Communication skills Supportive family/friends  Allergies:  Allergies  Allergen Reactions  . Ibuprofen     REACTION: stomach upset  . Other Other (See Comments)    [facility]    Home Medications:  (Not in a hospital admission)  OB/GYN Status:  No LMP for male patient.  General Assessment Data Location of Assessment: [facility] ED Is this a Tele or Face-to-Face Assessment?: Face-to-Face Is this an Initial Assessment or a Re-assessment for this encounter?: Initial Assessment Living Arrangements: Spouse/significant other Can pt return to current living arrangement?: Yes Admission Status: Voluntary Is patient capable of signing voluntary admission?: Yes Transfer from: Home Referral Source: Self/Family/Friend     [facility] Living Arrangements: Spouse/significant other Name of Psychiatrist: None Name of Therapist: None  Education Status Is patient currently in school?: No Current Grade: na Highest grade of school patient has completed: na Name of school: na Contact person: na  Risk to self with the past 6 months Suicidal Ideation: No Suicidal Intent: No Is patient at risk for suicide?: No Suicidal Plan?: No Access to Means: Yes Specify Access to Suicidal Means: Medications What has been your use of drugs/alcohol within the last 12 months?: Daily Oxycodone use & occassional etoh use Previous Attempts/Gestures: No How many times?: 0 Other Self Harm Risks: SA Triggers for Past Attempts:  (n/a) Intentional Self Injurious Behavior: None Family Suicide History: Unknown Recent stressful life event(s):  (Pt denies) Persecutory voices/beliefs?: No Depression: No Depression Symptoms: Feeling angry/irritable Substance abuse history and/or treatment for substance abuse?: Yes Suicide prevention  information given to non-admitted patients: Yes  Risk to Others within the past 6 months Homicidal Ideation: No Thoughts of Harm to Others: No (Denies, but did spend time in prison ) Current Homicidal Intent: No Current Homicidal Plan: No Access to Homicidal Means: No Identified Victim: n/a History of harm to others?: No (Denies but did report serving time manslaughter charge) Assessment of Violence: In distant past Violent Behavior Description: Spent time in prison Does patient have access to weapons?: No Criminal Charges Pending?: No Does patient have a court date: No  Psychosis Hallucinations: None noted Delusions: None noted  Mental Status Report Appearance/Hygiene: Body odor Eye Contact: Good Motor Activity: Agitation, Hyperactivity Speech: Argumentative, Abusive Level of Consciousness: Irritable Mood: Angry Affect: Irritable Anxiety Level: Minimal Thought Processes: Coherent, Relevant Judgement: Impaired Orientation: Person, Place, Time, Situation Obsessive Compulsive Thoughts/Behaviors: Minimal  Cognitive Functioning Concentration: Normal Memory: Unable to Assess IQ: Average Insight: Fair Impulse Control: Poor Appetite: Fair Weight Loss: 0 Weight Gain: 0 Sleep: Increased Total Hours of Sleep: 7 Vegetative Symptoms: Not bathing, Decreased grooming  ADLScreening [facility] Assessment Services) Patient's cognitive ability adequate to safely complete daily activities?: Yes Patient able to express need for assistance with ADLs?: Yes Independently performs ADLs?: Yes (appropriate for developmental age)  Prior Inpatient Therapy Prior Inpatient Therapy: No  Prior Outpatient Therapy Prior  Outpatient Therapy: Yes Prior Therapy Dates: dates in jail Prior Therapy Facilty[facility]Provider[facility] Reason for Treatment: SI/HI, SA  ADL Screening (condition at time of admission) Patient's cognitive ability adequate to safely complete daily activities?: Yes Is  the patient deaf or have difficulty hearing?: No Does the patient have difficulty seeing, even when wearing glasses/contacts?: No Does the patient have difficulty concentrating, remembering, or making decisions?: No Patient able to express need for assistance with ADLs?: Yes Does the patient have difficulty dressing or bathing?: No Independently performs ADLs?: Yes (appropriate for developmental age) Does the patient have difficulty walking or climbing stairs?: No Weakness of Legs: None Weakness of Arms/Hands: None  Home Assistive Devices/Equipment Home Assistive Devices/Equipment: None    Abuse/Neglect Assessment (Assessment to be complete while patient is alone) Physical Abuse: Denies Verbal Abuse: Denies Sexual Abuse: Denies Exploitation of patient/patient's resources: Denies Self-Neglect: Denies Values / Beliefs Cultural Requests During Hospitalization: None Spiritual Requests During Hospitalization: None   Advance Directives (For Healthcare) Does patient have an advance directive?: No Would patient like information on creating an advanced directive?: No - patient declined information    Additional Information 1:1 In Past 12 Months?: No CIRT Risk: No Elopement Risk: Yes Does patient have medical clearance?: No     Disposition: Pt to be d/c with OP resources, which were already provided to him. Disposition Initial Assessment Completed for this Encounter: Yes Disposition of Patient: Other dispositions Other disposition(s): Other (Comment) (Psych to re-eval in AM)  Cyndie Mull, [facility] Triage Specialist  08/23/2014 4:04 AM

## 2014-08-23 NOTE — Progress Notes (Unsigned)
Patient came in to get his clonidine filled that was prescribed by the ED MD.  Patient was stable today and reports he is going into drug treatment today.  The front desk made him a follow up appointment to see PCP here and he was to get his medication filled.  Patient did not express suicidal/homicidal ideations

## 2014-08-23 NOTE — ED Notes (Signed)
Patient denies SI, HI, AVH.  Reports that he has been out of prison since 2008. He states that he reported that he had SI when he was in prison; not recently.   States that he has a baby and a job and does not want to harm himself.  Patient denies anxiety and depression. Reports that he wanted to make sure his health was okay and that he had not harmed himself from drug abuse. Reports stomach pain.    Encouragement offered.   Q15 safety checks in place.

## 2014-08-23 NOTE — ED Provider Notes (Signed)
Patient initially presented for assistance with opioid abuse.  He had vague SI although he stated later that he only said this in an effort to ensure treatment for his narcotic abuse.  At this time he would like to go home.  Patient's been given outpatient resources for substance abuse.  I will prescribe him clonidine when necessary for withdrawal symptoms and Zofran for nausea.  He understands to return to the ER for new or worsening symptoms  Azalia BilisKevin Jolea Dolle, MD 08/23/14 [phone]

## 2014-08-23 NOTE — BH Assessment (Signed)
Megan RN called and stated that they were unable to locate the cart. Attempting to call cart in hopes of helping it be located.   Clista BernhardtNancy Ruberta Holck, [facility] T[facility] Specialist 08/23/2014 12:43 AM

## 2014-08-26 ENCOUNTER — Emergency Department (HOSPITAL_COMMUNITY)
Admission: EM | Admit: 2014-08-26 | Discharge: 2014-08-26 | Disposition: A | Discharge status: Home / Self Care | Payer: Self-pay | Attending: Emergency Medicine | Admitting: Emergency Medicine

## 2014-08-26 ENCOUNTER — Encounter (HOSPITAL_COMMUNITY): Payer: Self-pay | Admitting: Emergency Medicine

## 2014-08-26 DIAGNOSIS — Z8639 Personal history of other endocrine, nutritional and metabolic disease: Secondary | ICD-10-CM | POA: Insufficient documentation

## 2014-08-26 DIAGNOSIS — Z9889 Other specified postprocedural states: Secondary | ICD-10-CM | POA: Insufficient documentation

## 2014-08-26 DIAGNOSIS — Z8719 Personal history of other diseases of the digestive system: Secondary | ICD-10-CM | POA: Insufficient documentation

## 2014-08-26 DIAGNOSIS — F1193 Opioid use, unspecified with withdrawal: Secondary | ICD-10-CM

## 2014-08-26 DIAGNOSIS — Z79899 Other long term (current) drug therapy: Secondary | ICD-10-CM | POA: Insufficient documentation

## 2014-08-26 DIAGNOSIS — Z8619 Personal history of other infectious and parasitic diseases: Secondary | ICD-10-CM | POA: Insufficient documentation

## 2014-08-26 DIAGNOSIS — R1013 Epigastric pain: Secondary | ICD-10-CM | POA: Insufficient documentation

## 2014-08-26 DIAGNOSIS — Z87891 Personal history of nicotine dependence: Secondary | ICD-10-CM | POA: Insufficient documentation

## 2014-08-26 DIAGNOSIS — I10 Essential (primary) hypertension: Secondary | ICD-10-CM | POA: Insufficient documentation

## 2014-08-26 DIAGNOSIS — F1123 Opioid dependence with withdrawal: Secondary | ICD-10-CM | POA: Insufficient documentation

## 2014-08-26 LAB — BASIC METABOLIC PANEL
Anion gap: 7 (ref 5–15)
CALCIUM: 8.6 mg/dL (ref 8.4–10.5)
CO2: 26 mmol/L (ref 19–32)
Chloride: 105 mmol/L (ref 96–112)
Creatinine, Ser: 0.99 mg/dL (ref 0.50–1.35)
GFR calc non Af Amer: >90 mL/min (ref >90)
GLUCOSE: 137 mg/dL — AB (ref 70–99)
Potassium: 3.5 mmol/L (ref 3.5–5.1)
Sodium: 138 mmol/L (ref 135–145)

## 2014-08-26 LAB — URINALYSIS, ROUTINE W REFLEX MICROSCOPIC
Bilirubin Urine: NEGATIVE
Glucose, UA: NEGATIVE mg/dL
Hgb urine dipstick: NEGATIVE
Ketones, ur: 15 mg/dL — AB
Leukocytes, UA: NEGATIVE
Nitrite: NEGATIVE
Protein, ur: NEGATIVE mg/dL
Specific Gravity, Urine: 1.004 — ABNORMAL LOW (ref 1.005–1.030)
Urobilinogen, UA: 0.2 mg/dL (ref 0.0–1.0)
pH: 7 (ref 5.0–8.0)

## 2014-08-26 LAB — CBC
HEMATOCRIT: 42.1 % (ref 39.0–52.0)
HEMOGLOBIN: 15 g/dL (ref 13.0–17.0)
MCH: 33.3 pg (ref 26.0–34.0)
MCHC: 35.6 g/dL (ref 30.0–36.0)
MCV: 93.6 fL (ref 78.0–100.0)
Platelets: 207 10*3/uL (ref 150–400)
RBC: 4.5 MIL/uL (ref 4.22–5.81)
RDW: 11.6 % (ref 11.5–15.5)
WBC: 8.6 10*3/uL (ref 4.0–10.5)

## 2014-08-26 LAB — LIPASE, BLOOD: Lipase: 47 U/L (ref 11–59)

## 2014-08-26 MED ORDER — SODIUM CHLORIDE 0.9 % IV BOLUS (SEPSIS)
1000.0000 mL | Freq: Once | INTRAVENOUS | Status: AC
  Administered 2014-08-26: 1000 mL via INTRAVENOUS

## 2014-08-26 MED ORDER — LOPERAMIDE HCL 2 MG PO CAPS
4.0000 mg | ORAL_CAPSULE | Freq: Once | ORAL | Status: AC
Start: 2014-08-26 — End: 2014-08-26
  Administered 2014-08-26: 4 mg via ORAL
  Filled 2014-08-26: qty 2

## 2014-08-26 MED ORDER — LOPERAMIDE HCL 2 MG PO CAPS: 4.0000 mg | ORAL_CAPSULE | Freq: Four times a day (QID) | ORAL | Status: DC | PRN

## 2014-08-26 MED ORDER — CLONIDINE HCL 0.1 MG PO TABS: ORAL_TABLET | ORAL | Status: DC

## 2014-08-26 MED ORDER — PROMETHAZINE HCL 25 MG/ML IJ SOLN
25.0000 mg | Freq: Once | INTRAMUSCULAR | Status: AC
  Administered 2014-08-26: 25 mg via INTRAVENOUS
  Filled 2014-08-26: qty 1

## 2014-08-26 MED ORDER — PROMETHAZINE HCL 25 MG PO TABS: 25.0000 mg | ORAL_TABLET | Freq: Three times a day (TID) | ORAL | Status: DC | PRN

## 2014-08-26 MED ORDER — ONDANSETRON HCL 4 MG/2ML IJ SOLN
4.0000 mg | Freq: Once | INTRAMUSCULAR | Status: AC
  Administered 2014-08-26: 4 mg via INTRAVENOUS
  Filled 2014-08-26: qty 2

## 2014-08-26 NOTE — ED Notes (Signed)
Pt here for nausea and vomiting and diarrhea, pt has been coming off OxyContin, pt has not had a pill in 4 days , pt feels like he is dehydrated and also c/o abd pain

## 2014-08-26 NOTE — Discharge Instructions (Signed)
Return here as needed.  Follow-up with a primary care doctor start with a clear liquid diet and then progressed, her way up to things such as rice, toast, applesauce, and bananas

## 2014-08-26 NOTE — ED Provider Notes (Signed)
CSN: [id number]     Arrival date & time 08/26/14  1046 History   First MD Initiated Contact with Patient 08/26/14 1150     Chief Complaint  Patient presents with  . Nausea  . Diarrhea     (Consider location/radiation/quality/duration/timing/severity/associated sxs/prior Treatment) HPI Patient presents to the emergency department with withdrawal symptoms from OxyContin.  The patient states that he stopped cold [place] taking OxyContin tablets due to the fact that he has a job that we will be giving him a drug test and he states that he did not want to failed the test because he does not take the pills by prescription.  The patient denies chest pain, shortness breath, weakness, dizziness, headache, blurred vision, back pain, neck pain, dysuria, lightheadedness, numbness or hallucinations.  Patient states that he has been taking the clonidine at home along with Zofran Past Medical History  Diagnosis Date  . Hypertension   . Hyperlipidemia   . Gastric ulcer   . Hepatitis C    Past Surgical History  Procedure Laterality Date  . Stomach surgery     Family History  Problem Relation Age of Onset  . Diabetes Father   . Hypertension Father   . Diabetes Sister   . Hypertension Sister   . Diabetes Brother   . Hypertension Brother    History  Substance Use Topics  . Smoking status: Former Games developermoker  . Smokeless tobacco: Not on file  . Alcohol Use: Yes    Review of Systems  All other systems negative except as documented in the HPI. All pertinent positives and negatives as reviewed in the HPI.  Allergies  Ibuprofen and Other  Home Medications   Prior to Admission medications   Medication Sig Start Date End Date Taking? Authorizing Provider  cloNIDine (CATAPRES) 0.1 MG tablet Take 1 tablet (0.1 mg total) by mouth every 6 (six) hours as needed (withdrawl symptoms). 08/23/14   Azalia BilisKevin Campos, MD  lisinopril (PRINIVIL,ZESTRIL) 5 MG tablet Take 1 tablet (5 mg total) by mouth daily. 09/20/13    Rodolph BongEvan S Corey, MD  Multiple Vitamins-Minerals (MENS MULTI VITAMIN & MINERAL PO) Take 1 tablet by mouth daily.    Historical Provider, MD  ondansetron (ZOFRAN ODT) 8 MG disintegrating tablet Take 1 tablet (8 mg total) by mouth every 8 (eight) hours as needed for nausea or vomiting. 08/23/14   Azalia BilisKevin Campos, MD   BP 148/92 mmHg  Pulse 62  Temp(Src) 97.4 F (36.3 C) (Oral)  Resp 18  Ht 5\' 10"  (1.778 m)  Wt 165 lb (74.844 kg)  BMI 23.68 kg/m2  SpO2 100% Physical Exam  Constitutional: He is oriented to person, place, and time. He appears well-developed and well-nourished. No distress.  HENT:  Head: Normocephalic and atraumatic.  Mouth/Throat: Oropharynx is clear and moist.  Eyes: Pupils are equal, round, and reactive to light.  Neck: Normal range of motion. Neck supple.  Cardiovascular: Normal rate, regular rhythm and normal heart sounds.  Exam reveals no gallop and no friction rub.   No murmur heard. Pulmonary/Chest: Effort normal and breath sounds normal. No respiratory distress. He has no wheezes.  Abdominal: Soft. Normal appearance and bowel sounds are normal. He exhibits no distension. There is no rigidity, no rebound, no guarding and no CVA tenderness. No hernia.    Musculoskeletal: He exhibits no edema.  Neurological: He is alert and oriented to person, place, and time. He exhibits normal muscle tone. Coordination normal.  Nursing note and vitals reviewed.   ED Course  Procedures (including critical care time) Labs Review Labs Reviewed  BASIC METABOLIC PANEL - Abnormal; Notable for the following:    Glucose, Bld 137 (*)    BUN <5 (*)    All other components within normal limits  URINALYSIS, ROUTINE W REFLEX MICROSCOPIC - Abnormal; Notable for the following:    Specific Gravity, Urine 1.004 (*)    Ketones, ur 15 (*)    All other components within normal limits  CBC  LIPASE, BLOOD    Patient will be treated for viral symptoms.  Patient is advised return here as needed.   Told to slowly increase his fluid intake, rest as much as possible   Charlestine Night, PA-C 08/26/14 1427  Pricilla Loveless, MD 08/27/14 [phone]
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
## 2014-09-02 ENCOUNTER — Ambulatory Visit: Payer: Self-pay | Attending: Internal Medicine | Admitting: Internal Medicine

## 2014-09-02 ENCOUNTER — Encounter: Payer: Self-pay | Admitting: Internal Medicine

## 2014-09-02 VITALS — BP 130/86 | HR 72 | Resp 16 | Wt 173.0 lb

## 2014-09-02 DIAGNOSIS — I10 Essential (primary) hypertension: Secondary | ICD-10-CM | POA: Insufficient documentation

## 2014-09-02 DIAGNOSIS — F1911 Other psychoactive substance abuse, in remission: Secondary | ICD-10-CM

## 2014-09-02 DIAGNOSIS — R1013 Epigastric pain: Secondary | ICD-10-CM | POA: Insufficient documentation

## 2014-09-02 DIAGNOSIS — Z8659 Personal history of other mental and behavioral disorders: Secondary | ICD-10-CM | POA: Insufficient documentation

## 2014-09-02 DIAGNOSIS — Z720 Tobacco use: Secondary | ICD-10-CM

## 2014-09-02 DIAGNOSIS — B182 Chronic viral hepatitis C: Secondary | ICD-10-CM | POA: Insufficient documentation

## 2014-09-02 DIAGNOSIS — F17210 Nicotine dependence, cigarettes, uncomplicated: Secondary | ICD-10-CM | POA: Insufficient documentation

## 2014-09-02 DIAGNOSIS — Z87898 Personal history of other specified conditions: Secondary | ICD-10-CM

## 2014-09-02 DIAGNOSIS — Z139 Encounter for screening, unspecified: Secondary | ICD-10-CM

## 2014-09-02 DIAGNOSIS — F17200 Nicotine dependence, unspecified, uncomplicated: Secondary | ICD-10-CM

## 2014-09-02 LAB — COMPLETE METABOLIC PANEL WITH GFR
ALT: 35 U/L (ref 0–53)
AST: 22 U/L (ref 0–37)
Albumin: 4.1 g/dL (ref 3.5–5.2)
Alkaline Phosphatase: 70 U/L (ref 39–117)
BILIRUBIN TOTAL: 0.4 mg/dL (ref 0.2–1.2)
BUN: 7 mg/dL (ref 6–23)
CO2: 25 meq/L (ref 19–32)
Calcium: 9.2 mg/dL (ref 8.4–10.5)
Chloride: 104 mEq/L (ref 96–112)
Creat: 0.9 mg/dL (ref 0.50–1.35)
GFR, Est African American: >89 mL/min
GFR, Est Non African American: >89 mL/min
Glucose, Bld: 163 mg/dL — ABNORMAL HIGH (ref 70–99)
POTASSIUM: 4.7 meq/L (ref 3.5–5.3)
SODIUM: 140 meq/L (ref 135–145)
Total Protein: 7.2 g/dL (ref 6.0–8.3)

## 2014-09-02 LAB — HEMOGLOBIN A1C
Hgb A1c MFr Bld: 6.1 % — ABNORMAL HIGH (ref <5.7)
Mean Plasma Glucose: 128 mg/dL — ABNORMAL HIGH (ref <117)

## 2014-09-02 LAB — TSH: TSH: 0.768 u[IU]/mL (ref 0.350–4.500)

## 2014-09-02 MED ORDER — OMEPRAZOLE 20 MG PO CPDR: 20.0000 mg | DELAYED_RELEASE_CAPSULE | Freq: Every day | ORAL | Status: DC

## 2014-09-02 MED ORDER — LISINOPRIL 5 MG PO TABS: 5.0000 mg | ORAL_TABLET | Freq: Every day | ORAL | Status: DC

## 2014-09-02 NOTE — Progress Notes (Signed)
Patient here to establish care Was recently treated for withdrawal from oxycodone Patient had been taking for almost three years and decided to get clean Patient has been off pain killers for eleven days

## 2014-09-02 NOTE — Progress Notes (Signed)
Patient Demographics  Chad Wolfe, is a 49 y.o. male  ZOX:[id number]  [id number]  DOB - 1965-09-04  CC:  Chief Complaint  Patient presents with  . Establish Care       HPI: Chad Wolfe is a 49 y.o. male here today to establish medical care.patient has history of hypertension, chronic hep C, history of substance abuse, recently went to the emergency room with symptoms of opioid withdrawal, as per patient for the last 11 days he has not used any appointment medications and is improving on the symptoms he also took clonidine to help with the withdrawal symptoms currently his bowel movements are improved denies any nausea vomiting but does complaints of dyspepsia and reflux symptoms, as per patient he took Pepto-Bismol which helped him with the symptoms.patient also has history of hep C and never been treated, currently patient occasionally drink alcohol and smoke cigarettes. Have counseled patient to quit smoking. Patient has No headache, No chest pain, No abdominal pain - No Nausea, No new weakness tingling or numbness, No Cough - SOB.  Allergies  Allergen Reactions  . Ibuprofen     REACTION: stomach upset  . Other Other (See Comments)    MD told him not to take [facility]   Past Medical History  Diagnosis Date  . Hypertension   . Hyperlipidemia   . Gastric ulcer   . Hepatitis C   . Substance abuse    Current Outpatient Prescriptions on File Prior to Visit  Medication Sig Dispense Refill  . cloNIDine (CATAPRES) 0.1 MG tablet 0.05mg  QID for 2 days then 0.025mg  QID for 2 days 8 tablet 0  . loperamide (IMODIUM) 2 MG capsule Take 2 capsules (4 mg total) by mouth 4 (four) times daily as needed for diarrhea or loose stools. 12 capsule 0  . Multiple Vitamins-Minerals (MENS MULTI VITAMIN & MINERAL PO) Take 1 tablet by mouth daily.    . ondansetron (ZOFRAN ODT) 8 MG disintegrating tablet Take 1 tablet (8 mg total) by mouth every 8 (eight) hours as needed for nausea or vomiting.  12 tablet 0  . promethazine (PHENERGAN) 25 MG tablet Take 1 tablet (25 mg total) by mouth every 8 (eight) hours as needed for nausea or vomiting. 15 tablet 0   No current facility-administered medications on file prior to visit.   Family History  Problem Relation Age of Onset  . Diabetes Father   . Hypertension Father   . Diabetes Sister   . Hypertension Sister   . Diabetes Brother   . Hypertension Brother    History   Social History  . Marital Status: Married    Spouse Name: N/A  . Number of Children: N/A  . Years of Education: N/A   Occupational History  . Not on file.   Social History Main Topics  . Smoking status: Current Some Day Smoker  . Smokeless tobacco: Not on file  . Alcohol Use: No  . Drug Use: No  . Sexual Activity: Not on file     Comment: opiates   Other Topics Concern  . Not on file   Social History Narrative    Review of Systems: Constitutional: Negative for fever, chills, diaphoresis, activity change, appetite change and fatigue. HENT: Negative for ear pain, nosebleeds, congestion, facial swelling, rhinorrhea, neck pain, neck stiffness and ear discharge.  Eyes: Negative for pain, discharge, redness, itching and visual disturbance. Respiratory: Negative for cough, choking, chest tightness, shortness of breath, wheezing and stridor.  Cardiovascular: Negative for  chest pain, palpitations and leg swelling. Gastrointestinal: Negative for abdominal distention. Genitourinary: Negative for dysuria, urgency, frequency, hematuria, flank pain, decreased urine volume, difficulty urinating and dyspareunia.  Musculoskeletal: Negative for back pain, joint swelling, arthralgia and gait problem. Neurological: Negative for dizziness, tremors, seizures, syncope, facial asymmetry, speech difficulty, weakness, light-headedness, numbness and headaches.  Hematological: Negative for adenopathy. Does not bruise/bleed easily. Psychiatric/Behavioral: Negative for  hallucinations, behavioral problems, confusion, dysphoric mood, decreased concentration and agitation.    Objective:   Filed Vitals:   09/02/14 1002  BP: 130/86  Pulse: 72  Resp: 16    Physical Exam: Constitutional: Patient appears well-developed and well-nourished. No distress. HENT: Normocephalic, atraumatic, External right and left ear normal. Oropharynx is clear and moist.  Eyes: Conjunctivae and EOM are normal. PERRLA, no scleral icterus. Neck: Normal ROM. Neck supple. No JVD. No tracheal deviation. No thyromegaly. CVS: RRR, S1/S2 +, no murmurs, no gallops, no carotid bruit.  Pulmonary: Effort and breath sounds normal, no stridor, rhonchi, wheezes, rales.  Abdominal: Soft. BS +, no distension, tenderness, rebound or guarding.  Musculoskeletal: Normal range of motion. No edema and no tenderness.  Neuro: Alert. Normal reflexes, muscle tone coordination. No cranial nerve deficit. Skin: Skin is warm and dry. No rash noted. Not diaphoretic. No erythema. No pallor. Psychiatric: Normal mood and affect. Behavior, judgment, thought content normal.  Lab Results  Component Value Date   WBC 8.6 08/26/2014   HGB 15.0 08/26/2014   HCT 42.1 08/26/2014   MCV 93.6 08/26/2014   PLT 207 08/26/2014   Lab Results  Component Value Date   CREATININE 0.99 08/26/2014   BUN <5* 08/26/2014   NA 138 08/26/2014   K 3.5 08/26/2014   CL 105 08/26/2014   CO2 26 08/26/2014    Lab Results  Component Value Date   HGBA1C 6.0* 03/16/2010   Lipid Panel     Component Value Date/Time   CHOL 212* 12/14/2007 2130   TRIG 68 12/14/2007 2130   HDL 45 12/14/2007 2130   CHOLHDL 4.7 Ratio 12/14/2007 2130   VLDL 14 12/14/2007 2130   LDLCALC 153* 12/14/2007 2130       Assessment and plan:   1. History of substance abuse Several years ago patient had used cocaine history of opioid abuse, has been clean for the last several days.  2. Dyspepsia Advise patient for lifestyle modification, trial of  Prilosec. - omeprazole (PRILOSEC) 20 MG capsule; Take 1 capsule (20 mg total) by mouth daily.  Dispense: 30 capsule; Refill: 3  3. Essential hypertension Advised patient for DASH diet continue with lisinopril 5 mg daily. - lisinopril (PRINIVIL,ZESTRIL) 5 MG tablet; Take 1 tablet (5 mg total) by mouth daily.  Dispense: 30 tablet; Refill: 3 - COMPLETE METABOLIC PANEL WITH GFR  4. Chronic hepatitis C without hepatic coma  - Ambulatory referral to Infectious Disease - Hepatitis C RNA quantitative  5. Smoking Consultation to quit smoking.  6. Screening Ordered baseline blood work. - COMPLETE METABOLIC PANEL WITH GFR - TSH - Vit D  25 hydroxy (rtn osteoporosis monitoring) - Hemoglobin A1c  Return in about 3 months (around 12/03/2014), or if symptoms worsen or fail to improve, for hypertension.    The patient was given clear instructions to go to ER or return to medical center if symptoms don't improve, worsen or new problems develop. The patient verbalized understanding. The patient was told to call to get lab results if they haven't heard anything in the next week.    This note  has been created with Education officer, environmentalDragon speech recognition software and smart phrase technology. Any transcriptional errors are unintentional.   Doris CheadleADVANI, Eldar Robitaille, MD

## 2014-09-02 NOTE — Patient Instructions (Signed)
Smoking Cessation Quitting smoking is important to your health and has many advantages. However, it is not always easy to quit since nicotine is a very addictive drug. Oftentimes, people try 3 times or more before being able to quit. This document explains the best ways for you to prepare to quit smoking. Quitting takes hard work and a lot of effort, but you can do it. ADVANTAGES OF QUITTING SMOKING  You will live longer, feel better, and live better.  Your body will feel the impact of quitting smoking almost immediately.  Within 20 minutes, blood pressure decreases. Your pulse returns to its normal level.  After 8 hours, carbon monoxide levels in the blood return to normal. Your oxygen level increases.  After 24 hours, the chance of having a heart attack starts to decrease. Your breath, hair, and body stop smelling like smoke.  After 48 hours, damaged nerve endings begin to recover. Your sense of taste and smell improve.  After 72 hours, the body is virtually free of nicotine. Your bronchial tubes relax and breathing becomes easier.  After 2 to 12 weeks, lungs can hold more air. Exercise becomes easier and circulation improves.  The risk of having a heart attack, stroke, cancer, or lung disease is greatly reduced.  After 1 year, the risk of coronary heart disease is cut in half.  After 5 years, the risk of stroke falls to the same as a nonsmoker.  After 10 years, the risk of lung cancer is cut in half and the risk of other cancers decreases significantly.  After 15 years, the risk of coronary heart disease drops, usually to the level of a nonsmoker.  If you are pregnant, quitting smoking will improve your chances of having a healthy baby.  The people you live with, especially any children, will be healthier.  You will have extra money to spend on things other than cigarettes. QUESTIONS TO THINK ABOUT BEFORE ATTEMPTING TO QUIT You may want to talk about your answers with your  health care provider.  Why do you want to quit?  If you tried to quit in the past, what helped and what did not?  What will be the most difficult situations for you after you quit? How will you plan to handle them?  Who can help you through the tough times? Your family? Friends? A health care provider?  What pleasures do you get from smoking? What ways can you still get pleasure if you quit? Here are some questions to ask your health care provider:  How can you help me to be successful at quitting?  What medicine do you think would be best for me and how should I take it?  What should I do if I need more help?  What is smoking withdrawal like? How can I get information on withdrawal? GET READY  Set a quit date.  Change your environment by getting rid of all cigarettes, ashtrays, matches, and lighters in your home, car, or work. Do not let people smoke in your home.  Review your past attempts to quit. Think about what worked and what did not. GET SUPPORT AND ENCOURAGEMENT You have a better chance of being successful if you have help. You can get support in many ways.  Tell your family, friends, and coworkers that you are going to quit and need their support. Ask them not to smoke around you.  Get individual, group, or telephone counseling and support. Programs are available at local hospitals and health centers. Call   your local health department for information about programs in your area.  Spiritual beliefs and practices may help some smokers quit.  Download a "quit meter" on your computer to keep track of quit statistics, such as how long you have gone without smoking, cigarettes not smoked, and money saved.  Get a self-help book about quitting smoking and staying off tobacco. LEARN NEW SKILLS AND BEHAVIORS  Distract yourself from urges to smoke. Talk to someone, go for a walk, or occupy your time with a task.  Change your normal routine. Take a different route to work.  Drink tea instead of coffee. Eat breakfast in a different place.  Reduce your stress. Take a hot bath, exercise, or read a book.  Plan something enjoyable to do every day. Reward yourself for not smoking.  Explore interactive web-based programs that specialize in helping you quit. GET MEDICINE AND USE IT CORRECTLY Medicines can help you stop smoking and decrease the urge to smoke. Combining medicine with the above behavioral methods and support can greatly increase your chances of successfully quitting smoking.  Nicotine replacement therapy helps deliver nicotine to your body without the negative effects and risks of smoking. Nicotine replacement therapy includes nicotine gum, lozenges, inhalers, nasal sprays, and skin patches. Some may be available over-the-counter and others require a prescription.  Antidepressant medicine helps people abstain from smoking, but how this works is unknown. This medicine is available by prescription.  Nicotinic receptor partial agonist medicine simulates the effect of nicotine in your brain. This medicine is available by prescription. Ask your health care provider for advice about which medicines to use and how to use them based on your health history. Your health care provider will tell you what side effects to look out for if you choose to be on a medicine or therapy. Carefully read the information on the package. Do not use any other product containing nicotine while using a nicotine replacement product.  RELAPSE OR DIFFICULT SITUATIONS Most relapses occur within the first 3 months after quitting. Do not be discouraged if you start smoking again. Remember, most people try several times before finally quitting. You may have symptoms of withdrawal because your body is used to nicotine. You may crave cigarettes, be irritable, feel very hungry, cough often, get headaches, or have difficulty concentrating. The withdrawal symptoms are only temporary. They are strongest  when you first quit, but they will go away within 10-14 days. To reduce the chances of relapse, try to:  Avoid drinking alcohol. Drinking lowers your chances of successfully quitting.  Reduce the amount of caffeine you consume. Once you quit smoking, the amount of caffeine in your body increases and can give you symptoms, such as a rapid heartbeat, sweating, and anxiety.  Avoid smokers because they can make you want to smoke.  Do not let weight gain distract you. Many smokers will gain weight when they quit, usually less than 10 pounds. Eat a healthy diet and stay active. You can always lose the weight gained after you quit.  Find ways to improve your mood other than smoking. FOR MORE INFORMATION  www.smokefree.gov  Document Released: 05/18/2001 Document Revised: 10/08/2013 Document Reviewed: 09/02/2011 ExitCare Patient Information 2015 [employer]. This information is not intended to replace advice given to you by your health care provider. Make sure you discuss any questions you have with your health care provider. DASH Eating Plan DASH stands for "Dietary Approaches to Stop Hypertension." The DASH eating plan is a healthy eating plan that has   been shown to reduce high blood pressure (hypertension). Additional health benefits may include reducing the risk of type 2 diabetes mellitus, heart disease, and stroke. The DASH eating plan may also help with weight loss. WHAT DO I NEED TO KNOW ABOUT THE DASH EATING PLAN? For the DASH eating plan, you will follow these general guidelines:  Choose foods with a percent daily value for sodium of less than 5% (as listed on the food label).  Use salt-free seasonings or herbs instead of table salt or sea salt.  Check with your health care provider or pharmacist before using salt substitutes.  Eat lower-sodium products, often labeled as "lower sodium" or "no salt added."  Eat fresh foods.  Eat more vegetables, fruits, and low-fat dairy  products.  Choose whole grains. Look for the word "whole" as the first word in the ingredient list.  Choose fish and skinless chicken or turkey more often than red meat. Limit fish, poultry, and meat to 6 oz (170 g) each day.  Limit sweets, desserts, sugars, and sugary drinks.  Choose heart-healthy fats.  Limit cheese to 1 oz (28 g) per day.  Eat more home-cooked food and less restaurant, buffet, and fast food.  Limit fried foods.  Cook foods using methods other than frying.  Limit canned vegetables. If you do use them, rinse them well to decrease the sodium.  When eating at a restaurant, ask that your food be prepared with less salt, or no salt if possible. WHAT FOODS CAN I EAT? Seek help from a dietitian for individual calorie needs. Grains Whole grain or whole wheat bread. Brown rice. Whole grain or whole wheat pasta. Quinoa, bulgur, and whole grain cereals. Low-sodium cereals. Corn or whole wheat flour tortillas. Whole grain cornbread. Whole grain crackers. Low-sodium crackers. Vegetables Fresh or frozen vegetables (raw, steamed, roasted, or grilled). Low-sodium or reduced-sodium tomato and vegetable juices. Low-sodium or reduced-sodium tomato sauce and paste. Low-sodium or reduced-sodium canned vegetables.  Fruits All fresh, canned (in natural juice), or frozen fruits. Meat and Other Protein Products Ground beef (85% or leaner), grass-fed beef, or beef trimmed of fat. Skinless chicken or turkey. Ground chicken or turkey. Pork trimmed of fat. All fish and seafood. Eggs. Dried beans, peas, or lentils. Unsalted nuts and seeds. Unsalted canned beans. Dairy Low-fat dairy products, such as skim or 1% milk, 2% or reduced-fat cheeses, low-fat ricotta or cottage cheese, or plain low-fat yogurt. Low-sodium or reduced-sodium cheeses. Fats and Oils Tub margarines without trans fats. Light or reduced-fat mayonnaise and salad dressings (reduced sodium). Avocado. Safflower, olive, or canola  oils. Natural peanut or almond butter. Other Unsalted popcorn and pretzels. The items listed above may not be a complete list of recommended foods or beverages. Contact your dietitian for more options. WHAT FOODS ARE NOT RECOMMENDED? Grains White bread. White pasta. White rice. Refined cornbread. Bagels and croissants. Crackers that contain trans fat. Vegetables Creamed or fried vegetables. Vegetables in a cheese sauce. Regular canned vegetables. Regular canned tomato sauce and paste. Regular tomato and vegetable juices. Fruits Dried fruits. Canned fruit in light or heavy syrup. Fruit juice. Meat and Other Protein Products Fatty cuts of meat. Ribs, chicken wings, bacon, sausage, bologna, salami, chitterlings, fatback, hot dogs, bratwurst, and packaged luncheon meats. Salted nuts and seeds. Canned beans with salt. Dairy Whole or 2% milk, cream, half-and-half, and cream cheese. Whole-fat or sweetened yogurt. Full-fat cheeses or blue cheese. Nondairy creamers and whipped toppings. Processed cheese, cheese spreads, or cheese curds. Condiments Onion and garlic salt,   seasoned salt, table salt, and sea salt. Canned and packaged gravies. Worcestershire sauce. Tartar sauce. Barbecue sauce. Teriyaki sauce. Soy sauce, including reduced sodium. Steak sauce. Fish sauce. Oyster sauce. Cocktail sauce. Horseradish. Ketchup and mustard. Meat flavorings and tenderizers. Bouillon cubes. Hot sauce. Tabasco sauce. Marinades. Taco seasonings. Relishes. Fats and Oils Butter, stick margarine, lard, shortening, ghee, and bacon fat. Coconut, palm kernel, or palm oils. Regular salad dressings. Other Pickles and olives. Salted popcorn and pretzels. The items listed above may not be a complete list of foods and beverages to avoid. Contact your dietitian for more information. WHERE CAN I FIND MORE INFORMATION? National Heart, Lung, and Blood Institute: www.nhlbi.nih.gov/health/health-topics/topics/dash/ Document Released:  05/13/2011 Document Revised: 10/08/2013 Document Reviewed: 03/28/2013 ExitCare Patient Information 2015 [employer]. This information is not intended to replace advice given to you by your health care provider. Make sure you discuss any questions you have with your health care provider.  

## 2014-09-03 LAB — HEPATITIS C RNA QUANTITATIVE
HCV QUANT: 594851 [IU]/mL — AB (ref <15)
HCV Quantitative Log: 5.77 {Log} — ABNORMAL HIGH (ref <1.18)

## 2014-09-03 LAB — VITAMIN D 25 HYDROXY (VIT D DEFICIENCY, FRACTURES): Vit D, 25-Hydroxy: 19 ng/mL — ABNORMAL LOW (ref 30–100)

## 2014-09-13 ENCOUNTER — Telehealth: Payer: Self-pay

## 2014-09-13 MED ORDER — VITAMIN D (ERGOCALCIFEROL) 1.25 MG (50000 UNIT) PO CAPS: 50000.0000 [IU] | ORAL_CAPSULE | ORAL | Status: DC

## 2014-09-13 NOTE — Telephone Encounter (Signed)
Patient not available Left message on voice mail to return our call 

## 2014-09-13 NOTE — Telephone Encounter (Signed)
-----   Message from Doris Cheadleeepak Advani, MD sent at 09/04/2014 12:59 PM EDT ----- Blood work reviewed noticed elevated hep C viral load, patient has already been referred to infectious disease.help patient to schedule appointment with ID.  noticed low vitamin D, call patient advise to start ergocalciferol 50,000 units once a week for the duration of  12 weeks.  noticed hemoglobin A1c of 6.1%, patient has prediabetes, call and advise patient for low carbohydrate diet.

## 2014-09-18 ENCOUNTER — Telehealth: Payer: Self-pay

## 2014-09-18 ENCOUNTER — Telehealth: Payer: Self-pay | Admitting: Internal Medicine

## 2014-09-18 NOTE — Telephone Encounter (Signed)
Patient returned phone call and is aware of his lab results  

## 2014-09-18 NOTE — Telephone Encounter (Signed)
Patient called is returning nurse's phone call to review results. Please f/u with pt °

## 2014-09-25 ENCOUNTER — Telehealth: Payer: Self-pay | Admitting: Internal Medicine

## 2014-09-25 NOTE — Telephone Encounter (Signed)
Patient called stating that his stomach has been bothering him and he believes that it is due to the medications he is taking. Please f/u

## 2014-09-26 ENCOUNTER — Ambulatory Visit: Payer: Self-pay | Attending: Internal Medicine | Admitting: Physician Assistant

## 2014-09-26 ENCOUNTER — Encounter: Payer: Self-pay | Admitting: Physician Assistant

## 2014-09-26 VITALS — BP 136/85 | HR 106 | Temp 98.2°F | Resp 18 | Ht 70.0 in | Wt 178.0 lb

## 2014-09-26 DIAGNOSIS — R1013 Epigastric pain: Secondary | ICD-10-CM

## 2014-09-26 DIAGNOSIS — G8929 Other chronic pain: Secondary | ICD-10-CM

## 2014-09-26 MED ORDER — OMEPRAZOLE 20 MG PO CPDR: 20.0000 mg | DELAYED_RELEASE_CAPSULE | Freq: Two times a day (BID) | ORAL | Status: DC

## 2014-09-26 MED ORDER — CYCLOBENZAPRINE HCL 5 MG PO TABS: 5.0000 mg | ORAL_TABLET | Freq: Three times a day (TID) | ORAL | Status: DC | PRN

## 2014-09-26 NOTE — Progress Notes (Signed)
Patient having upper stomach pain, described as sore at level 6. Patient has history of stomach ulcers back in 1989. 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 Quit using percocet sometime around August 04, 2014. Had a knot, the size of a baseball, in upper stomach when coming off of percocet. Has had multiple hospital admissions for this problem. Patient has been taking omeprazole prescribed by Dr. Orpah CobbAdvani. Patient drinks Red Bull energy drinks and takes Ibuprofen. Pain leaves when patient eats food.

## 2014-09-26 NOTE — Patient Instructions (Signed)
Increase Omeprazole to twice daily Try Flexeril 3 X daily I have sent a message to our referral coordinator to assist in scheduling the ID appt 2 week follow up with Dr. Orpah CobbAdvani Avoid Tylenol and Ibuprofren

## 2014-09-26 NOTE — Progress Notes (Signed)
Chief Complaint: Abdominal pain for 5 days  Subjective:  this is a 49 year old male with chronic hepatitis C. 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 He's been off opioids since March 27. He did go through withdrawal and a lot of his symptoms are related to epigastric abdominal pain. He felt good for several weeks and then for the last 4-5 days his symptoms have reoccurred. He feels like his stomach is in knots. He sore in his belly. He has been taking omeprazole with temporary relief. He also has tried some over-the-counter Gaviscon. His appetite is decreased. He's been taking a lot of energy drinks during the day to stay awake because of the lack of sleep at night. He is taking intermittent ibuprofen which she has been told not to take in the past secondary to history of bleeding ulcers. He denies nausea or vomiting. He's having regular bowel movements.  ROS:  GEN: denies fever or chills, denies change in weight ABD: denies abd pain, N or V EXT: denies muscle spasms or swelling; no pain in lower ext, no weakness   Objective:  Filed Vitals:   09/26/14 1434  BP: 136/85  Pulse: 106  Temp: 98.2 F (36.8 C)  TempSrc: Oral  Resp: 18  Height: 5\' 10"  (1.778 m)  Weight: 178 lb (80.74 kg)  SpO2: 96%    Physical Exam:  General: in no acute distress. Abdomen: Soft, nontender, nondistended, positive bowel sounds. Extremities: No clubbing cyanosis or edema with positive pedal pulses.   Medications: Prior to Admission medications   Medication Sig Start Date End Date Taking? Authorizing Provider  cloNIDine (CATAPRES) 0.1 MG tablet 0.05mg  QID for 2 days then 0.025mg  QID for 2 days 08/26/14   Charlestine Nighthristopher Lawyer, PA-C  cloNIDine (CATAPRES) 0.1 MG tablet Take 0.1 mg by mouth every 6 (six) hours as needed (withdrawl).    Historical Provider, MD  cyclobenzaprine (FLEXERIL) 5 MG tablet Take 1 tablet (5 mg total) by mouth 3 (three) times daily as needed for muscle spasms. 09/26/14   Garnett Nunziata Netta CedarsS  Kayde Atkerson, PA-C  lisinopril (PRINIVIL,ZESTRIL) 5 MG tablet Take 1 tablet (5 mg total) by mouth daily. 09/02/14   Doris Cheadleeepak Advani, MD  loperamide (IMODIUM) 2 MG capsule Take 2 capsules (4 mg total) by mouth 4 (four) times daily as needed for diarrhea or loose stools. 08/26/14   Charlestine Nighthristopher Lawyer, PA-C  Multiple Vitamins-Minerals (MENS MULTI VITAMIN & MINERAL PO) Take 1 tablet by mouth daily.    Historical Provider, MD  omeprazole (PRILOSEC) 20 MG capsule Take 1 capsule (20 mg total) by mouth 2 (two) times daily before a meal. 09/26/14   Vivianne Masteriffany S Daeton Kluth, PA-C  ondansetron (ZOFRAN ODT) 8 MG disintegrating tablet Take 1 tablet (8 mg total) by mouth every 8 (eight) hours as needed for nausea or vomiting. 08/23/14   Azalia BilisKevin Campos, MD  promethazine (PHENERGAN) 25 MG tablet Take 1 tablet (25 mg total) by mouth every 8 (eight) hours as needed for nausea or vomiting. 08/26/14   Charlestine Nighthristopher Lawyer, PA-C  Vitamin D, Ergocalciferol, (DRISDOL) 50000 UNITS CAPS capsule Take 1 capsule (50,000 Units total) by mouth every 7 (seven) days. 09/13/14   Doris Cheadleeepak Advani, MD    Assessment: 1. Abdominal Pain-epigastric; acute on chronic 2. Sinus tachycardia 3. Smoker 4.  Recent opiod cessation/withdrawal 5. Chronic Hep C  Plan: Increase Omeprazole twice daily Flexeril TID prn ID referral placed (I sent Arna Mediciora a message) Avoid Ibuprofren with hx ulcers; avoid Tylenol with Chr Hep C Avoid energy drinks  Follow up:2 weeks with dr. Orpah Cobb  The patient was given clear instructions to go to ER or return to medical center if symptoms don't improve, worsen or new problems develop. The patient verbalized understanding. The patient was told to call to get lab results if they haven't heard anything in the next week.   This note has been created with Education officer, environmental. Any transcriptional errors are unintentional.   Scot Jun, PA-C 09/26/2014, 3:09 PM

## 2014-09-27 ENCOUNTER — Telehealth: Payer: Self-pay

## 2014-09-27 NOTE — Telephone Encounter (Signed)
Spoke with patient Patient was seen yesterday in the walk in clinic Medications were increased and patient stated he is feeling much better

## 2014-10-01 ENCOUNTER — Ambulatory Visit: Payer: Self-pay | Attending: Internal Medicine

## 2014-10-14 ENCOUNTER — Ambulatory Visit: Payer: Self-pay | Attending: Internal Medicine | Admitting: Internal Medicine

## 2014-10-14 ENCOUNTER — Encounter: Payer: Self-pay | Admitting: Internal Medicine

## 2014-10-14 VITALS — BP 126/84 | HR 92 | Temp 98.6°F | Resp 16 | Wt 185.0 lb

## 2014-10-14 DIAGNOSIS — E559 Vitamin D deficiency, unspecified: Secondary | ICD-10-CM | POA: Insufficient documentation

## 2014-10-14 DIAGNOSIS — R7303 Prediabetes: Secondary | ICD-10-CM

## 2014-10-14 DIAGNOSIS — R1013 Epigastric pain: Secondary | ICD-10-CM | POA: Insufficient documentation

## 2014-10-14 DIAGNOSIS — R7309 Other abnormal glucose: Secondary | ICD-10-CM | POA: Insufficient documentation

## 2014-10-14 DIAGNOSIS — B182 Chronic viral hepatitis C: Secondary | ICD-10-CM | POA: Insufficient documentation

## 2014-10-14 DIAGNOSIS — I10 Essential (primary) hypertension: Secondary | ICD-10-CM | POA: Insufficient documentation

## 2014-10-14 NOTE — Progress Notes (Signed)
Patient here for follow up on his acid reflux Patient stated he increased his omeprazole to twice a day  And is feeling much better

## 2014-10-14 NOTE — Patient Instructions (Addendum)
DASH Eating Plan DASH stands for "Dietary Approaches to Stop Hypertension." The DASH eating plan is a healthy eating plan that has been shown to reduce high blood pressure (hypertension). Additional health benefits may include reducing the risk of type 2 diabetes mellitus, heart disease, and stroke. The DASH eating plan may also help with weight loss. WHAT DO I NEED TO KNOW ABOUT THE DASH EATING PLAN? For the DASH eating plan, you will follow these general guidelines:  Choose foods with a percent daily value for sodium of less than 5% (as listed on the food label).  Use salt-free seasonings or herbs instead of table salt or sea salt.  Check with your health care provider or pharmacist before using salt substitutes.  Eat lower-sodium products, often labeled as "lower sodium" or "no salt added."  Eat fresh foods.  Eat more vegetables, fruits, and low-fat dairy products.  Choose whole grains. Look for the word "whole" as the first word in the ingredient list.  Choose fish and skinless chicken or turkey more often than red meat. Limit fish, poultry, and meat to 6 oz (170 g) each day.  Limit sweets, desserts, sugars, and sugary drinks.  Choose heart-healthy fats.  Limit cheese to 1 oz (28 g) per day.  Eat more home-cooked food and less restaurant, buffet, and fast food.  Limit fried foods.  Cook foods using methods other than frying.  Limit canned vegetables. If you do use them, rinse them well to decrease the sodium.  When eating at a restaurant, ask that your food be prepared with less salt, or no salt if possible. WHAT FOODS CAN I EAT? Seek help from a dietitian for individual calorie needs. Grains Whole grain or whole wheat bread. Brown rice. Whole grain or whole wheat pasta. Quinoa, bulgur, and whole grain cereals. Low-sodium cereals. Corn or whole wheat flour tortillas. Whole grain cornbread. Whole grain crackers. Low-sodium crackers. Vegetables Fresh or frozen vegetables  (raw, steamed, roasted, or grilled). Low-sodium or reduced-sodium tomato and vegetable juices. Low-sodium or reduced-sodium tomato sauce and paste. Low-sodium or reduced-sodium canned vegetables.  Fruits All fresh, canned (in natural juice), or frozen fruits. Meat and Other Protein Products Ground beef (85% or leaner), grass-fed beef, or beef trimmed of fat. Skinless chicken or turkey. Ground chicken or turkey. Pork trimmed of fat. All fish and seafood. Eggs. Dried beans, peas, or lentils. Unsalted nuts and seeds. Unsalted canned beans. Dairy Low-fat dairy products, such as skim or 1% milk, 2% or reduced-fat cheeses, low-fat ricotta or cottage cheese, or plain low-fat yogurt. Low-sodium or reduced-sodium cheeses. Fats and Oils Tub margarines without trans fats. Light or reduced-fat mayonnaise and salad dressings (reduced sodium). Avocado. Safflower, olive, or canola oils. Natural peanut or almond butter. Other Unsalted popcorn and pretzels. The items listed above may not be a complete list of recommended foods or beverages. Contact your dietitian for more options. WHAT FOODS ARE NOT RECOMMENDED? Grains White bread. White pasta. White rice. Refined cornbread. Bagels and croissants. Crackers that contain trans fat. Vegetables Creamed or fried vegetables. Vegetables in a cheese sauce. Regular canned vegetables. Regular canned tomato sauce and paste. Regular tomato and vegetable juices. Fruits Dried fruits. Canned fruit in light or heavy syrup. Fruit juice. Meat and Other Protein Products Fatty cuts of meat. Ribs, chicken wings, bacon, sausage, bologna, salami, chitterlings, fatback, hot dogs, bratwurst, and packaged luncheon meats. Salted nuts and seeds. Canned beans with salt. Dairy Whole or 2% milk, cream, half-and-half, and cream cheese. Whole-fat or sweetened yogurt. Full-fat   cheeses or blue cheese. Nondairy creamers and whipped toppings. Processed cheese, cheese spreads, or cheese  curds. Condiments Onion and garlic salt, seasoned salt, table salt, and sea salt. Canned and packaged gravies. Worcestershire sauce. Tartar sauce. Barbecue sauce. Teriyaki sauce. Soy sauce, including reduced sodium. Steak sauce. Fish sauce. Oyster sauce. Cocktail sauce. Horseradish. Ketchup and mustard. Meat flavorings and tenderizers. Bouillon cubes. Hot sauce. Tabasco sauce. Marinades. Taco seasonings. Relishes. Fats and Oils Butter, stick margarine, lard, shortening, ghee, and bacon fat. Coconut, palm kernel, or palm oils. Regular salad dressings. Other Pickles and olives. Salted popcorn and pretzels. The items listed above may not be a complete list of foods and beverages to avoid. Contact your dietitian for more information. WHERE CAN I FIND MORE INFORMATION? National Heart, Lung, and [employer] Institute: www.nhlbi.nih.gov/health/health-topics/topics/dash/ Document Released: 05/13/2011 Document Revised: 10/08/2013 Document Reviewed: 03/28/2013 ExitCare Patient Information 2015 [employer]. This information is not intended to replace advice given to you by your health care provider. Make sure you discuss any questions you have with your health care provider. Diabetes Mellitus and Food It is important for you to manage your blood sugar (glucose) level. Your blood glucose level can be greatly affected by what you eat. Eating healthier foods in the appropriate amounts throughout the day at about the same time each day will help you control your blood glucose level. It can also help slow or prevent worsening of your diabetes mellitus. Healthy eating may even help you improve the level of your blood pressure and reach or maintain a healthy weight.  HOW CAN FOOD AFFECT ME? Carbohydrates Carbohydrates affect your blood glucose level more than any other type of food. Your dietitian will help you determine how many carbohydrates to eat at each meal and teach you how to count carbohydrates. Counting  carbohydrates is important to keep your blood glucose at a healthy level, especially if you are using insulin or taking certain medicines for diabetes mellitus. Alcohol Alcohol can cause sudden decreases in blood glucose (hypoglycemia), especially if you use insulin or take certain medicines for diabetes mellitus. Hypoglycemia can be a life-threatening condition. Symptoms of hypoglycemia (sleepiness, dizziness, and disorientation) are similar to symptoms of having too much alcohol.  If your health care provider has given you approval to drink alcohol, do so in moderation and use the following guidelines:  Women should not have more than one drink per day, and men should not have more than two drinks per day. One drink is equal to:  12 oz of beer.  5 oz of wine.  1 oz of hard liquor.  Do not drink on an empty stomach.  Keep yourself hydrated. Have water, diet soda, or unsweetened iced tea.  Regular soda, juice, and other mixers might contain a lot of carbohydrates and should be counted. WHAT FOODS ARE NOT RECOMMENDED? As you make food choices, it is important to remember that all foods are not the same. Some foods have fewer nutrients per serving than other foods, even though they might have the same number of calories or carbohydrates. It is difficult to get your body what it needs when you eat foods with fewer nutrients. Examples of foods that you should avoid that are high in calories and carbohydrates but low in nutrients include:  Trans fats (most processed foods list trans fats on the Nutrition Facts label).  Regular soda.  Juice.  Candy.  Sweets, such as cake, pie, doughnuts, and cookies.  Fried foods. WHAT FOODS CAN I EAT? Have nutrient-rich foods,   which will nourish your body and keep you healthy. The food you should eat also will depend on several factors, including:  The calories you need.  The medicines you take.  Your weight.  Your blood glucose level.  Your  blood pressure level.  Your cholesterol level. You also should eat a variety of foods, including:  Protein, such as meat, poultry, fish, tofu, nuts, and seeds (lean animal proteins are best).  Fruits.  Vegetables.  Dairy products, such as milk, cheese, and yogurt (low fat is best).  Breads, grains, pasta, cereal, rice, and beans.  Fats such as olive oil, trans fat-free margarine, canola oil, avocado, and olives. DOES EVERYONE WITH DIABETES MELLITUS HAVE THE SAME MEAL PLAN? Because every person with diabetes mellitus is different, there is not one meal plan that works for everyone. It is very important that you meet with a dietitian who will help you create a meal plan that is just right for you. Document Released: 02/18/2005 Document Revised: 05/29/2013 Document Reviewed: 04/20/2013 ExitCare Patient Information 2015 [employer]. This information is not intended to replace advice given to you by your health care provider. Make sure you discuss any questions you have with your health care provider.  

## 2014-10-14 NOTE — Progress Notes (Signed)
MRN: [id number] Name: Chad Wolfe  Sex: male Age: 49 y.o. DOB: 11/10/1965  Allergies: Ibuprofen and Other  Chief Complaint  Patient presents with  . Follow-up    HPI: Patient is 49 y.o. male who Has to of hypertension, GERD/dyspepsia, chronic hep C comes today for followup her as per patient he was seen by nurse practitionerTiffany 2 weeks ago and his Prilosec dose was increased after that he reports improvement in the symptoms, he is compliant in taking his medications his blood pressure is controlled, history of chronic hep C patient has appointment with ID, previous blood work reviewed with the patient noticed impaired fasting glucose, patient does report family history of diabetes.his last hemoglobin A1c was 6.1%.  Past Medical History  Diagnosis Date  . Hypertension   . Hyperlipidemia   . Gastric ulcer   . Hepatitis C   . Substance abuse     Past Surgical History  Procedure Laterality Date  . Stomach surgery        Medication List       This list is accurate as of: 10/14/14 11:00 AM.  Always use your most recent med list.               cloNIDine 0.1 MG tablet  Commonly known as:  CATAPRES  Take 0.1 mg by mouth every 6 (six) hours as needed (withdrawl).     cloNIDine 0.1 MG tablet  Commonly known as:  CATAPRES  0.05mg  QID for 2 days then 0.025mg  QID for 2 days     cyclobenzaprine 5 MG tablet  Commonly known as:  FLEXERIL  Take 1 tablet (5 mg total) by mouth 3 (three) times daily as needed for muscle spasms.     lisinopril 5 MG tablet  Commonly known as:  PRINIVIL,ZESTRIL  Take 1 tablet (5 mg total) by mouth daily.     loperamide 2 MG capsule  Commonly known as:  IMODIUM  Take 2 capsules (4 mg total) by mouth 4 (four) times daily as needed for diarrhea or loose stools.     MENS MULTI VITAMIN & MINERAL PO  Take 1 tablet by mouth daily.     omeprazole 20 MG capsule  Commonly known as:  PRILOSEC  Take 1 capsule (20 mg total) by mouth 2 (two)  times daily before a meal.     ondansetron 8 MG disintegrating tablet  Commonly known as:  ZOFRAN ODT  Take 1 tablet (8 mg total) by mouth every 8 (eight) hours as needed for nausea or vomiting.     promethazine 25 MG tablet  Commonly known as:  PHENERGAN  Take 1 tablet (25 mg total) by mouth every 8 (eight) hours as needed for nausea or vomiting.     Vitamin D (Ergocalciferol) 50000 UNITS Caps capsule  Commonly known as:  DRISDOL  Take 1 capsule (50,000 Units total) by mouth every 7 (seven) days.        No orders of the defined types were placed in this encounter.     There is no immunization history on file for this patient.  Family History  Problem Relation Age of Onset  . Diabetes Father   . Hypertension Father   . Diabetes Sister   . Hypertension Sister   . Diabetes Brother   . Hypertension Brother     History  Substance Use Topics  . Smoking status: Current Some Day Smoker  . Smokeless tobacco: Not on file  . Alcohol Use: No  Review of Systems   As noted in HPI  Filed Vitals:   10/14/14 0937  BP: 126/84  Pulse: 92  Temp: 98.6 F (37 C)  Resp: 16    Physical Exam  Physical Exam  Constitutional: No distress.  Eyes: EOM are normal. Pupils are equal, round, and reactive to light.  Cardiovascular: Normal rate and regular rhythm.   Pulmonary/Chest: Breath sounds normal. No respiratory distress. He has no wheezes. He has no rales.  Musculoskeletal: He exhibits no edema.    CBC    Component Value Date/Time   WBC 8.6 08/26/2014 1102   RBC 4.50 08/26/2014 1102   HGB 15.0 08/26/2014 1102   HCT 42.1 08/26/2014 1102   PLT 207 08/26/2014 1102   MCV 93.6 08/26/2014 1102   LYMPHSABS 2.8 08/03/2013 1542   MONOABS 0.9 08/03/2013 1542   EOSABS 0.0 08/03/2013 1542   BASOSABS 0.0 08/03/2013 1542    CMP     Component Value Date/Time   NA 140 09/02/2014 1046   K 4.7 09/02/2014 1046   CL 104 09/02/2014 1046   CO2 25 09/02/2014 1046   GLUCOSE 163*  09/02/2014 1046   BUN 7 09/02/2014 1046   CREATININE 0.90 09/02/2014 1046   CREATININE 0.99 08/26/2014 1102   CALCIUM 9.2 09/02/2014 1046   PROT 7.2 09/02/2014 1046   ALBUMIN 4.1 09/02/2014 1046   AST 22 09/02/2014 1046   ALT 35 09/02/2014 1046   ALKPHOS 70 09/02/2014 1046   BILITOT 0.4 09/02/2014 1046   GFRNONAA >89 09/02/2014 1046   GFRNONAA >90 08/26/2014 1102   GFRAA >89 09/02/2014 1046   GFRAA >90 08/26/2014 1102    Lab Results  Component Value Date/Time   CHOL 212* 12/14/2007 09:30 PM    Lab Results  Component Value Date/Time   HGBA1C 6.1* 09/02/2014 10:46 AM    Lab Results  Component Value Date/Time   AST 22 09/02/2014 10:46 AM    Assessment and Plan  Dyspepsia Symptomatically improved continue with Prilosec twice a day  Essential hypertension Blood pressure is well-controlled continued current meds. Will check blood chemistry on the following visit  Chronic hepatitis C without hepatic coma Has scheduled appointment with ID.  Vitamin D deficiency Patient is taking prescription dose of vitamin D.  Prediabetes Advised patient for diabetes meal planning, check A1c on the following visit.    Return in about 3 months (around 01/14/2015).   This note has been created with Education officer, environmentalDragon speech recognition software and smart phrase technology. Any transcriptional errors are unintentional.    Doris CheadleADVANI, Karalyn Kadel, MD

## 2014-10-21 ENCOUNTER — Other Ambulatory Visit: Payer: Self-pay

## 2014-10-28 ENCOUNTER — Other Ambulatory Visit: Payer: Self-pay

## 2015-01-02 ENCOUNTER — Other Ambulatory Visit: Payer: Self-pay

## 2015-01-02 ENCOUNTER — Other Ambulatory Visit: Payer: Self-pay | Admitting: Internal Medicine

## 2015-01-02 DIAGNOSIS — I10 Essential (primary) hypertension: Secondary | ICD-10-CM

## 2015-01-02 MED ORDER — LISINOPRIL 5 MG PO TABS: 5.0000 mg | ORAL_TABLET | Freq: Every day | ORAL | Status: DC

## 2015-01-02 NOTE — Progress Notes (Unsigned)
Patient came into the office requesting a refill on his lisinopril [facility]

## 2015-01-08 ENCOUNTER — Ambulatory Visit: Payer: Self-pay | Admitting: Internal Medicine

## 2015-02-08 ENCOUNTER — Emergency Department (HOSPITAL_COMMUNITY)
Admission: EM | Admit: 2015-02-08 | Discharge: 2015-02-08 | Disposition: A | Discharge status: Home / Self Care | Payer: Self-pay | Attending: Emergency Medicine | Admitting: Emergency Medicine

## 2015-02-08 ENCOUNTER — Encounter (HOSPITAL_COMMUNITY): Payer: Self-pay | Admitting: Emergency Medicine

## 2015-02-08 DIAGNOSIS — Z720 Tobacco use: Secondary | ICD-10-CM | POA: Insufficient documentation

## 2015-02-08 DIAGNOSIS — R1013 Epigastric pain: Secondary | ICD-10-CM | POA: Insufficient documentation

## 2015-02-08 DIAGNOSIS — I10 Essential (primary) hypertension: Secondary | ICD-10-CM | POA: Insufficient documentation

## 2015-02-08 DIAGNOSIS — Z79899 Other long term (current) drug therapy: Secondary | ICD-10-CM | POA: Insufficient documentation

## 2015-02-08 DIAGNOSIS — E785 Hyperlipidemia, unspecified: Secondary | ICD-10-CM | POA: Insufficient documentation

## 2015-02-08 DIAGNOSIS — Z8619 Personal history of other infectious and parasitic diseases: Secondary | ICD-10-CM | POA: Insufficient documentation

## 2015-02-08 LAB — CBC
HCT: 49.7 % (ref 39.0–52.0)
HEMOGLOBIN: 17.1 g/dL — AB (ref 13.0–17.0)
MCH: 32.8 pg (ref 26.0–34.0)
MCHC: 34.4 g/dL (ref 30.0–36.0)
MCV: 95.4 fL (ref 78.0–100.0)
Platelets: 243 10*3/uL (ref 150–400)
RBC: 5.21 MIL/uL (ref 4.22–5.81)
RDW: 12.5 % (ref 11.5–15.5)
WBC: 7.4 10*3/uL (ref 4.0–10.5)

## 2015-02-08 LAB — URINALYSIS, ROUTINE W REFLEX MICROSCOPIC
Bilirubin Urine: NEGATIVE
GLUCOSE, UA: 250 mg/dL — AB
HGB URINE DIPSTICK: NEGATIVE
Ketones, ur: 15 mg/dL — AB
Leukocytes, UA: NEGATIVE
Nitrite: NEGATIVE
PH: 5.5 (ref 5.0–8.0)
Protein, ur: NEGATIVE mg/dL
SPECIFIC GRAVITY, URINE: 1.014 (ref 1.005–1.030)
Urobilinogen, UA: 0.2 mg/dL (ref 0.0–1.0)

## 2015-02-08 LAB — COMPREHENSIVE METABOLIC PANEL
ALBUMIN: 3.8 g/dL (ref 3.5–5.0)
ALK PHOS: 82 U/L (ref 38–126)
ALT: 68 U/L — ABNORMAL HIGH (ref 17–63)
ANION GAP: 12 (ref 5–15)
AST: 84 U/L — ABNORMAL HIGH (ref 15–41)
BUN: 6 mg/dL (ref 6–20)
CO2: 22 mmol/L (ref 22–32)
Calcium: 8.3 mg/dL — ABNORMAL LOW (ref 8.9–10.3)
Chloride: 104 mmol/L (ref 101–111)
Creatinine, Ser: 1.17 mg/dL (ref 0.61–1.24)
GFR calc Af Amer: >60 mL/min (ref >60)
GFR calc non Af Amer: >60 mL/min (ref >60)
GLUCOSE: 139 mg/dL — AB (ref 65–99)
POTASSIUM: 4.1 mmol/L (ref 3.5–5.1)
SODIUM: 138 mmol/L (ref 135–145)
Total Bilirubin: 0.7 mg/dL (ref 0.3–1.2)
Total Protein: 7.8 g/dL (ref 6.5–8.1)

## 2015-02-08 LAB — LIPASE, BLOOD: Lipase: 28 U/L (ref 22–51)

## 2015-02-08 MED ORDER — MORPHINE SULFATE (PF) 4 MG/ML IV SOLN
4.0000 mg | Freq: Once | INTRAVENOUS | Status: AC
  Administered 2015-02-08: 4 mg via INTRAVENOUS
  Filled 2015-02-08: qty 1

## 2015-02-08 MED ORDER — CLONIDINE HCL 0.1 MG PO TABS: 0.1000 mg | ORAL_TABLET | Freq: Every day | ORAL | Status: DC

## 2015-02-08 MED ORDER — LISINOPRIL 5 MG PO TABS: 5.0000 mg | ORAL_TABLET | Freq: Every day | ORAL | Status: DC

## 2015-02-08 MED ORDER — LIDOCAINE VISCOUS 2 % MT SOLN
15.0000 mL | Freq: Once | OROMUCOSAL | Status: AC
  Administered 2015-02-08: 15 mL via OROMUCOSAL
  Filled 2015-02-08: qty 15

## 2015-02-08 MED ORDER — SODIUM CHLORIDE 0.9 % IV BOLUS (SEPSIS)
1000.0000 mL | Freq: Once | INTRAVENOUS | Status: AC
  Administered 2015-02-08: 1000 mL via INTRAVENOUS

## 2015-02-08 MED ORDER — ALUM & MAG HYDROXIDE-SIMETH 200-200-20 MG/5ML PO SUSP
15.0000 mL | Freq: Once | ORAL | Status: AC
  Administered 2015-02-08: 15 mL via ORAL
  Filled 2015-02-08: qty 30

## 2015-02-08 MED ORDER — PANTOPRAZOLE SODIUM 20 MG PO TBEC: 20.0000 mg | DELAYED_RELEASE_TABLET | Freq: Every day | ORAL | Status: DC

## 2015-02-08 MED ORDER — ONDANSETRON HCL 4 MG/2ML IJ SOLN
4.0000 mg | Freq: Once | INTRAMUSCULAR | Status: AC
  Administered 2015-02-08: 4 mg via INTRAVENOUS
  Filled 2015-02-08: qty 2

## 2015-02-08 NOTE — Discharge Instructions (Signed)

## 2015-02-08 NOTE — ED Provider Notes (Signed)
CSN: [id number]     Arrival date & time 02/08/15  1252 History   First MD Initiated Contact with Patient 02/08/15 1332     Chief Complaint  Patient presents with  . Abdominal Pain     (Consider location/radiation/quality/duration/timing/severity/associated sxs/prior Treatment) Patient is a 49 y.o. male presenting with abdominal pain. The history is provided by the patient.  Abdominal Pain Pain location:  Epigastric Pain quality: burning and sharp   Pain radiates to:  Does not radiate Pain severity:  Moderate Onset quality:  Sudden Timing:  Constant Progression:  Unchanged Chronicity:  New Relieved by:  Nothing Worsened by:  Nothing tried Ineffective treatments:  None tried Associated symptoms: no chest pain, no chills, no diarrhea, no fever, no shortness of breath and no vomiting    49 yo M with a chief complaint of epigastric abdominal pain. Pain states going on for at least a month. Usually worsening with drinking of alcohol. Patient states he drinks alcohol every day. Denies any vomiting but has had some nausea. Had one loose stool couple days ago. Denies any radiation to the back. Denies fevers or chills.  Past Medical History  Diagnosis Date  . Hypertension   . Hyperlipidemia   . Gastric ulcer   . Hepatitis C   . Substance abuse    Past Surgical History  Procedure Laterality Date  . Stomach surgery     Family History  Problem Relation Age of Onset  . Diabetes Father   . Hypertension Father   . Diabetes Sister   . Hypertension Sister   . Diabetes Brother   . Hypertension Brother    Social History  Substance Use Topics  . Smoking status: Current Some Day Smoker  . Smokeless tobacco: None  . Alcohol Use: No    Review of Systems  Constitutional: Negative for fever and chills.  HENT: Negative for congestion and facial swelling.   Eyes: Negative for discharge and visual disturbance.  Respiratory: Negative for shortness of breath.   Cardiovascular: Negative  for chest pain and palpitations.  Gastrointestinal: Positive for abdominal pain. Negative for vomiting and diarrhea.  Musculoskeletal: Negative for myalgias and arthralgias.  Skin: Negative for color change and rash.  Neurological: Negative for tremors, syncope and headaches.  Psychiatric/Behavioral: Negative for confusion and dysphoric mood.      Allergies  Ibuprofen and Other  Home Medications   Prior to Admission medications   Medication Sig Start Date End Date Taking? Authorizing Provider  cloNIDine (CATAPRES) 0.1 MG tablet Take 1 tablet (0.1 mg total) by mouth daily. 02/08/15   Melene Plan, DO  lisinopril (PRINIVIL,ZESTRIL) 5 MG tablet Take 1 tablet (5 mg total) by mouth daily. 02/08/15   Melene Plan, DO  pantoprazole (PROTONIX) 20 MG tablet Take 1 tablet (20 mg total) by mouth daily. 02/08/15   Melene Plan, DO   BP 135/76 mmHg  Pulse 77  Temp(Src) 99.1 F (37.3 C) (Oral)  Resp 16  Ht  (1.778 m)  Wt 180 lb (81.647 kg)  BMI 25.83 kg/m2  SpO2 97% Physical Exam  Constitutional: He is oriented to person, place, and time. He appears well-developed and well-nourished.  HENT:  Head: Normocephalic and atraumatic.  Eyes: EOM are normal. Pupils are equal, round, and reactive to light.  Neck: Normal range of motion. Neck supple. No JVD present.  Cardiovascular: Normal rate and regular rhythm.  Exam reveals no gallop and no friction rub.   No murmur heard. Pulmonary/Chest: No respiratory distress. He has no  wheezes.  Abdominal: He exhibits no distension. There is tenderness (Mild epigastric). There is no rebound and no guarding.  Musculoskeletal: Normal range of motion.  Neurological: He is alert and oriented to person, place, and time.  Skin: No rash noted. No pallor.  Psychiatric: He has a normal mood and affect. His behavior is normal.    ED Course  Procedures (including critical care time) Labs Review Labs Reviewed  COMPREHENSIVE METABOLIC PANEL - Abnormal; Notable for the  following:    Glucose, Bld 139 (*)    Calcium 8.3 (*)    AST 84 (*)    ALT 68 (*)    All other components within normal limits  CBC - Abnormal; Notable for the following:    Hemoglobin 17.1 (*)    All other components within normal limits  LIPASE, BLOOD  URINALYSIS, ROUTINE W REFLEX MICROSCOPIC (NOT AT [facility])    Imaging Review No results found. I have personally reviewed and evaluated these images and lab results as part of my medical decision-making.   EKG Interpretation None      MDM   Final diagnoses:  Epigastric pain    49 yo M with a chief complaint of epigastric abdominal pain. CBC CMP and lipase essentially unremarkable. Mild transaminitis. Patient's pain significant improved with a GI cocktail.  We'll treat for possible gastritis,He will take Zantac for the pain. PCP follow-up. Requesting home meds refilled. We'll give him a short course of home meds.  3:30 PM:  I have discussed the diagnosis/risks/treatment options with the patient and family and believe the pt to be eligible for discharge home to follow-up with PCP. We also discussed returning to the ED immediately if new or worsening sx occur. We discussed the sx which are most concerning (e.g., sudden worsening pain, fever) that necessitate immediate return. Medications administered to the patient during their visit and any new prescriptions provided to the patient are listed below.  Medications given during this visit Medications  sodium chloride 0.9 % bolus 1,000 mL (1,000 mLs Intravenous New Bag/Given 02/08/15 1437)  morphine 4 MG/ML injection 4 mg (4 mg Intravenous Given 02/08/15 1435)  ondansetron (ZOFRAN) injection 4 mg (4 mg Intravenous Given 02/08/15 1439)  alum & mag hydroxide-simeth (MAALOX/MYLANTA) 200-200-20 MG/5ML suspension 15 mL (15 mLs Oral Given 02/08/15 1439)  lidocaine (XYLOCAINE) 2 % viscous mouth solution 15 mL (15 mLs Mouth/Throat Given 02/08/15 1440)    New Prescriptions   PANTOPRAZOLE (PROTONIX) 20  MG TABLET    Take 1 tablet (20 mg total) by mouth daily.     The patient appears reasonably screen and/or stabilized for discharge and I doubt any other medical condition or other [facility] requiring further screening, evaluation, or treatment in the ED at this time prior to discharge.      Melene Plan, DO 02/08/15 1530

## 2015-02-08 NOTE — ED Notes (Signed)
Pt c/o abdominal pain for weeks. Pt denies N/V.

## 2015-04-10 ENCOUNTER — Emergency Department (HOSPITAL_COMMUNITY)
Admission: EM | Admit: 2015-04-10 | Discharge: 2015-04-10 | Disposition: A | Discharge status: Home / Self Care | Payer: Self-pay | Attending: Emergency Medicine | Admitting: Emergency Medicine

## 2015-04-10 ENCOUNTER — Emergency Department (HOSPITAL_COMMUNITY): Payer: Self-pay

## 2015-04-10 ENCOUNTER — Encounter (HOSPITAL_COMMUNITY): Payer: Self-pay | Admitting: Emergency Medicine

## 2015-04-10 DIAGNOSIS — Z8639 Personal history of other endocrine, nutritional and metabolic disease: Secondary | ICD-10-CM | POA: Insufficient documentation

## 2015-04-10 DIAGNOSIS — R110 Nausea: Secondary | ICD-10-CM | POA: Insufficient documentation

## 2015-04-10 DIAGNOSIS — Z720 Tobacco use: Secondary | ICD-10-CM | POA: Insufficient documentation

## 2015-04-10 DIAGNOSIS — K259 Gastric ulcer, unspecified as acute or chronic, without hemorrhage or perforation: Secondary | ICD-10-CM | POA: Insufficient documentation

## 2015-04-10 DIAGNOSIS — I10 Essential (primary) hypertension: Secondary | ICD-10-CM | POA: Insufficient documentation

## 2015-04-10 DIAGNOSIS — R1013 Epigastric pain: Secondary | ICD-10-CM | POA: Insufficient documentation

## 2015-04-10 DIAGNOSIS — Z8619 Personal history of other infectious and parasitic diseases: Secondary | ICD-10-CM | POA: Insufficient documentation

## 2015-04-10 LAB — URINALYSIS, ROUTINE W REFLEX MICROSCOPIC
BILIRUBIN URINE: NEGATIVE
GLUCOSE, UA: 250 mg/dL — AB
HGB URINE DIPSTICK: NEGATIVE
Ketones, ur: 40 mg/dL — AB
Leukocytes, UA: NEGATIVE
Nitrite: NEGATIVE
PROTEIN: NEGATIVE mg/dL
Specific Gravity, Urine: 1.021 (ref 1.005–1.030)
UROBILINOGEN UA: 0.2 mg/dL (ref 0.0–1.0)
pH: 6 (ref 5.0–8.0)

## 2015-04-10 LAB — COMPREHENSIVE METABOLIC PANEL
ALBUMIN: 3.6 g/dL (ref 3.5–5.0)
ALK PHOS: 66 U/L (ref 38–126)
ALT: 43 U/L (ref 17–63)
ANION GAP: 11 (ref 5–15)
AST: 61 U/L — AB (ref 15–41)
BILIRUBIN TOTAL: 0.7 mg/dL (ref 0.3–1.2)
CALCIUM: 8.9 mg/dL (ref 8.9–10.3)
CO2: 21 mmol/L — ABNORMAL LOW (ref 22–32)
CREATININE: 0.95 mg/dL (ref 0.61–1.24)
Chloride: 106 mmol/L (ref 101–111)
GFR calc Af Amer: >60 mL/min (ref >60)
GFR calc non Af Amer: >60 mL/min (ref >60)
GLUCOSE: 178 mg/dL — AB (ref 65–99)
Potassium: 3.8 mmol/L (ref 3.5–5.1)
Sodium: 138 mmol/L (ref 135–145)
TOTAL PROTEIN: 7.3 g/dL (ref 6.5–8.1)

## 2015-04-10 LAB — CBC
HCT: 44.1 % (ref 39.0–52.0)
Hemoglobin: 15.7 g/dL (ref 13.0–17.0)
MCH: 32.8 pg (ref 26.0–34.0)
MCHC: 35.6 g/dL (ref 30.0–36.0)
MCV: 92.1 fL (ref 78.0–100.0)
PLATELETS: 246 10*3/uL (ref 150–400)
RBC: 4.79 MIL/uL (ref 4.22–5.81)
RDW: 11.6 % (ref 11.5–15.5)
WBC: 6.2 10*3/uL (ref 4.0–10.5)

## 2015-04-10 LAB — LIPASE, BLOOD: LIPASE: 23 U/L (ref 11–51)

## 2015-04-10 MED ORDER — OXYCODONE-ACETAMINOPHEN 5-325 MG PO TABS
ORAL_TABLET | ORAL | Status: AC
  Filled 2015-04-10: qty 1

## 2015-04-10 MED ORDER — MORPHINE SULFATE (PF) 4 MG/ML IV SOLN
4.0000 mg | Freq: Once | INTRAVENOUS | Status: AC
  Administered 2015-04-10: 4 mg via INTRAVENOUS
  Filled 2015-04-10: qty 1

## 2015-04-10 MED ORDER — SODIUM CHLORIDE 0.9 % IV BOLUS (SEPSIS)
1000.0000 mL | Freq: Once | INTRAVENOUS | Status: AC
  Administered 2015-04-10: 1000 mL via INTRAVENOUS

## 2015-04-10 MED ORDER — ONDANSETRON HCL 4 MG/2ML IJ SOLN
4.0000 mg | Freq: Once | INTRAMUSCULAR | Status: AC
Start: 2015-04-10 — End: 2015-04-10
  Administered 2015-04-10: 4 mg via INTRAVENOUS
  Filled 2015-04-10: qty 2

## 2015-04-10 MED ORDER — PANTOPRAZOLE SODIUM 40 MG PO TBEC: 40.0000 mg | DELAYED_RELEASE_TABLET | Freq: Two times a day (BID) | ORAL | Status: DC

## 2015-04-10 MED ORDER — OXYCODONE-ACETAMINOPHEN 5-325 MG PO TABS: 1.0000 | ORAL_TABLET | ORAL | Status: DC | PRN

## 2015-04-10 MED ORDER — PANTOPRAZOLE SODIUM 40 MG IV SOLR
40.0000 mg | Freq: Once | INTRAVENOUS | Status: AC
  Administered 2015-04-10: 40 mg via INTRAVENOUS
  Filled 2015-04-10: qty 40

## 2015-04-10 MED ORDER — OXYCODONE-ACETAMINOPHEN 5-325 MG PO TABS
1.0000 | ORAL_TABLET | Freq: Once | ORAL | Status: AC
  Administered 2015-04-10: 1 via ORAL
  Filled 2015-04-10: qty 1

## 2015-04-10 MED ORDER — OXYCODONE-ACETAMINOPHEN 5-325 MG PO TABS
1.0000 | ORAL_TABLET | Freq: Once | ORAL | Status: AC
  Administered 2015-04-10: 1 via ORAL

## 2015-04-10 NOTE — ED Notes (Signed)
Pt states for the last month he has had upper abd pain that is usually relieved by him drinking alcohol. Today he has drank 2 bottles of wine and 1 beer and his abd pain is worse. Pt deines n/v/d.

## 2015-04-10 NOTE — Discharge Instructions (Signed)

## 2015-04-10 NOTE — ED Provider Notes (Signed)
CSN: [id number]     Arrival date & time 04/10/15  1617 History   First MD Initiated Contact with Patient 04/10/15 2018     Chief Complaint  Patient presents with  . Abdominal Pain     (Consider location/radiation/quality/duration/timing/severity/associated sxs/prior Treatment) HPI  49 year old male with a history of hepatitis C presents with upper abdominal pain/epigastric pain for the past 1 month. Usually is relieved after drinking alcohol. Occasionally worse with food. Particularly spicy food or fatty foods. Has never had pain like this before. Has been gradually worsening and can't take it anymore over the last couple days. No fevers. No diarrhea. Has been having nausea and makes himself throw up to try and feel better. No hematemesis. Given a percocet in triage with partial temporary relief, pain now severe again.  Past Medical History  Diagnosis Date  . Hypertension   . Hyperlipidemia   . Gastric ulcer   . Hepatitis C   . Substance abuse    Past Surgical History  Procedure Laterality Date  . Stomach surgery     Family History  Problem Relation Age of Onset  . Diabetes Father   . Hypertension Father   . Diabetes Sister   . Hypertension Sister   . Diabetes Brother   . Hypertension Brother    Social History  Substance Use Topics  . Smoking status: Current Some Day Smoker  . Smokeless tobacco: None  . Alcohol Use: No    Review of Systems  Constitutional: Negative for fever.  Respiratory: Negative for cough and shortness of breath.   Cardiovascular: Negative for chest pain.  Gastrointestinal: Positive for nausea and abdominal pain. Negative for diarrhea.  Genitourinary: Negative for dysuria.  Musculoskeletal: Negative for back pain.  All other systems reviewed and are negative.     Allergies  Ibuprofen and Other  Home Medications   Prior to Admission medications   Medication Sig Start Date End Date Taking? Authorizing Provider  lisinopril  (PRINIVIL,ZESTRIL) 5 MG tablet Take 1 tablet (5 mg total) by mouth daily. 02/08/15  Yes Melene Planan Floyd, DO  cloNIDine (CATAPRES) 0.1 MG tablet Take 1 tablet (0.1 mg total) by mouth daily. Patient not taking: Reported on 04/10/2015 02/08/15   Melene Planan Floyd, DO  pantoprazole (PROTONIX) 20 MG tablet Take 1 tablet (20 mg total) by mouth daily. Patient not taking: Reported on 04/10/2015 02/08/15   Melene Planan Floyd, DO   BP 115/79 mmHg  Pulse 98  Temp(Src) 98.3 F (36.8 C) (Oral)  Resp 16  Ht 5\' 10"  (1.778 m)  Wt 180 lb (81.647 kg)  BMI 25.83 kg/m2  SpO2 98% Physical Exam  Constitutional: He is oriented to person, place, and time. He appears well-developed and well-nourished.  HENT:  Head: Normocephalic and atraumatic.  Right Ear: External ear normal.  Left Ear: External ear normal.  Nose: Nose normal.  Eyes: Right eye exhibits no discharge. Left eye exhibits no discharge.  Neck: Neck supple.  Cardiovascular: Normal rate, regular rhythm, normal heart sounds and intact distal pulses.   Pulmonary/Chest: Effort normal and breath sounds normal.  Abdominal: Soft. There is tenderness in the epigastric area. There is negative Murphy's sign.  Musculoskeletal: He exhibits no edema.  Neurological: He is alert and oriented to person, place, and time.  Skin: Skin is warm and dry.  Nursing note and vitals reviewed.   ED Course  Procedures (including critical care time) Labs Review Labs Reviewed  COMPREHENSIVE METABOLIC PANEL - Abnormal; Notable for the following:    CO2 21 (*)  Glucose, Bld 178 (*)    BUN <5 (*)    AST 61 (*)    All other components within normal limits  URINALYSIS, ROUTINE W REFLEX MICROSCOPIC (NOT AT [facility]) - Abnormal; Notable for the following:    Glucose, UA 250 (*)    Ketones, ur 40 (*)    All other components within normal limits  LIPASE, BLOOD  CBC    Imaging Review US Abdomen Complete  04/10/2015  CLINICAL DATA:  Epigastric pain for a month. EXAM: ULTRASOUND ABDOMEN COMPLETE  COMPARISON:  None. FINDINGS: Gallbladder: No gallstones or wall thickening visualized. No sonographic Murphy sign noted. Common bile duct: Diameter: 3.1 mm.  Limited visualization. Liver: Limited visualization due to body habitus and rib shadowing. No focal liver lesions identified as visualized. IVC: No abnormality visualized. Pancreas: Not seen due to overlying bowel gas. Spleen: Not seen due to overlying bowel gas and rib shadows. Right Kidney: Length: 10.9 cm. Limited visualization. No hydronephrosis. Left Kidney: Length: 9.7 cm. Limited visualization. No hydronephrosis. Abdominal aorta: Not visualized due to overlying bowel gas. Other findings: Examination is technically limited due to prominent bowel gas. IMPRESSION: Limited visualization due to bowel gas. No focal abnormalities demonstrated as visualized. Electronically Signed   By: Burman Nieves M.D.   On: 04/10/2015 22:34   I have personally reviewed and evaluated these images and lab results as part of my medical decision-making.   EKG Interpretation None      MDM   Final diagnoses:  Epigastric abdominal pain    Patient's epigastric abdominal pain could be from pancreatitis although his lipase is unremarkable. More likely is from gastritis, possibly from significant alcohol use. Plan to treat with PPI as well as short course of narcotics. He has a PCP, strongly encouraged follow-up with them as he may need a GI referral in the future. No signs of acute GI bleeding. Highly doubt other intra-abdominal causes such as bowel obstruction or atypical symptoms such as coronary disease.    Pricilla Loveless, MD 04/10/15 2300

## 2015-04-24 ENCOUNTER — Ambulatory Visit: Payer: Self-pay | Admitting: Internal Medicine

## 2015-05-05 ENCOUNTER — Emergency Department (HOSPITAL_COMMUNITY)
Admission: EM | Admit: 2015-05-05 | Discharge: 2015-05-05 | Disposition: A | Discharge status: Home / Self Care | Payer: Self-pay | Attending: Emergency Medicine | Admitting: Emergency Medicine

## 2015-05-05 ENCOUNTER — Encounter (HOSPITAL_BASED_OUTPATIENT_CLINIC_OR_DEPARTMENT_OTHER): Payer: Self-pay | Admitting: Clinical

## 2015-05-05 ENCOUNTER — Encounter (HOSPITAL_COMMUNITY): Payer: Self-pay | Admitting: Family Medicine

## 2015-05-05 ENCOUNTER — Encounter: Payer: Self-pay | Admitting: Family Medicine

## 2015-05-05 ENCOUNTER — Ambulatory Visit: Payer: Self-pay | Attending: Family Medicine | Admitting: Family Medicine

## 2015-05-05 VITALS — BP 132/89 | HR 109 | Temp 98.1°F | Resp 18 | Ht 69.0 in

## 2015-05-05 DIAGNOSIS — F17200 Nicotine dependence, unspecified, uncomplicated: Secondary | ICD-10-CM | POA: Insufficient documentation

## 2015-05-05 DIAGNOSIS — B182 Chronic viral hepatitis C: Secondary | ICD-10-CM | POA: Insufficient documentation

## 2015-05-05 DIAGNOSIS — F329 Major depressive disorder, single episode, unspecified: Secondary | ICD-10-CM

## 2015-05-05 DIAGNOSIS — R109 Unspecified abdominal pain: Secondary | ICD-10-CM

## 2015-05-05 DIAGNOSIS — F32A Depression, unspecified: Secondary | ICD-10-CM

## 2015-05-05 DIAGNOSIS — F1010 Alcohol abuse, uncomplicated: Secondary | ICD-10-CM

## 2015-05-05 DIAGNOSIS — X58XXXA Exposure to other specified factors, initial encounter: Secondary | ICD-10-CM | POA: Insufficient documentation

## 2015-05-05 DIAGNOSIS — Z8619 Personal history of other infectious and parasitic diseases: Secondary | ICD-10-CM | POA: Insufficient documentation

## 2015-05-05 DIAGNOSIS — R1013 Epigastric pain: Secondary | ICD-10-CM | POA: Insufficient documentation

## 2015-05-05 DIAGNOSIS — Z79899 Other long term (current) drug therapy: Secondary | ICD-10-CM | POA: Insufficient documentation

## 2015-05-05 DIAGNOSIS — R531 Weakness: Secondary | ICD-10-CM | POA: Insufficient documentation

## 2015-05-05 DIAGNOSIS — Z8639 Personal history of other endocrine, nutritional and metabolic disease: Secondary | ICD-10-CM | POA: Insufficient documentation

## 2015-05-05 DIAGNOSIS — Z9889 Other specified postprocedural states: Secondary | ICD-10-CM | POA: Insufficient documentation

## 2015-05-05 DIAGNOSIS — W07XXXA Fall from chair, initial encounter: Secondary | ICD-10-CM | POA: Insufficient documentation

## 2015-05-05 DIAGNOSIS — Z8719 Personal history of other diseases of the digestive system: Secondary | ICD-10-CM | POA: Insufficient documentation

## 2015-05-05 DIAGNOSIS — I10 Essential (primary) hypertension: Secondary | ICD-10-CM | POA: Insufficient documentation

## 2015-05-05 DIAGNOSIS — R112 Nausea with vomiting, unspecified: Secondary | ICD-10-CM | POA: Insufficient documentation

## 2015-05-05 LAB — CBC
HCT: 49 % (ref 39.0–52.0)
Hemoglobin: 17.3 g/dL — ABNORMAL HIGH (ref 13.0–17.0)
MCH: 33.9 pg (ref 26.0–34.0)
MCHC: 35.3 g/dL (ref 30.0–36.0)
MCV: 96.1 fL (ref 78.0–100.0)
Platelets: 238 10*3/uL (ref 150–400)
RBC: 5.1 MIL/uL (ref 4.22–5.81)
RDW: 12.3 % (ref 11.5–15.5)
WBC: 8.1 10*3/uL (ref 4.0–10.5)

## 2015-05-05 LAB — COMPREHENSIVE METABOLIC PANEL
ALK PHOS: 88 U/L (ref 38–126)
ALT: 103 U/L — AB (ref 17–63)
AST: 164 U/L — ABNORMAL HIGH (ref 15–41)
Albumin: 4 g/dL (ref 3.5–5.0)
Anion gap: 14 (ref 5–15)
BILIRUBIN TOTAL: 1.8 mg/dL — AB (ref 0.3–1.2)
CALCIUM: 8.8 mg/dL — AB (ref 8.9–10.3)
CO2: 20 mmol/L — ABNORMAL LOW (ref 22–32)
CREATININE: 0.97 mg/dL (ref 0.61–1.24)
Chloride: 105 mmol/L (ref 101–111)
Glucose, Bld: 234 mg/dL — ABNORMAL HIGH (ref 65–99)
Potassium: 3.9 mmol/L (ref 3.5–5.1)
Sodium: 139 mmol/L (ref 135–145)
Total Protein: 7.8 g/dL (ref 6.5–8.1)

## 2015-05-05 LAB — GLUCOSE, POCT (MANUAL RESULT ENTRY): POC Glucose: 169 mg/dl — AB (ref 70–99)

## 2015-05-05 LAB — LIPASE, BLOOD: Lipase: 26 U/L (ref 11–51)

## 2015-05-05 MED ORDER — ONDANSETRON 4 MG PO TBDP
4.0000 mg | ORAL_TABLET | Freq: Once | ORAL | Status: AC
  Administered 2015-05-05: 4 mg via ORAL
  Filled 2015-05-05: qty 1

## 2015-05-05 MED ORDER — GI COCKTAIL ~~LOC~~
30.0000 mL | Freq: Once | ORAL | Status: AC
  Administered 2015-05-05: 30 mL via ORAL
  Filled 2015-05-05: qty 30

## 2015-05-05 MED ORDER — GI COCKTAIL ~~LOC~~
30.0000 mL | Freq: Once | ORAL | Status: AC
  Administered 2015-05-05: 30 mL via ORAL

## 2015-05-05 MED ORDER — ONDANSETRON HCL 4 MG PO TABS
8.0000 mg | ORAL_TABLET | Freq: Once | ORAL | Status: AC
  Administered 2015-05-05: 8 mg via ORAL

## 2015-05-05 MED ORDER — TRAMADOL HCL 50 MG PO TABS: 50.0000 mg | ORAL_TABLET | Freq: Three times a day (TID) | ORAL | Status: DC | PRN

## 2015-05-05 MED ORDER — OMEPRAZOLE 20 MG PO CPDR: 20.0000 mg | DELAYED_RELEASE_CAPSULE | Freq: Every day | ORAL | Status: DC

## 2015-05-05 MED ORDER — SUCRALFATE 1 G PO TABS: 1.0000 g | ORAL_TABLET | Freq: Three times a day (TID) | ORAL | Status: DC

## 2015-05-05 NOTE — ED Notes (Signed)
Pt here for abd pain, admitted to drinking a fifth of liquor. BP 130/80. Pulse 90. Hx of pancreatitis

## 2015-05-05 NOTE — ED Notes (Signed)
Pt ambulated with no difficulty

## 2015-05-05 NOTE — Discharge Instructions (Signed)
1. Medications: usual home medications 2. Treatment: rest, drink plenty of fluids 3. Follow Up: please followup with your primary doctor for discussion of your diagnoses and further evaluation after today's visit; if you do not have a primary care doctor use the resource guide provided to find one; please return to the ER for high fever, severe pain, persistent vomiting, new or worsening symptoms   Abdominal Pain, Adult Many things can cause abdominal pain. Usually, abdominal pain is not caused by a disease and will improve without treatment. It can often be observed and treated at home. Your health care provider will do a physical exam and possibly order blood tests and X-rays to help determine the seriousness of your pain. However, in many cases, more time must pass before a clear cause of the pain can be found. Before that point, your health care provider may not know if you need more testing or further treatment. HOME CARE INSTRUCTIONS Monitor your abdominal pain for any changes. The following actions may help to alleviate any discomfort you are experiencing:  Only take over-the-counter or prescription medicines as directed by your health care provider.  Do not take laxatives unless directed to do so by your health care provider.  Try a clear liquid diet (broth, tea, or water) as directed by your health care provider. Slowly move to a bland diet as tolerated. SEEK MEDICAL CARE IF:  You have unexplained abdominal pain.  You have abdominal pain associated with nausea or diarrhea.  You have pain when you urinate or have a bowel movement.  You experience abdominal pain that wakes you in the night.  You have abdominal pain that is worsened or improved by eating food.  You have abdominal pain that is worsened with eating fatty foods.  You have a fever. SEEK IMMEDIATE MEDICAL CARE IF:  Your pain does not go away within 2 hours.  You keep throwing up (vomiting).  Your pain is felt  only in portions of the abdomen, such as the right side or the left lower portion of the abdomen.  You pass bloody or black tarry stools. MAKE SURE YOU:  Understand these instructions.  Will watch your condition.  Will get help right away if you are not doing well or get worse.   This information is not intended to replace advice given to you by your health care provider. Make sure you discuss any questions you have with your health care provider.   Document Released: 03/03/2005 Document Revised: 02/12/2015 Document Reviewed: 01/31/2013 Elsevier Interactive Patient Education 2016 [employer].   Emergency Department Resource Guide 1) Find a Doctor and Pay Out of Pocket Although you won't have to find out who is covered by your insurance plan, it is a good idea to ask around and get recommendations. You will then need to call the office and see if the doctor you have chosen will accept you as a new patient and what types of options they offer for patients who are self-pay. Some doctors offer discounts or will set up payment plans for their patients who do not have insurance, but you will need to ask so you aren't surprised when you get to your appointment.  2) Contact Your Local Health Department Not all health departments have doctors that can see patients for sick visits, but many do, so it is worth a call to see if yours does. If you don't know where your local health department is, you can check in your phone book. The CDC  also has a tool to help you locate your state's health department, and many state websites also have listings of all of their local health departments.  3) Find a Walk-in Clinic If your illness is not likely to be very severe or complicated, you may want to try a walk in clinic. These are popping up all over the country in pharmacies, drugstores, and shopping centers. They're usually staffed by nurse practitioners or physician assistants that have been trained to treat  common illnesses and complaints. They're usually fairly quick and inexpensive. However, if you have serious medical issues or chronic medical problems, these are probably not your best option.  No Primary Care Doctor: - Call Health Connect at  [phone] - they can help you locate a primary care doctor that  accepts your insurance, provides certain services, etc. - Physician Referral Service- [phone]  Chronic Pain Problems: Organization         Address  Phone   Notes  [facility]  [phone] Patients need to be referred by their primary care doctor.   Medication Assistance: Organization         Address  Phone   Notes  [facility] Medication [facility] [address] [place], [place] [address] [phone] --Must be a resident of [facility] -- Must have NO insurance coverage whatsoever (no Medicaid/ Medicare, etc.) -- The pt. MUST have a primary care doctor that directs their care regularly and follows them in the community   MedAssist  [phone]   [employer]  [phone]    Agencies that provide inexpensive medical care: Organization         Address  Phone   Notes  [facility]  [phone]   [facility]    [phone]   [facility] [address] [place], [place] [address] [phone]) [phone]   [facility] [place] [address], [place] [phone]   Planned Parenthood    [phone]) [phone]   [facility]    [phone]   [facility]  [address], [place] Phone:  [phone], Fax:  [phone]) [phone] Hours of Operation:  9 am - 6 pm, M-F.  Also accepts Medicaid/Medicare and self-pay.  [facility]  [address], [place] Phone: [phone]) [phone], Fax: [phone]. Hours of Operation:  8:30 am - 5:30 pm, M-F.  Also accepts Medicaid and self-pay.  [facility] [place] [address], [place] Phone: [phone]   [facility] [address], [place], [place] [phone], Ext. [phone] Mondays & Thursdays: 7-9 AM.  First 15 patients are seen on a first come, first serve basis.    Medicaid-accepting [facility] Providers:  Organization         Address  Phone   Notes  [facility] [address], [place] [phone]) [phone] Also accepts self-pay patients.  [facility] [address], [place]  [phone]   [facility] [address], [place] [phone]   [facility] [address] [place] [address], [place] [phone]) [phone]   Renaye RakersVeita Bland [address], [place]   [phone] Only accepts [place] Access [place] patients after they have their name applied to their card.   Self-Pay (no insurance)  in [facility]:  Organization         Address  Phone   Notes  Sickle Cell Patients, [facility] [place] [phone]   [facility] [place] [phone]   [facility] [place]  [address], [place] [phone]   [facility]/Dr. Osei-Bonsu  [address], [place] or [address], [place] [phone] Phone number for both [place] and [place] locations is the same.  [facility] [address], [place] [phone]   [facility] [address] [place] or [address] [phone]   [facility] [address], [place] [phone], phone; [phone], fax Sees patients 1st and 3rd Saturday of every month.  Must not qualify for public or private insurance (i.e. Medicaid, Medicare, [place] Health Choice, Veterans' Benefits)  Household income should be no more than 200% of the  poverty level The clinic cannot treat you if you are pregnant or think you are pregnant  Sexually transmitted diseases are not treated at the clinic.    Dental Care: Organization         Address  Phone  Notes  [facility] [place] [phone] Accepts children up to age 33 who are enrolled in [place] or [place]; pregnant women with a Medicaid card; and children who have applied for Medicaid or [place] Health Choice, but were declined, whose parents can pay a reduced fee at time of service.  [facility]  [address], [place] [phone] Accepts children up to age 39 who are enrolled in [place] or [place]; pregnant women with a Medicaid card; and children who have applied for Medicaid or [place] Health Choice, but were declined, whose parents can pay a reduced fee at time of service.  [place] Adult Dental Access PROGRAM  [place] [phone] Patients are seen by appointment only. Walk-ins are not accepted. [place] will see patients 68 years of age and older. Monday - Tuesday (8am-5pm) Most Wednesdays (8:30-5pm) $30 per visit, cash only  [facility] Adult Dental Access PROGRAM  [address], [facility] [place] [phone] Patients are seen by appointment only. Walk-ins are not accepted. [place] will see patients 16 years of age and older. One Wednesday Evening (Monthly: Volunteer Based).  $30 per visit, cash only  [place]  [phone] for adults; Children under age 54, call Graduate Pediatric Dentistry at [phone]. Children aged 85-14, please call [phone] to request a pediatric application.  Dental services are provided in all areas of dental care including fillings, crowns and bridges, complete and partial dentures, implants, gum treatment, root canals, and extractions.  Preventive care is also provided. Treatment is provided to both adults and children. Patients are selected via a lottery and there is often a waiting list.   [facility] [address], [place]  [phone] [email or web address]   [facility] [address] [place], [place] [phone], Ext. [phone] Second and Fourth Thursday of each month, opens at 6:30 AM; Clinic ends at 9 AM.  Patients are seen on a first-come first-served basis, and a limited number are seen during  each clinic.   [facility]  [address] Hillard Danker [place], [place] [phone]   Eligibility Requirements You must have lived in [place], [place], or [place] counties for at least the last three months.   You cannot be eligible for state or federal sponsored [employer], including [employer], [place], or [employer].   You generally cannot be eligible for healthcare insurance through your employer.    How to apply: Eligibility screenings are held every Tuesday and Wednesday afternoon from 1:00 pm until 4:00 pm. You do not need an appointment for the interview!  [facility] [address], [place], [place]   [place]  [place] Department  [place]  [phone]    Behavioral Health Resources in the Community: Intensive Outpatient Programs Organization         Address  Phone  Notes  [place]. [address], [place], [place] [phone]   [facility]patient [address] [place], [place]   ADS: Alcohol & Drug Svcs [address] [place], [place]   [place] [address]  [place], [place] or [phone]   Substance Abuse Resources Organization         Address  Phone  Notes  Alcohol and Drug Services  [phone]   [place]  [phone]   The [place]   Chinita Pester  [phone]   Residential & Outpatient Substance Abuse Program  [phone]   Psychological Services Organization         Address  Phone  Notes  [facility]  [place]  [phone]   [place] [address] [place] or [phone]    Mobile Crisis Teams Organization         Address  Phone  Notes  [facility], Mobile Crisis Care Unit  [phone]   [facility]  [address] [place], [place]   [place] Levels [address], [place] [phone]    Self-Help/Support Groups Organization         Address  Phone             Notes  [place]. of [place] - variety of support groups  [place] Call for more information  Narcotics Anonymous (NA), Caring Services [address], [employer] [place]  2 meetings at this location   Special educational needs teacher         Address  Phone  Notes  ASAP Residential Treatment [place],    [place]  [phone]   [facility]  [address] [place] 712458, [place], [place]   [place], [place] [phone] Admissions: 8am-3pm M-F  Incentives Substance [place] [address]    [place], [place] [phone]   [facility] [address] Jadene Pierini [place], [place]   [facility] [address],  [place], [place]   [facility]ight Programs - Intensive Outpatient [address], [place], [place]   [facility] ([place].,  [place], [place] or [phone]   [facility]idential Treatment Services (RTS) [address], [place], [place] Accepts Medicaid  [facility] [place] [address],  [place]  [phone] Substance Abuse/Addiction Treatment   [facility] Organization         Address  Phone  Notes  [facility]  [phone]   Domenic Schwab, PhD [address] Arlis Porta [place], [place]   [phone] or [phone]   [place], [place] [phone]   [address], [place], [place] [phone] Insurance/Medicaid/sponsorship through [facility] and Families [address], [address] [place]                                    [place], [place] [phone] [place] [address], [place] [phone]    Dr. Adele Schilder  [phone]   [facility] Dept. 1) [address], [place] 2) [place] 3)  [place] [address], [place] [phone]  [phone]   [place] [phone] or [phone] (After Hours)

## 2015-05-05 NOTE — Progress Notes (Addendum)
Patient ID: Chad Wolfe, male   DOB: 09/26/1965, 49 y.o.   MRN: [id number]   Subjective:  Patient ID: Chad FerrierWilliam C Wolfe, male    DOB: 11/30/1965  Age: 49 y.o. MRN: [id number]  CC: Abdominal Pain   HPI Chad Wolfe presents for abdominal pain.   He arrived today to refill him medication at the pharmacy. He requested to use the restroom. Upon leaving the restroom he was noticed standing against the wall and complaining of abdominal pain by the front office staff. The front office staff called for help from the clinical staff. He was helped to a wheelchair. He was wheeled into the closest exam room for evaluation. His vital signs and blood sugar were checked by a nurse who came out to relay the information to me (see information below).  Upon returning to the room he was found sitting on the floor with between the the footrest. He was awake and alert. He attempted to get up but part of his clothes was caught on part of the wheelchair. He was helped back into the wheelchair by two nurses and myself.     Due to complain of pain and slip from wheelchair, he was added to my schedule for evaluation.  While in the room with me, he continued to express abdominal pain. He denied head trauma, headache. He was awake and alert during the entire visit.   1. Abdominal pain: seen in ED today. Intoxicated. Last drank this AM. Feeling weak and having  Abdominal pain. No emesis or fever. Requesting pain medicine.   Social History  Substance Use Topics  . Smoking status: Current Some Day Smoker  . Smokeless tobacco: Not on file  . Alcohol Use: 0.0 oz/week    0 Standard drinks or equivalent per week     Comment: drank a quart of beer this morning    Outpatient Prescriptions Prior to Visit  Medication Sig Dispense Refill  . lisinopril (PRINIVIL,ZESTRIL) 5 MG tablet Take 1 tablet (5 mg total) by mouth daily. 10 tablet 0  . omeprazole (PRILOSEC) 20 MG capsule Take 1 capsule (20 mg total) by mouth daily.  (Patient not taking: Reported on 05/05/2015) 30 capsule 0  . sucralfate (CARAFATE) 1 G tablet Take 1 tablet (1 g total) by mouth 4 (four) times daily -  with meals and at bedtime. (Patient not taking: Reported on 05/05/2015) 30 tablet 0   No facility-administered medications prior to visit.    ROS Review of Systems  Constitutional: Negative for fever, chills, fatigue and unexpected weight change.  Eyes: Negative for visual disturbance.  Respiratory: Negative for cough and shortness of breath.   Cardiovascular: Negative for chest pain, palpitations and leg swelling.  Gastrointestinal: Positive for abdominal pain. Negative for nausea, vomiting, diarrhea, constipation and blood in stool.  Endocrine: Negative for polydipsia, polyphagia and polyuria.  Musculoskeletal: Negative for myalgias, back pain, arthralgias, gait problem and neck pain.  Skin: Negative for rash.  Allergic/Immunologic: Negative for immunocompromised state.  Neurological: Negative for headaches.  Hematological: Negative for adenopathy. Does not bruise/bleed easily.  Psychiatric/Behavioral: Negative for suicidal ideas, sleep disturbance and dysphoric mood. The patient is not nervous/anxious.    Objective:  BP 132/89 mmHg  Pulse 109  Temp(Src) 98.1 F (36.7 C) (Oral)  Resp 18  Ht 5\' 9"  (1.753 m)  Wt   SpO2 94%  BP/Weight 05/05/2015 05/05/2015 04/10/2015  Systolic BP 132 128 132  Diastolic BP 89 96 87  Wt. (Lbs) - - 180  BMI - -  25.83  Some encounter information is confidential and restricted. Go to Review Flowsheets activity to see all data.    Physical Exam  Constitutional: He appears well-developed and well-nourished. No distress.  Patient smells of alcohol   HENT:  Head: Normocephalic and atraumatic.  Neck: Normal range of motion. Neck supple.  Cardiovascular: Normal rate, regular rhythm, normal heart sounds and intact distal pulses.   Pulmonary/Chest: Effort normal and breath sounds normal.  Abdominal:  Soft. Bowel sounds are normal. He exhibits no distension and no mass. There is tenderness. There is guarding. There is no rebound.  Musculoskeletal: He exhibits no edema.  Neurological: He is alert.  Skin: Skin is warm and dry. No rash noted. No erythema.  Psychiatric: He has a normal mood and affect.   CBG 169   Treated with zofran and GI cocktail  Assessment & Plan:   Problem List Items Addressed This Visit    ABDOMINAL PAIN-EPIGASTRIC   Relevant Medications   omeprazole (PRILOSEC) 20 MG capsule   ondansetron (ZOFRAN) tablet 8 mg (Completed)   gi cocktail (Maalox,Lidocaine,Donnatal) (Completed)   traMADol (ULTRAM) 50 MG tablet   Alcohol abuse - Primary   Chronic hepatitis C virus infection (HCC)   Relevant Orders   Ambulatory referral to Infectious Disease   Fall from chair    Fall from wheelchair. Slip suspected. Patient still intoxicated. No trauma sustained.        Other Visit Diagnoses    Weakness        Relevant Orders    Glucose (CBG) (Completed)       Meds ordered this encounter  Medications  . omeprazole (PRILOSEC) 20 MG capsule    Sig: Take 1 capsule (20 mg total) by mouth daily.    Dispense:  30 capsule    Refill:  0  . sucralfate (CARAFATE) 1 G tablet    Sig: Take 1 tablet (1 g total) by mouth 4 (four) times daily -  with meals and at bedtime.    Dispense:  60 tablet    Refill:  0  . ondansetron (ZOFRAN) tablet 8 mg    Sig:   . gi cocktail (Maalox,Lidocaine,Donnatal)    Sig:   . traMADol (ULTRAM) 50 MG tablet    Sig: Take 1 tablet (50 mg total) by mouth every 8 (eight) hours as needed for moderate pain.    Dispense:  30 tablet    Refill:  0    Follow-up: No Follow-up on file.   Dessa Phi MD

## 2015-05-05 NOTE — Patient Instructions (Addendum)
Chad Wolfe was seen today for abdominal pain.  Diagnoses and all orders for this visit:  Alcohol abuse  ABDOMINAL PAIN-EPIGASTRIC -     omeprazole (PRILOSEC) 20 MG capsule; Take 1 capsule (20 mg total) by mouth daily. -     sucralfate (CARAFATE) 1 G tablet; Take 1 tablet (1 g total) by mouth 4 (four) times daily -  with meals and at bedtime. -     ondansetron (ZOFRAN) tablet 8 mg; Take 2 tablets (8 mg total) by mouth once. -     gi cocktail (Maalox,Lidocaine,Donnatal); Take 30 mLs by mouth once. -     traMADol (ULTRAM) 50 MG tablet; Take 1 tablet (50 mg total) by mouth every 8 (eight) hours as needed for moderate pain.  Chronic hepatitis C without hepatic coma (HCC) -     Ambulatory referral to Infectious Disease   Alcohol abuse, counseling services available at [facility], [facility] [place].   F/u in 6 weeks   Dr. Armen PickupFunches

## 2015-05-05 NOTE — Progress Notes (Signed)
Patient complaining of abdominal pain, at level 10, described as sharp, achy and "it hurts real bad and won't go away". Patient in ED earlier and has been discharged from the ED. Patient reports drinking a quart of beer at 8 or 9am this morning. Patient can't eat because of pain. Ate chicken broth and chicken noodle soup yesterday.

## 2015-05-05 NOTE — ED Provider Notes (Signed)
CSN: [id number]     Arrival date & time 05/05/15  1209 History   First MD Initiated Contact with Patient 05/05/15 1304     Chief Complaint  Patient presents with  . Abdominal Pain    HPI  Lynnea FerrierWilliam C Ley is a 49 y.o. male with a PMH of HTN, HLD, gastritis, pancreatitis, alcohol abuse who presents to the ED with abdominal pain, which he states has been present x 1 month and is unchanged. He states he drank a fifth of liquor this morning. He reports associated nausea and one episode of emesis today. He states eating exacerbates his pain. He reports he drinks alcohol to ease his pain.     Past Medical History  Diagnosis Date  . Hypertension   . Hyperlipidemia   . Gastric ulcer   . Hepatitis C   . Substance abuse    Past Surgical History  Procedure Laterality Date  . Stomach surgery     Family History  Problem Relation Age of Onset  . Diabetes Father   . Hypertension Father   . Diabetes Sister   . Hypertension Sister   . Diabetes Brother   . Hypertension Brother    Social History  Substance Use Topics  . Smoking status: Current Some Day Smoker  . Smokeless tobacco: None  . Alcohol Use: No      Review of Systems  Constitutional: Negative for fever and chills.  Gastrointestinal: Positive for nausea, vomiting and abdominal pain. Negative for diarrhea, constipation and blood in stool.  All other systems reviewed and are negative.     Allergies  Ibuprofen and Other  Home Medications   Prior to Admission medications   Medication Sig Start Date End Date Taking? Authorizing Provider  lisinopril (PRINIVIL,ZESTRIL) 5 MG tablet Take 1 tablet (5 mg total) by mouth daily. 02/08/15  Yes Dan Adela LankFloyd, DO    BP 128/96 mmHg  Pulse 93  Temp(Src) 98.4 F (36.9 C) (Oral)  Resp 20  SpO2 93% Physical Exam  Constitutional: He is oriented to person, place, and time. No distress.  Chronically ill appearing male in no acute distress.  HENT:  Head: Normocephalic and atraumatic.   Right Ear: External ear normal.  Left Ear: External ear normal.  Nose: Nose normal.  Mouth/Throat: Uvula is midline, oropharynx is clear and moist and mucous membranes are normal.  Eyes: Conjunctivae, EOM and lids are normal. Pupils are equal, round, and reactive to light. Right eye exhibits no discharge. Left eye exhibits no discharge. No scleral icterus.  Neck: Normal range of motion. Neck supple.  Cardiovascular: Normal rate, regular rhythm, normal heart sounds, intact distal pulses and normal pulses.   Pulmonary/Chest: Effort normal and breath sounds normal. No respiratory distress. He has no wheezes. He has no rales.  Abdominal: Soft. Normal appearance and bowel sounds are normal. He exhibits no distension and no mass. There is tenderness. There is no rigidity, no rebound and no guarding.  TTP of epigastric region. No rebound, guarding, or masses.  Musculoskeletal: Normal range of motion. He exhibits no edema or tenderness.  Neurological: He is alert and oriented to person, place, and time. He has normal strength. No sensory deficit.  Patient ambulates without difficulty.  Skin: Skin is warm, dry and intact. No rash noted. He is not diaphoretic. No erythema. No pallor.  Psychiatric: He has a normal mood and affect. His speech is normal and behavior is normal.  Nursing note and vitals reviewed.   ED Course  Procedures (including  critical care time)  Labs Review Labs Reviewed  COMPREHENSIVE METABOLIC PANEL - Abnormal; Notable for the following:    CO2 20 (*)    Glucose, Bld 234 (*)    BUN <5 (*)    Calcium 8.8 (*)    AST 164 (*)    ALT 103 (*)    Total Bilirubin 1.8 (*)    All other components within normal limits  CBC - Abnormal; Notable for the following:    Hemoglobin 17.3 (*)    All other components within normal limits  LIPASE, BLOOD    Imaging Review No results found.   I have personally reviewed and evaluated these images and lab results as part of my medical  decision-making.   EKG Interpretation None      MDM   Final diagnoses:  Abdominal pain, unspecified abdominal location    49 year old male presents with abdominal pain, which he reports he has had for the past month. States he drank a fifth of liquor this morning. Reports associated nausea and vomiting.  Patient is afebrile. Heart rate 106. Heart regular rhythm. Lungs clear to auscultation bilaterally. Tenderness to palpation of epigastric region. No rebound, guarding, or masses. Patient moves all extremities and ambulates without difficulty.  CBC negative for leukocytosis, hemoglobin 17.3. CMP remarkable for AST 164, ALT 13, bilirubin 1.8. Lipase within normal limits. Patient given GI cocktail and zofran for symptoms. Patient reports significant symptom improvement s/p GI cocktail and zofran.  Transaminitis likely due to patient's chronic alcohol use. Patient is non-toxic, feel he is stable for discharge at this time. Spoke with patient at length regarding alcohol use and PCP follow-up, resource list give. Will discharge with prescription for PPI and carafate. Return precautions discussed. Patient verbalizes his understanding and is in agreement with plan.  BP 128/96 mmHg  Pulse 93  Temp(Src) 98.4 F (36.9 C) (Oral)  Resp 20  SpO2 93%       Mady Gemma, PA-C 05/05/15 2013  Mancel Bale, MD 05/05/15 2049

## 2015-05-05 NOTE — Progress Notes (Signed)
ASSESSMENT: Pt currently experiencing symptoms of both depression and alcohol abuse. [facility]; would benefit from psychoeducation and community resources(at next visit), as well as supportive counseling regarding coping with depression and alcohol abuse.  Stage of Change: contemplative  PLAN: 1. F/U with behavioral health consultant in one week 2. Psychiatric Medications: none. 3. Behavioral recommendation(s):   -Consider discussing options for alcohol treatment in one week -Consider reading educational material regarding coping with symptoms of anxiety and depression SUBJECTIVE: Pt. referred by Dr Armen PickupFunches for alcohol abuse Pt. reports the following symptoms/concerns: Pt states that he is thinking about getting help for his alcohol use, as "my body just won't let me do anything, I think it may be because I'm abusing the alcohol", says that he is able to afford to use more than his friends, and is thinking about applying for disability because he is always depressed.  Duration of problem: undetermined number of years Severity: severe  OBJECTIVE: Orientation & Cognition: Oriented x3. Thought processes normal and appropriate to situation. Mood: appropriate. Affect: appropriate Appearance: appropriate Risk of harm to self or others: no known (intentional) risk of harm to self or others Substance use: alcohol Assessments administered: PHQ9: 18/ GAD7: 17  Diagnosis: Alcohol abuse/ Depression CPT Code: F10.10/ F32.9 -------------------------------------------- Other(s) present in the room: male friend/roomate  Time spent with patient in exam room: 12 minutes

## 2015-05-05 NOTE — ED Notes (Signed)
Pt is in stable condition upon d/c and ambulates from ED. 

## 2015-05-07 ENCOUNTER — Emergency Department (HOSPITAL_COMMUNITY)
Admission: EM | Admit: 2015-05-07 | Discharge: 2015-05-07 | Disposition: A | Discharge status: Home / Self Care | Payer: Self-pay | Attending: Emergency Medicine | Admitting: Emergency Medicine

## 2015-05-07 ENCOUNTER — Emergency Department (HOSPITAL_COMMUNITY): Payer: Self-pay

## 2015-05-07 ENCOUNTER — Encounter (HOSPITAL_COMMUNITY): Payer: Self-pay | Admitting: Neurology

## 2015-05-07 DIAGNOSIS — F1010 Alcohol abuse, uncomplicated: Secondary | ICD-10-CM | POA: Insufficient documentation

## 2015-05-07 DIAGNOSIS — Z8639 Personal history of other endocrine, nutritional and metabolic disease: Secondary | ICD-10-CM | POA: Insufficient documentation

## 2015-05-07 DIAGNOSIS — R1013 Epigastric pain: Secondary | ICD-10-CM | POA: Insufficient documentation

## 2015-05-07 DIAGNOSIS — F17200 Nicotine dependence, unspecified, uncomplicated: Secondary | ICD-10-CM | POA: Insufficient documentation

## 2015-05-07 DIAGNOSIS — Z8619 Personal history of other infectious and parasitic diseases: Secondary | ICD-10-CM | POA: Insufficient documentation

## 2015-05-07 DIAGNOSIS — Z79899 Other long term (current) drug therapy: Secondary | ICD-10-CM | POA: Insufficient documentation

## 2015-05-07 DIAGNOSIS — I10 Essential (primary) hypertension: Secondary | ICD-10-CM | POA: Insufficient documentation

## 2015-05-07 LAB — COMPREHENSIVE METABOLIC PANEL
ALBUMIN: 4 g/dL (ref 3.5–5.0)
ALK PHOS: 82 U/L (ref 38–126)
ALT: 100 U/L — ABNORMAL HIGH (ref 17–63)
ANION GAP: 11 (ref 5–15)
AST: 134 U/L — AB (ref 15–41)
CALCIUM: 9 mg/dL (ref 8.9–10.3)
CO2: 27 mmol/L (ref 22–32)
Chloride: 101 mmol/L (ref 101–111)
Creatinine, Ser: 0.87 mg/dL (ref 0.61–1.24)
GFR calc Af Amer: >60 mL/min (ref >60)
GFR calc non Af Amer: >60 mL/min (ref >60)
GLUCOSE: 164 mg/dL — AB (ref 65–99)
Potassium: 3.8 mmol/L (ref 3.5–5.1)
SODIUM: 139 mmol/L (ref 135–145)
Total Bilirubin: 1.1 mg/dL (ref 0.3–1.2)
Total Protein: 7.4 g/dL (ref 6.5–8.1)

## 2015-05-07 LAB — URINALYSIS, ROUTINE W REFLEX MICROSCOPIC
Bilirubin Urine: NEGATIVE
GLUCOSE, UA: 250 mg/dL — AB
HGB URINE DIPSTICK: NEGATIVE
KETONES UR: 15 mg/dL — AB
LEUKOCYTES UA: NEGATIVE
Nitrite: NEGATIVE
PROTEIN: NEGATIVE mg/dL
Specific Gravity, Urine: 1.015 (ref 1.005–1.030)
pH: 5 (ref 5.0–8.0)

## 2015-05-07 LAB — CBC
HEMATOCRIT: 48.4 % (ref 39.0–52.0)
HEMOGLOBIN: 16.7 g/dL (ref 13.0–17.0)
MCH: 33.3 pg (ref 26.0–34.0)
MCHC: 34.5 g/dL (ref 30.0–36.0)
MCV: 96.6 fL (ref 78.0–100.0)
Platelets: 215 10*3/uL (ref 150–400)
RBC: 5.01 MIL/uL (ref 4.22–5.81)
RDW: 12.3 % (ref 11.5–15.5)
WBC: 7.6 10*3/uL (ref 4.0–10.5)

## 2015-05-07 LAB — LIPASE, BLOOD: Lipase: 24 U/L (ref 11–51)

## 2015-05-07 MED ORDER — SODIUM CHLORIDE 0.9 % IV BOLUS (SEPSIS)
1000.0000 mL | Freq: Once | INTRAVENOUS | Status: AC
  Administered 2015-05-07: 1000 mL via INTRAVENOUS

## 2015-05-07 MED ORDER — MORPHINE SULFATE (PF) 4 MG/ML IV SOLN
4.0000 mg | Freq: Once | INTRAVENOUS | Status: AC
  Administered 2015-05-07: 4 mg via INTRAVENOUS
  Filled 2015-05-07: qty 1

## 2015-05-07 MED ORDER — GI COCKTAIL ~~LOC~~
30.0000 mL | Freq: Once | ORAL | Status: AC
  Administered 2015-05-07: 30 mL via ORAL
  Filled 2015-05-07: qty 30

## 2015-05-07 MED ORDER — HYDROMORPHONE HCL 1 MG/ML IJ SOLN
1.0000 mg | Freq: Once | INTRAMUSCULAR | Status: AC
  Administered 2015-05-07: 1 mg via INTRAVENOUS
  Filled 2015-05-07: qty 1

## 2015-05-07 MED ORDER — ONDANSETRON 4 MG PO TBDP: 4.0000 mg | ORAL_TABLET | Freq: Three times a day (TID) | ORAL | Status: DC | PRN

## 2015-05-07 MED ORDER — ONDANSETRON HCL 4 MG/2ML IJ SOLN
4.0000 mg | Freq: Once | INTRAMUSCULAR | Status: AC
  Administered 2015-05-07: 4 mg via INTRAVENOUS
  Filled 2015-05-07: qty 2

## 2015-05-07 MED ORDER — PANTOPRAZOLE SODIUM 20 MG PO TBEC: 20.0000 mg | DELAYED_RELEASE_TABLET | Freq: Every day | ORAL | Status: DC

## 2015-05-07 MED ORDER — IOHEXOL 300 MG/ML  SOLN
100.0000 mL | Freq: Once | INTRAMUSCULAR | Status: AC | PRN
  Administered 2015-05-07: 100 mL via INTRAVENOUS

## 2015-05-07 NOTE — ED Notes (Signed)
Pt in CT during round

## 2015-05-07 NOTE — ED Provider Notes (Signed)
CSN: [id number]     Arrival date & time 05/07/15  4098 History   First MD Initiated Contact with Patient 05/07/15 1046     Chief Complaint  Patient presents with  . Abdominal Pain     (Consider location/radiation/quality/duration/timing/severity/associated sxs/prior Treatment) HPI   Patient is a 49 year old male with past medical history of hypertension, pancreatitis, hepatitis C and alcohol abuse who presents to the ED with complaint of upper abdominal pain. Patient reports he has had abdominal pain for the past 6 months but notes his pain worsened over the last week. He reports having a constant burning/sharp pain to the epigastric region, aggravated with eating or drinking anything. Patient reports that he has been forcing himself to vomit because it improves his pain. Denies fever, chills, cough, SOB, CP, hematemesis, diarrhea, constipation, urinary symptoms, blood in urine or stool. Patient reports drinking a glass of wine and a 24 ounce beer yesterday. He states he only drinks alcohol occasionally but when he does he "drinks a lot". Patient was seen in the ED on 11/28 for similar symptoms, given GI cocktail and Zofran with improvement, discharged home with PPI and Carafate. Patient reports he has been taking the medications but denies any relief. He notes he has no point and scheduled with his PCP on 05/09/15.  Past Medical History  Diagnosis Date  . Hypertension   . Hyperlipidemia   . Gastric ulcer   . Hepatitis C   . Substance abuse    Past Surgical History  Procedure Laterality Date  . Stomach surgery     Family History  Problem Relation Age of Onset  . Diabetes Father   . Hypertension Father   . Diabetes Sister   . Hypertension Sister   . Diabetes Brother   . Hypertension Brother    Social History  Substance Use Topics  . Smoking status: Current Some Day Smoker  . Smokeless tobacco: None  . Alcohol Use: 0.0 oz/week    0 Standard drinks or equivalent per week   Comment: drank a quart of beer this morning    Review of Systems  Gastrointestinal: Positive for nausea, vomiting and abdominal pain.  All other systems reviewed and are negative.     Allergies  Ibuprofen and Other  Home Medications   Prior to Admission medications   Medication Sig Start Date End Date Taking? Authorizing Provider  lisinopril (PRINIVIL,ZESTRIL) 5 MG tablet Take 1 tablet (5 mg total) by mouth daily. 02/08/15  Yes Melene Plan, DO  omeprazole (PRILOSEC) 20 MG capsule Take 1 capsule (20 mg total) by mouth daily. 05/05/15  Yes Josalyn Funches, MD  sucralfate (CARAFATE) 1 G tablet Take 1 tablet (1 g total) by mouth 4 (four) times daily -  with meals and at bedtime. 05/05/15  Yes Josalyn Funches, MD  traMADol (ULTRAM) 50 MG tablet Take 1 tablet (50 mg total) by mouth every 8 (eight) hours as needed for moderate pain. 05/05/15  Yes Josalyn Funches, MD  ondansetron (ZOFRAN ODT) 4 MG disintegrating tablet Take 1 tablet (4 mg total) by mouth every 8 (eight) hours as needed for nausea or vomiting. 05/07/15   Barrett Henle, PA-C  pantoprazole (PROTONIX) 20 MG tablet Take 1 tablet (20 mg total) by mouth daily. 05/07/15   Satira Sark Lilli Dewald, PA-C   BP 168/97 mmHg  Pulse 48  Temp(Src) 98.7 F (37.1 C) (Oral)  Resp 18  SpO2 95% Physical Exam  Constitutional: He is oriented to person, place, and time. He appears well-developed  and well-nourished.  Pt appears to be in mild discomfort.  HENT:  Head: Normocephalic and atraumatic.  Mouth/Throat: Oropharynx is clear and moist. No oropharyngeal exudate.  Eyes: Conjunctivae and EOM are normal. Right eye exhibits no discharge. Left eye exhibits no discharge. No scleral icterus.  Neck: Normal range of motion. Neck supple.  Cardiovascular: Normal rate, regular rhythm, normal heart sounds and intact distal pulses.   Pulmonary/Chest: Effort normal and breath sounds normal. No respiratory distress. He has no wheezes. He has no  rales. He exhibits no tenderness.  Abdominal: Soft. Bowel sounds are normal. He exhibits no distension and no mass. There is tenderness in the epigastric area. There is no rigidity, no rebound, no guarding and negative Murphy's sign.  Musculoskeletal: Normal range of motion. He exhibits no edema.  Lymphadenopathy:    He has no cervical adenopathy.  Neurological: He is alert and oriented to person, place, and time.  Skin: Skin is warm and dry.  Nursing note and vitals reviewed.   ED Course  Procedures (including critical care time) Labs Review Labs Reviewed  COMPREHENSIVE METABOLIC PANEL - Abnormal; Notable for the following:    Glucose, Bld 164 (*)    BUN <5 (*)    AST 134 (*)    ALT 100 (*)    All other components within normal limits  LIPASE, BLOOD  CBC  URINALYSIS, ROUTINE W REFLEX MICROSCOPIC (NOT AT [facility])    Imaging Review Ct Abdomen Pelvis W Contrast  05/07/2015  CLINICAL DATA:  Epigastric pain, abdominal pain, vomiting for 4 days. EXAM: CT ABDOMEN AND PELVIS WITH CONTRAST TECHNIQUE: Multidetector CT imaging of the abdomen and pelvis was performed using the standard protocol following bolus administration of intravenous contrast. CONTRAST:  100mL OMNIPAQUE IOHEXOL 300 MG/ML  SOLN COMPARISON:  None. FINDINGS: Study limited by the delayed phase of contrast on imaging, patient had projectile vomiting during initial injection filling acquisition of images. Liver is low in density suggesting fatty infiltrate. No focal mass or lesion seen within the liver. Gallbladder appears normal. No bile duct dilatation. Pancreas appears normal. No peripancreatic fluid or inflammation. Spleen appears normal. Adrenal glands appear normal. Kidneys are unremarkable without stone or hydronephrosis. No obstructing ureteral stones seen. Bladder is unremarkable. Bowel is normal in caliber. Scattered diverticulosis noted within the descending and sigmoid colon without evidence of acute diverticulitis.  Appendix is normal. Stomach is unremarkable. There is questionable thickening of the walls of the distal esophagus. No free fluid or abscess collection. No free intraperitoneal air. Scattered mild atherosclerotic changes noted along the walls of the normal-caliber abdominal aorta. Lung bases are clear. Mild degenerative change noted within the lumbar spine but no acute osseous abnormality. Superficial soft tissues are unremarkable. IMPRESSION: 1. Fatty infiltration of the liver. No focal mass or lesion within the liver. Gallbladder appears normal. No bile duct dilatation. 2. Colonic diverticulosis without evidence of acute diverticulitis. 3. Questionable thickening of the walls of the distal esophagus. Esophagitis? Sequela of chronic reflux? 4. Remainder of the abdomen and pelvis CT examination is unremarkable. No free fluid. No bowel obstruction or evidence of bowel wall inflammation. No evidence of acute solid organ abnormality. 5. Study limitations detailed above. Electronically Signed   By: Bary RichardStan  Maynard M.D.   On: 05/07/2015 15:23   I have personally reviewed and evaluated these images and lab results as part of my medical decision-making.  Filed Vitals:   05/07/15 1400 05/07/15 1430  BP: 157/101 168/97  Pulse: 65 48  Temp:  Resp:       MDM   Final diagnoses:  Epigastric pain  ETOH abuse    Patient presents with abdominal pain that he has had for the past 6 months but notes it has worsened over the past week. Endorses drinking EtOH yesterday. Reports that his pain is alleviated with vomiting, pain worse with eating or drinking. Patient was seen in the ED 2 days ago for similar pain, labs at baseline, given GI cocktail and Zofran with improvement, discharged home with PPI and Carafate. VSS. Exam reveals epigastric tenderness, no peritoneal signs.  Patient given IV fluids, GI cocktail, Zofran and pain meds. Labs at patient's baseline. Patient requesting something to drink. Patient able to  tolerate by mouth and reports pain has improved. CT revealed questionable thickening of walls of distal esophagus, possibly due to chronic reflux, no free air or fluid.   Presentation likely due to chronic EtOH use and reflux. Plan to d/c pt home with PPI and zofran. Advised pt to follow up with PCP, given follow up for GI. Discussed with pt at length regarding EtOH use. Evaluation does not show pathology requring ongoing emergent intervention or admission. Pt is hemodynamically stable and mentating appropriately. All questions answered. Return precautions discussed and outpatient follow up given.      Satira Sark [place], [place] 05/07/15 1550  Blane Ohara, MD 05/07/15 [phone]

## 2015-05-07 NOTE — ED Notes (Signed)
Patient transported to CT 

## 2015-05-07 NOTE — Discharge Instructions (Signed)
Take your medications as prescribed. Follow up with your primary care provider in 4-5 days. I also recommend following up with Gastroenterology regarding your chronic abdominal pain. Please return to the Emergency Department if symptoms worsen or new onset of fever, difficulty breathing, chest pain, vomiting, vomiting blood, blood in stool.

## 2015-05-07 NOTE — ED Notes (Signed)
Pt reports generalized abd pain for 6 months but reports got worse in the last week. Has been having n/v but is throwing up to help with pain. Denies diarrhea. Was told this week that he had pancreatitis. Pt is a x 4

## 2015-05-09 ENCOUNTER — Ambulatory Visit: Payer: Self-pay | Admitting: Internal Medicine

## 2015-05-09 ENCOUNTER — Emergency Department (HOSPITAL_COMMUNITY)
Admission: EM | Admit: 2015-05-09 | Discharge: 2015-05-09 | Disposition: A | Discharge status: Home / Self Care | Payer: Self-pay | Attending: Emergency Medicine | Admitting: Emergency Medicine

## 2015-05-09 DIAGNOSIS — Z8639 Personal history of other endocrine, nutritional and metabolic disease: Secondary | ICD-10-CM | POA: Insufficient documentation

## 2015-05-09 DIAGNOSIS — K2920 Alcoholic gastritis without bleeding: Secondary | ICD-10-CM | POA: Insufficient documentation

## 2015-05-09 DIAGNOSIS — R1013 Epigastric pain: Secondary | ICD-10-CM

## 2015-05-09 DIAGNOSIS — F1020 Alcohol dependence, uncomplicated: Secondary | ICD-10-CM | POA: Insufficient documentation

## 2015-05-09 DIAGNOSIS — Z8619 Personal history of other infectious and parasitic diseases: Secondary | ICD-10-CM | POA: Insufficient documentation

## 2015-05-09 DIAGNOSIS — I10 Essential (primary) hypertension: Secondary | ICD-10-CM | POA: Insufficient documentation

## 2015-05-09 DIAGNOSIS — F17200 Nicotine dependence, unspecified, uncomplicated: Secondary | ICD-10-CM | POA: Insufficient documentation

## 2015-05-09 DIAGNOSIS — Z79899 Other long term (current) drug therapy: Secondary | ICD-10-CM | POA: Insufficient documentation

## 2015-05-09 LAB — COMPREHENSIVE METABOLIC PANEL
ALK PHOS: 89 U/L (ref 38–126)
ALT: 94 U/L — AB (ref 17–63)
AST: 125 U/L — ABNORMAL HIGH (ref 15–41)
Albumin: 4.2 g/dL (ref 3.5–5.0)
Anion gap: 14 (ref 5–15)
BUN: <5 mg/dL — ABNORMAL LOW (ref 6–20)
CALCIUM: 9.3 mg/dL (ref 8.9–10.3)
CO2: 25 mmol/L (ref 22–32)
CREATININE: 0.97 mg/dL (ref 0.61–1.24)
Chloride: 100 mmol/L — ABNORMAL LOW (ref 101–111)
Glucose, Bld: 194 mg/dL — ABNORMAL HIGH (ref 65–99)
Potassium: 4 mmol/L (ref 3.5–5.1)
Sodium: 139 mmol/L (ref 135–145)
Total Bilirubin: 1.8 mg/dL — ABNORMAL HIGH (ref 0.3–1.2)
Total Protein: 8.4 g/dL — ABNORMAL HIGH (ref 6.5–8.1)

## 2015-05-09 LAB — CBC WITH DIFFERENTIAL/PLATELET
BASOS PCT: 0 %
Basophils Absolute: 0 10*3/uL (ref 0.0–0.1)
EOS ABS: 0 10*3/uL (ref 0.0–0.7)
Eosinophils Relative: 0 %
HCT: 53 % — ABNORMAL HIGH (ref 39.0–52.0)
HEMOGLOBIN: 19 g/dL — AB (ref 13.0–17.0)
Lymphocytes Relative: 27 %
Lymphs Abs: 2 10*3/uL (ref 0.7–4.0)
MCH: 34.2 pg — ABNORMAL HIGH (ref 26.0–34.0)
MCHC: 35.8 g/dL (ref 30.0–36.0)
MCV: 95.3 fL (ref 78.0–100.0)
Monocytes Absolute: 0.5 10*3/uL (ref 0.1–1.0)
Monocytes Relative: 7 %
NEUTROS PCT: 66 %
Neutro Abs: 4.8 10*3/uL (ref 1.7–7.7)
Platelets: 209 10*3/uL (ref 150–400)
RBC: 5.56 MIL/uL (ref 4.22–5.81)
RDW: 12.2 % (ref 11.5–15.5)
WBC: 7.3 10*3/uL (ref 4.0–10.5)

## 2015-05-09 LAB — ETHANOL: ALCOHOL ETHYL (B): 114 mg/dL — AB (ref <5)

## 2015-05-09 LAB — LIPASE, BLOOD: LIPASE: 30 U/L (ref 11–51)

## 2015-05-09 MED ORDER — SODIUM CHLORIDE 0.9 % IV BOLUS (SEPSIS)
1000.0000 mL | Freq: Once | INTRAVENOUS | Status: AC
  Administered 2015-05-09: 1000 mL via INTRAVENOUS

## 2015-05-09 MED ORDER — ONDANSETRON HCL 4 MG/2ML IJ SOLN
4.0000 mg | Freq: Once | INTRAMUSCULAR | Status: AC
  Administered 2015-05-09: 4 mg via INTRAVENOUS
  Filled 2015-05-09: qty 2

## 2015-05-09 MED ORDER — GI COCKTAIL ~~LOC~~
30.0000 mL | Freq: Once | ORAL | Status: AC
  Administered 2015-05-09: 30 mL via ORAL
  Filled 2015-05-09: qty 30

## 2015-05-09 MED ORDER — OXYCODONE-ACETAMINOPHEN 5-325 MG PO TABS: 1.0000 | ORAL_TABLET | Freq: Four times a day (QID) | ORAL | Status: DC | PRN

## 2015-05-09 MED ORDER — MORPHINE SULFATE (PF) 4 MG/ML IV SOLN
4.0000 mg | Freq: Once | INTRAVENOUS | Status: AC
  Administered 2015-05-09: 4 mg via INTRAVENOUS
  Filled 2015-05-09: qty 1

## 2015-05-09 NOTE — ED Notes (Signed)
Patient to ED with C/O abdominal pain.  States that he was seen here 2 days ago.  He brought his D/C instructions and the prescriptions he was given with him.  Patient states that he was drinking since then.  Now C/O abdominal pain. Indicates that the pain is epigastric.  Area is tender to palpation.   C/O nausea and vomiting.

## 2015-05-09 NOTE — Discharge Instructions (Signed)
Avoid alcohol consumption.  Continue taking your Prilosec and Carafate as prescribed.  Percocet as prescribed as needed for pain.  [facility] to discuss a possible endoscopy. The contact information has been provided for you to call and arrange this appointment.   Gastritis, Adult Gastritis is soreness and swelling (inflammation) of the lining of the stomach. Gastritis can develop as a sudden onset (acute) or long-term (chronic) condition. If gastritis is not treated, it can lead to stomach bleeding and ulcers. CAUSES  Gastritis occurs when the stomach lining is weak or damaged. Digestive juices from the stomach then inflame the weakened stomach lining. The stomach lining may be weak or damaged due to viral or bacterial infections. One common bacterial infection is the Helicobacter pylori infection. Gastritis can also result from excessive alcohol consumption, taking certain medicines, or having too much acid in the stomach.  SYMPTOMS  In some cases, there are no symptoms. When symptoms are present, they may include:  Pain or a burning sensation in the upper abdomen.  Nausea.  Vomiting.  An uncomfortable feeling of fullness after eating. DIAGNOSIS  Your caregiver may suspect you have gastritis based on your symptoms and a physical exam. To determine the cause of your gastritis, your caregiver may perform the following:  Blood or stool tests to check for the H pylori bacterium.  Gastroscopy. A thin, flexible tube (endoscope) is passed down the esophagus and into the stomach. The endoscope has a light and camera on the end. Your caregiver uses the endoscope to view the inside of the stomach.  Taking a tissue sample (biopsy) from the stomach to examine under a microscope. TREATMENT  Depending on the cause of your gastritis, medicines may be prescribed. If you have a bacterial infection, such as an H pylori infection, antibiotics may be given. If your gastritis  is caused by too much acid in the stomach, H2 blockers or antacids may be given. Your caregiver may recommend that you stop taking aspirin, ibuprofen, or other nonsteroidal anti-inflammatory drugs (NSAIDs). HOME CARE INSTRUCTIONS  Only take over-the-counter or prescription medicines as directed by your caregiver.  If you were given antibiotic medicines, take them as directed. Finish them even if you start to feel better.  Drink enough fluids to keep your urine clear or pale yellow.  Avoid foods and drinks that make your symptoms worse, such as:  Caffeine or alcoholic drinks.  Chocolate.  Peppermint or mint flavorings.  Garlic and onions.  Spicy foods.  Citrus fruits, such as oranges, lemons, or limes.  Tomato-based foods such as sauce, chili, salsa, and pizza.  Fried and fatty foods.  Eat small, frequent meals instead of large meals. SEEK IMMEDIATE MEDICAL CARE IF:   You have black or dark red stools.  You vomit blood or material that looks like coffee grounds.  You are unable to keep fluids down.  Your abdominal pain gets worse.  You have a fever.  You do not feel better after 1 week.  You have any other questions or concerns. MAKE SURE YOU:  Understand these instructions.  Will watch your condition.  Will get help right away if you are not doing well or get worse.   This information is not intended to replace advice given to you by your health care provider. Make sure you discuss any questions you have with your health care provider.   Document Released: 05/18/2001 Document Revised: 11/23/2011 Document Reviewed: 07/07/2011 Elsevier Interactive Patient Education [employer]

## 2015-05-09 NOTE — ED Notes (Signed)
PT reports belly pain x 2 days; also wants help quitting drinking; belly is rounded and obese. Hx of ETOH abuse.

## 2015-05-09 NOTE — ED Provider Notes (Addendum)
CSN: [id number]     Arrival date & time 05/09/15  1236 History   First MD Initiated Contact with Patient 05/09/15 1239     Chief Complaint  Patient presents with  . Abdominal Pain     (Consider location/radiation/quality/duration/timing/severity/associated sxs/prior Treatment) HPI Comments: Patient is a 49 year old male with history of chronic alcoholism and peptic ulcers. He presents for evaluation of severe epigastric pain that is an ongoing for the past week. He was here 2 days ago and had a workup including CT scan and laboratory studies. These showed mild elevations of his LFTs thought to be related to his alcoholism. There is no evidence for pancreatitis And or laboratory work.  Patient is a 49 y.o. male presenting with abdominal pain. The history is provided by the patient.  Abdominal Pain Pain location:  Epigastric Pain quality: stabbing   Pain radiates to:  Does not radiate Pain severity:  Severe Duration:  1 week Timing:  Constant Progression:  Worsening Chronicity:  Recurrent Context: alcohol use   Relieved by:  Nothing Worsened by:  Nothing tried   Past Medical History  Diagnosis Date  . Hypertension   . Hyperlipidemia   . Gastric ulcer   . Hepatitis C   . Substance abuse    Past Surgical History  Procedure Laterality Date  . Stomach surgery     Family History  Problem Relation Age of Onset  . Diabetes Father   . Hypertension Father   . Diabetes Sister   . Hypertension Sister   . Diabetes Brother   . Hypertension Brother    Social History  Substance Use Topics  . Smoking status: Current Some Day Smoker  . Smokeless tobacco: Not on file  . Alcohol Use: 0.0 oz/week    0 Standard drinks or equivalent per week     Comment: drank a quart of beer this morning    Review of Systems  Gastrointestinal: Positive for abdominal pain.      Allergies  Ibuprofen and Other  Home Medications   Prior to Admission medications   Medication Sig Start Date  End Date Taking? Authorizing Provider  lisinopril (PRINIVIL,ZESTRIL) 5 MG tablet Take 1 tablet (5 mg total) by mouth daily. 02/08/15   Melene Plan, DO  omeprazole (PRILOSEC) 20 MG capsule Take 1 capsule (20 mg total) by mouth daily. 05/05/15   Josalyn Funches, MD  ondansetron (ZOFRAN ODT) 4 MG disintegrating tablet Take 1 tablet (4 mg total) by mouth every 8 (eight) hours as needed for nausea or vomiting. 05/07/15   Barrett Henle, PA-C  pantoprazole (PROTONIX) 20 MG tablet Take 1 tablet (20 mg total) by mouth daily. 05/07/15   Barrett Henle, PA-C  sucralfate (CARAFATE) 1 G tablet Take 1 tablet (1 g total) by mouth 4 (four) times daily -  with meals and at bedtime. 05/05/15   Josalyn Funches, MD  traMADol (ULTRAM) 50 MG tablet Take 1 tablet (50 mg total) by mouth every 8 (eight) hours as needed for moderate pain. 05/05/15   Josalyn Funches, MD   BP 152/108 mmHg  Pulse 93  Temp(Src) 98.5 F (36.9 C) (Oral)  Resp 15  SpO2 94% Physical Exam  Constitutional: He is oriented to person, place, and time. He appears well-developed and well-nourished. No distress.  HENT:  Head: Normocephalic and atraumatic.  Mouth/Throat: Oropharynx is clear and moist.  Neck: Normal range of motion. Neck supple.  Cardiovascular: Normal rate, regular rhythm and normal heart sounds.   No murmur heard. Pulmonary/Chest:  Effort normal and breath sounds normal. No respiratory distress. He has no wheezes.  Abdominal: Bowel sounds are normal. He exhibits no distension. There is tenderness.  There is tenderness to palpation in the epigastric region. There is no rebound or guarding.  Musculoskeletal: Normal range of motion.  Neurological: He is alert and oriented to person, place, and time.  Skin: Skin is warm and dry. He is not diaphoretic.  Nursing note and vitals reviewed.   ED Course  Procedures (including critical care time) Labs Review Labs Reviewed  COMPREHENSIVE METABOLIC PANEL  CBC WITH  DIFFERENTIAL/PLATELET  ETHANOL  LIPASE, BLOOD    Imaging Review Ct Abdomen Pelvis W Contrast  05/07/2015  CLINICAL DATA:  Epigastric pain, abdominal pain, vomiting for 4 days. EXAM: CT ABDOMEN AND PELVIS WITH CONTRAST TECHNIQUE: Multidetector CT imaging of the abdomen and pelvis was performed using the standard protocol following bolus administration of intravenous contrast. CONTRAST:  100mL OMNIPAQUE IOHEXOL 300 MG/ML  SOLN COMPARISON:  None. FINDINGS: Study limited by the delayed phase of contrast on imaging, patient had projectile vomiting during initial injection filling acquisition of images. Liver is low in density suggesting fatty infiltrate. No focal mass or lesion seen within the liver. Gallbladder appears normal. No bile duct dilatation. Pancreas appears normal. No peripancreatic fluid or inflammation. Spleen appears normal. Adrenal glands appear normal. Kidneys are unremarkable without stone or hydronephrosis. No obstructing ureteral stones seen. Bladder is unremarkable. Bowel is normal in caliber. Scattered diverticulosis noted within the descending and sigmoid colon without evidence of acute diverticulitis. Appendix is normal. Stomach is unremarkable. There is questionable thickening of the walls of the distal esophagus. No free fluid or abscess collection. No free intraperitoneal air. Scattered mild atherosclerotic changes noted along the walls of the normal-caliber abdominal aorta. Lung bases are clear. Mild degenerative change noted within the lumbar spine but no acute osseous abnormality. Superficial soft tissues are unremarkable. IMPRESSION: 1. Fatty infiltration of the liver. No focal mass or lesion within the liver. Gallbladder appears normal. No bile duct dilatation. 2. Colonic diverticulosis without evidence of acute diverticulitis. 3. Questionable thickening of the walls of the distal esophagus. Esophagitis? Sequela of chronic reflux? 4. Remainder of the abdomen and pelvis CT  examination is unremarkable. No free fluid. No bowel obstruction or evidence of bowel wall inflammation. No evidence of acute solid organ abnormality. 5. Study limitations detailed above. Electronically Signed   By: Bary RichardStan  Maynard M.D.   On: 05/07/2015 15:23   I have personally reviewed and evaluated these images and lab results as part of my medical decision-making.   EKG Interpretation None      MDM   Final diagnoses:  None    Patient presents here complaining of severe epigastric abdominal pain. He has a history of gastritis related to alcohol consumption. He was seen here 2 days ago and had a CT scan and laboratory studies which were essentially unremarkable. He has a continued elevation of his transaminases, however laboratory studies are otherwise unremarkable. His lipase is normal and I highly doubt pancreatitis. He was given pain medication and IV fluids along with a GI cocktail and appears to be feeling somewhat better.  I see nothing acute that requires admission. He has been having these symptoms for years and nothing appears different today. He will be given Percocet for his pain and advised to continue taking his omeprazole and Carafate as previously prescribed.    Geoffery Lyonsouglas Gracelee Stemmler, MD 05/09/15 1457  Geoffery Lyonsouglas Parrish Bonn, MD 06/03/15 [phone]

## 2015-05-10 ENCOUNTER — Encounter (HOSPITAL_COMMUNITY): Payer: Self-pay | Admitting: Emergency Medicine

## 2015-05-10 ENCOUNTER — Emergency Department (HOSPITAL_COMMUNITY)
Admission: EM | Admit: 2015-05-10 | Discharge: 2015-05-10 | Disposition: A | Discharge status: Home / Self Care | Payer: Self-pay | Attending: Emergency Medicine | Admitting: Emergency Medicine

## 2015-05-10 DIAGNOSIS — Z8639 Personal history of other endocrine, nutritional and metabolic disease: Secondary | ICD-10-CM | POA: Insufficient documentation

## 2015-05-10 DIAGNOSIS — F17200 Nicotine dependence, unspecified, uncomplicated: Secondary | ICD-10-CM | POA: Insufficient documentation

## 2015-05-10 DIAGNOSIS — Z8619 Personal history of other infectious and parasitic diseases: Secondary | ICD-10-CM | POA: Insufficient documentation

## 2015-05-10 DIAGNOSIS — I10 Essential (primary) hypertension: Secondary | ICD-10-CM | POA: Insufficient documentation

## 2015-05-10 DIAGNOSIS — F10129 Alcohol abuse with intoxication, unspecified: Secondary | ICD-10-CM | POA: Insufficient documentation

## 2015-05-10 DIAGNOSIS — K2920 Alcoholic gastritis without bleeding: Secondary | ICD-10-CM | POA: Insufficient documentation

## 2015-05-10 DIAGNOSIS — Z79899 Other long term (current) drug therapy: Secondary | ICD-10-CM | POA: Insufficient documentation

## 2015-05-10 DIAGNOSIS — R1013 Epigastric pain: Secondary | ICD-10-CM

## 2015-05-10 DIAGNOSIS — F1010 Alcohol abuse, uncomplicated: Secondary | ICD-10-CM

## 2015-05-10 LAB — CBC WITH DIFFERENTIAL/PLATELET
Basophils Absolute: 0 10*3/uL (ref 0.0–0.1)
Basophils Relative: 0 %
EOS ABS: 0 10*3/uL (ref 0.0–0.7)
EOS PCT: 0 %
HCT: 47.7 % (ref 39.0–52.0)
HEMOGLOBIN: 16.9 g/dL (ref 13.0–17.0)
LYMPHS ABS: 1.5 10*3/uL (ref 0.7–4.0)
LYMPHS PCT: 19 %
MCH: 33.8 pg (ref 26.0–34.0)
MCHC: 35.4 g/dL (ref 30.0–36.0)
MCV: 95.4 fL (ref 78.0–100.0)
MONO ABS: 0.6 10*3/uL (ref 0.1–1.0)
Monocytes Relative: 7 %
Neutro Abs: 6 10*3/uL (ref 1.7–7.7)
Neutrophils Relative %: 74 %
Platelets: 207 10*3/uL (ref 150–400)
RBC: 5 MIL/uL (ref 4.22–5.81)
RDW: 12 % (ref 11.5–15.5)
WBC: 8.1 10*3/uL (ref 4.0–10.5)

## 2015-05-10 LAB — COMPREHENSIVE METABOLIC PANEL
ALBUMIN: 4.1 g/dL (ref 3.5–5.0)
ALK PHOS: 80 U/L (ref 38–126)
ALT: 75 U/L — ABNORMAL HIGH (ref 17–63)
ANION GAP: 11 (ref 5–15)
AST: 91 U/L — AB (ref 15–41)
CALCIUM: 8.4 mg/dL — AB (ref 8.9–10.3)
CO2: 25 mmol/L (ref 22–32)
Chloride: 102 mmol/L (ref 101–111)
Creatinine, Ser: 0.84 mg/dL (ref 0.61–1.24)
GFR calc Af Amer: >60 mL/min (ref >60)
GFR calc non Af Amer: >60 mL/min (ref >60)
GLUCOSE: 219 mg/dL — AB (ref 65–99)
Potassium: 3.7 mmol/L (ref 3.5–5.1)
SODIUM: 138 mmol/L (ref 135–145)
Total Bilirubin: 1.1 mg/dL (ref 0.3–1.2)
Total Protein: 7.9 g/dL (ref 6.5–8.1)

## 2015-05-10 LAB — LIPASE, BLOOD: Lipase: 26 U/L (ref 11–51)

## 2015-05-10 LAB — ETHANOL: Alcohol, Ethyl (B): 381 mg/dL (ref <5)

## 2015-05-10 MED ORDER — ONDANSETRON HCL 4 MG/2ML IJ SOLN
4.0000 mg | Freq: Once | INTRAMUSCULAR | Status: AC
  Administered 2015-05-10: 4 mg via INTRAVENOUS
  Filled 2015-05-10: qty 2

## 2015-05-10 MED ORDER — PANTOPRAZOLE SODIUM 20 MG PO TBEC: 20.0000 mg | DELAYED_RELEASE_TABLET | Freq: Every day | ORAL | Status: DC

## 2015-05-10 MED ORDER — MORPHINE SULFATE (PF) 4 MG/ML IV SOLN
4.0000 mg | Freq: Once | INTRAVENOUS | Status: AC
Start: 2015-05-10 — End: 2015-05-10
  Administered 2015-05-10: 4 mg via INTRAVENOUS
  Filled 2015-05-10: qty 1

## 2015-05-10 MED ORDER — PANTOPRAZOLE SODIUM 40 MG IV SOLR
40.0000 mg | Freq: Once | INTRAVENOUS | Status: AC
  Administered 2015-05-10: 40 mg via INTRAVENOUS
  Filled 2015-05-10: qty 40

## 2015-05-10 MED ORDER — FAMOTIDINE IN NACL 20-0.9 MG/50ML-% IV SOLN
20.0000 mg | Freq: Once | INTRAVENOUS | Status: AC
  Administered 2015-05-10: 20 mg via INTRAVENOUS
  Filled 2015-05-10: qty 50

## 2015-05-10 MED ORDER — SODIUM CHLORIDE 0.9 % IV BOLUS (SEPSIS)
1000.0000 mL | Freq: Once | INTRAVENOUS | Status: AC
  Administered 2015-05-10: 1000 mL via INTRAVENOUS

## 2015-05-10 MED ORDER — SUCRALFATE 1 G PO TABS: 1.0000 g | ORAL_TABLET | Freq: Three times a day (TID) | ORAL | Status: DC

## 2015-05-10 MED ORDER — GI COCKTAIL ~~LOC~~
30.0000 mL | Freq: Once | ORAL | Status: AC
  Administered 2015-05-10: 30 mL via ORAL
  Filled 2015-05-10: qty 30

## 2015-05-10 NOTE — Clinical Social Work Note (Signed)
CSW met with pt and his family to provide resources for substance abuse.  Pt's family stated that they would call and take pt to the listed resources on Monday during intake hours which were provided in resources  .Dede Query, LCSW [facility] Clinical Social Worker - Weekend Coverage cell #: [phone]

## 2015-05-10 NOTE — ED Notes (Signed)
MD Silverio LayYao speaking with patient currently

## 2015-05-10 NOTE — ED Provider Notes (Signed)
CSN: [id number]     Arrival date & time 05/10/15  1259 History   First MD Initiated Contact with Patient 05/10/15 1322     Chief Complaint  Patient presents with  . Abdominal Pain     (Consider location/radiation/quality/duration/timing/severity/associated sxs/prior Treatment) The history is provided by the patient.  Chad Wolfe is a 49 y.o. male hx HTN, HL, gastric ulcer, substance abuse, here presenting with epigastric pain, alcohol intoxication. [facility]. He also went several days ago for exact same complaint and had CT abdomen pelvis that was unremarkable. He has been self-medicating with alcohol and  last alcohol was earlier today. He was prescribed Percocet yesterday but has been mixing it with alcohol and Vicodin and also snorting it. Denies suicidal ideation or hallucinations. Wants detox.    Past Medical History  Diagnosis Date  . Hypertension   . Hyperlipidemia   . Gastric ulcer   . Hepatitis C   . Substance abuse    Past Surgical History  Procedure Laterality Date  . Stomach surgery     Family History  Problem Relation Age of Onset  . Diabetes Father   . Hypertension Father   . Diabetes Sister   . Hypertension Sister   . Diabetes Brother   . Hypertension Brother    Social History  Substance Use Topics  . Smoking status: Current Some Day Smoker  . Smokeless tobacco: None  . Alcohol Use: 0.0 oz/week    0 Standard drinks or equivalent per week     Comment: drank a quart of beer this morning    Review of Systems  Gastrointestinal: Positive for abdominal pain.  All other systems reviewed and are negative.     Allergies  Ibuprofen and Other  Home Medications   Prior to Admission medications   Medication Sig Start Date End Date Taking? Authorizing Provider  lisinopril (PRINIVIL,ZESTRIL) 5 MG tablet Take 1 tablet (5 mg total) by mouth daily. 02/08/15  Yes Melene Planan Floyd, DO  omeprazole (PRILOSEC) 20 MG  capsule Take 1 capsule (20 mg total) by mouth daily. 05/05/15  Yes Josalyn Funches, MD  ondansetron (ZOFRAN ODT) 4 MG disintegrating tablet Take 1 tablet (4 mg total) by mouth every 8 (eight) hours as needed for nausea or vomiting. 05/07/15  Yes Barrett HenleNicole Elizabeth Nadeau, PA-C  oxyCODONE-acetaminophen (PERCOCET) 5-325 MG tablet Take 1-2 tablets by mouth every 6 (six) hours as needed. 05/09/15  Yes Geoffery Lyonsouglas Delo, MD  pantoprazole (PROTONIX) 20 MG tablet Take 1 tablet (20 mg total) by mouth daily. 05/07/15  Yes Satira SarkNicole Elizabeth Nadeau, PA-C  sucralfate (CARAFATE) 1 G tablet Take 1 tablet (1 g total) by mouth 4 (four) times daily -  with meals and at bedtime. 05/05/15  Yes Josalyn Funches, MD  traMADol (ULTRAM) 50 MG tablet Take 1 tablet (50 mg total) by mouth every 8 (eight) hours as needed for moderate pain. 05/05/15  Yes Josalyn Funches, MD   BP 145/103 mmHg  Pulse 88  Temp(Src) 97.6 F (36.4 C) (Oral)  Resp 18  SpO2 97% Physical Exam  Constitutional: He is oriented to person, place, and time.  Uncomfortable   HENT:  Head: Normocephalic.  MM slightly dry   Eyes: Conjunctivae are normal. Pupils are equal, round, and reactive to light.  Neck: Normal range of motion. Neck supple.  Cardiovascular: Normal rate, regular rhythm and normal heart sounds.   Pulmonary/Chest: Effort normal and breath sounds normal. No respiratory distress. He has no  wheezes.  Abdominal: Soft. Bowel sounds are normal.  + epigastric tenderness. No rebound   Musculoskeletal: Normal range of motion. He exhibits no edema or tenderness.  Neurological: He is alert and oriented to person, place, and time. No cranial nerve deficit. Coordination normal.  Skin: Skin is warm and dry.  Psychiatric: He has a normal mood and affect. His behavior is normal. Judgment and thought content normal.  Nursing note and vitals reviewed.   ED Course  Procedures (including critical care time) Labs Review Labs Reviewed  COMPREHENSIVE  METABOLIC PANEL - Abnormal; Notable for the following:    Glucose, Bld 219 (*)    BUN <5 (*)    Calcium 8.4 (*)    AST 91 (*)    ALT 75 (*)    All other components within normal limits  ETHANOL - Abnormal; Notable for the following:    Alcohol, Ethyl (B) 381 (*)    All other components within normal limits  CBC WITH DIFFERENTIAL/PLATELET  LIPASE, BLOOD    Imaging Review No results found. I have personally reviewed and evaluated these images and lab results as part of my medical decision-making.   EKG Interpretation None      MDM   Final diagnoses:  None    Chad Wolfe is a 49 y.o. male here with ab pain, alcohol intoxication. I had extensive discussion with him and with brother. They request detox. i told him that he will need outpatient detox. They request to talk to social work. Social work gave him a [employer], [employer] of resources. ETOH 381. Lipase nl, LFTs stable. 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 Sleeping after meds, will dc with resources.    Richardean Canal, MD 05/10/15 [phone]

## 2015-05-10 NOTE — ED Notes (Signed)
Awake. Verbally responsive. A/O x4. Resp even and unlabored. No audible adventitious breath sounds noted. ABC's intact.  

## 2015-05-10 NOTE — ED Notes (Signed)
Jama FlavorsDrew Ivins-- [phone] Brother wants to be called with information if pt is admitted. Also, you may call if you need information regarding this pt

## 2015-05-10 NOTE — ED Notes (Signed)
Awake. Verbally responsive. A/O x4. Resp even and unlabored. No audible adventitious breath sounds noted. ABC's intact. IV saline lock patent and intact. Family at bedside. 

## 2015-05-10 NOTE — ED Notes (Signed)
Pt from home via EMS- Per EMS, pt girlfriend reports that pt has been drinking for three days straight with no food. Pt has hx of pancreatitis and Hep C. Pt is slurring words and smells of ETOH. Pt A&O to self but not sure of date and time. Pt is not able to ambulate on his own at this time d/t intoxication. Pt in NAD

## 2015-05-10 NOTE — ED Notes (Signed)
Lab called and report critical ETOH level of 381. Dr. Silverio LayYao aware.

## 2015-05-10 NOTE — Discharge Instructions (Signed)
Stop drinking alcohol.  Take protonix and carafate.   Please call detox center on Monday.   Return to ER if you have worse pain, vomiting.    Emergency Department Resource Guide 1) Find a Doctor and Pay Out of Pocket Although you won't have to find out who is covered by your insurance plan, it is a good idea to ask around and get recommendations. You will then need to call the office and see if the doctor you have chosen will accept you as a new patient and what types of options they offer for patients who are self-pay. Some doctors offer discounts or will set up payment plans for their patients who do not have insurance, but you will need to ask so you aren't surprised when you get to your appointment.  2) Contact Your Local Health Department Not all health departments have doctors that can see patients for sick visits, but many do, so it is worth a call to see if yours does. If you don't know where your local health department is, you can check in your phone book. The CDC also has a tool to help you locate your state's health department, and many state websites also have listings of all of their local health departments.  3) Find a Walk-in Clinic If your illness is not likely to be very severe or complicated, you may want to try a walk in clinic. These are popping up all over the country in pharmacies, drugstores, and shopping centers. They're usually staffed by nurse practitioners or physician assistants that have been trained to treat common illnesses and complaints. They're usually fairly quick and inexpensive. However, if you have serious medical issues or chronic medical problems, these are probably not your best option.  No Primary Care Doctor: - Call Health Connect at  [phone] - they can help you locate a primary care doctor that  accepts your insurance, provides certain services, etc. - Physician Referral Service- [phone]  Chronic Pain Problems: Organization          Address  Phone   Notes  [facility]  [phone] Patients need to be referred by their primary care doctor.   Medication Assistance: Organization         Address  Phone   Notes  [facility] [facility] [address] [place], [place] [phone] --Must be a resident of [place] -- Must have NO insurance coverage whatsoever (no Medicaid/ Medicare, etc.) -- The pt. MUST have a primary care doctor that directs their care regularly and follows them in the community   MedAssist  [phone]   [employer]  [phone]    Agencies that provide inexpensive medical care: Organization         Address  Phone   Notes  [facility]  [phone]   [facility]    [phone]   [facility] [address] [place], [place] [id number] [phone]   [facility] [address], [place] [phone]   Planned Parenthood    [phone]   [facility]    [phone]   [facility] and [facility]  [address], [place] Phone:  [phone], Fax:  [phone] Hours of Operation:  9 am - 6 pm, M-F.  Also accepts Medicaid/Medicare and self-pay.  [facility]  [address]  [address], [place] Phone: [phone], Fax: [phone]. Hours of Operation:  8:30 am - 5:30 pm, M-F.  Also accepts Medicaid and self-pay.  [facility] [address] [place] Phone: [phone]   [place], [place], [place] [phone], Ext. [phone] Mondays & Thursdays: 7-9 AM.  First 15 patients are seen on a first come, first serve basis.    [place] Providers:  Organization         Address  Phone   Notes  [facility] [address], [place] [phone] Also accepts self-pay patients.  [facility] [address], [place]  [phone]   [place], [address], [place] [phone]   [facility] [address], [place] [phone]   Lucianne Lei [address], [place]   [phone] Only accepts [place] Access [place] patients after they have their name applied to their card.   Self-Pay (no insurance) in [facility]:  Organization         Address  Phone   Notes  Sickle Cell Patients, [facility] [place] [phone]   [facility] [place] [phone]   [facility] [place]  [place], [place], [place] [phone]   [facility]/Dr. Osei-Bonsu  [address] [place] or [address], [place] [phone] Phone number for both [place] and [place] locations is the same.  Urgent Medical and [facility] [address] [place] [phone]   [facility] [address] [place] or [address] [phone]   [facility] [address], [place] [phone], phone; [phone], fax Sees patients 1st and 3rd Saturday of every month.  Must not qualify for public or private insurance (i.e. Medicaid, Medicare, [place] Health Choice, Veterans' Benefits)  Household income should be no more than 200% of the poverty level The clinic cannot treat you if you are pregnant or think you are pregnant  Sexually transmitted diseases are not treated at the clinic.    Dental Care: Organization         Address  Phone  Notes  [facility] Department of [facility] [phone] Accepts children up to age 74 who are enrolled in [place] or [place]; pregnant women with a Medicaid card; and  children who have applied for Medicaid or [place] Health Choice, but were declined, whose parents can pay a reduced fee at time of service.  [facility] Department of [facility]  [address], [place] [phone] Accepts children up to age 79 who are enrolled in [place] or [place]; pregnant women with a Medicaid card; and children who have applied for Medicaid or [place] Health Choice, but were declined, whose parents can pay a reduced fee at time of service.  [facility]  [place] [phone] Patients are seen by appointment only. Walk-ins are not accepted. [place] will see patients 64 years of age and older. Monday - Tuesday (8am-5pm) Most Wednesdays (8:30-5pm) $30 per visit, cash only  [place]  [address], [place] [phone] Patients are seen by appointment only. Walk-ins are not accepted. [place] will see patients 31 years of age and older. One Wednesday Evening (Monthly: Volunteer Based).  $30 per visit, cash only  [place]  [phone] for adults; Children under age 53, call Graduate Pediatric Dentistry at [phone]. Children aged 35-14, please call [phone] to request a pediatric application.  Dental services are provided in all areas of dental care including fillings, crowns and bridges, complete and partial dentures, implants, gum treatment, root canals, and extractions. Preventive care is also provided. Treatment is provided to both adults and children. Patients are selected via a lottery and there is often a waiting list.   [facility] [address] [place]  [phone] [email or web address]   [facility] [address] [place], [place] [phone], Ext. [phone] Second and Fourth Thursday of each month, opens at 6:30 AM; Clinic ends at 9 AM.  Patients are seen on a first-come first-served  basis, and a limited number are seen during each clinic.   [facility]  [address] [place] Danker [place], [place] [phone]   Eligibility Requirements You must have lived in [place], [place], or [place] counties for at least the last three months.   You cannot be eligible for state or federal sponsored [employer], including [employer], [place], or [employer].   You generally cannot be eligible for healthcare insurance through your employer.    How to apply: Eligibility screenings are held every Tuesday and Wednesday afternoon from 1:00 pm until 4:00 pm. You do not need an appointment for the interview!  [facility] [address], [place], [place]   [place]  [place] Department  [place]  [phone]    Behavioral Health Resources in the Community: Intensive Outpatient Programs Organization         Address  Phone  Notes  [place] [address], [place], [place] [phone]   [facility] Outpatient [address] [place], [place]   ADS: Alcohol & Drug Svcs [address], [place], [place]   [place] [address],  [place], [place] or [phone]   Substance Abuse Resources Organization         Address  Phone  Notes  Alcohol and Drug Services  [phone]   [place]  [phone]   The [place]   [place]  [phone]   Residential & Outpatient Substance Abuse Program  [phone]   Psychological Services Organization         Address  Phone  Notes  [facility] [place]  [place]  [phone]   [place] [address], [place] or [phone]    Mobile Crisis Teams Organization          Address  Phone  Notes  [facility], Mobile Crisis Care Unit  [phone]   [facility]  [address] [place], [place]   [place] Levels [address], [place] [phone]    Self-Help/Support Groups Organization         Address  Phone             Notes  [place] of [place] -  variety of support groups  [phone] Call for more information  Narcotics Anonymous (NA), [facility] [address], [place]  2 meetings at this location   Residential Facilities manager         Address  Phone  Notes  [facility] [place],    [place]  [phone]   [facility]  [address] [place] [phone], [place], [place]   [place], [place] [phone] Admissions: 8am-3pm M-F  Incentives Substance [place] [address]    [place], [place] [phone]   [facility] [address] [place], [place], [place]   [facility] [address]  [place], [place]   [facility]ight Programs - Intensive Outpatient [address], [place], [place]   [facility] [place].,  [place], [place] [phone] or [phone]   [facility] Services (RTS) [address], [place], [place] Accepts Medicaid  [facility] [place] [address],  [place] [phone] Substance Abuse/Addiction Treatment   [facility] Organization         Address  Phone  Notes  [facility]  [phone]   Domenic Schwab, PhD [address]lis [place], [place]   [phone] or [phone]   [place], [place] [phone]   [facility] [address] [place], [place] [phone] Insurance/Medicaid/sponsorship  through [facility] and Families [address] [place]                                    [place], [place] [phone] [place] [address], [place] [phone]    Dr. Adele Schilder  [phone]   [facility] Dept. 1) [address]  2) [place] 3)  [place] [address], [place] [phone]  [phone]   [place] [phone] or [phone] (After Hours)

## 2015-05-10 NOTE — ED Notes (Signed)
Awake. Verbally responsive. A/O x4. Resp even and unlabored. No audible adventitious breath sounds noted. ABC's intact. IV infusing NS at 999ml/hr without difficulty. Family at bedside. 

## 2015-05-10 NOTE — ED Notes (Signed)
Pt provided with phone to call his wife. Pt refused to get off of phone so Dr Jeraldine LootsLockwood could talk to him and perform exam

## 2015-05-12 ENCOUNTER — Encounter (HOSPITAL_COMMUNITY): Payer: Self-pay | Admitting: Emergency Medicine

## 2015-05-12 ENCOUNTER — Emergency Department (HOSPITAL_COMMUNITY)
Admission: EM | Admit: 2015-05-12 | Discharge: 2015-05-12 | Discharge status: Against Medical Advice | Payer: Self-pay | Attending: Emergency Medicine | Admitting: Emergency Medicine

## 2015-05-12 DIAGNOSIS — Z008 Encounter for other general examination: Secondary | ICD-10-CM | POA: Insufficient documentation

## 2015-05-12 DIAGNOSIS — F329 Major depressive disorder, single episode, unspecified: Secondary | ICD-10-CM | POA: Insufficient documentation

## 2015-05-12 DIAGNOSIS — R45851 Suicidal ideations: Secondary | ICD-10-CM | POA: Insufficient documentation

## 2015-05-12 DIAGNOSIS — Z79899 Other long term (current) drug therapy: Secondary | ICD-10-CM | POA: Insufficient documentation

## 2015-05-12 DIAGNOSIS — F1310 Sedative, hypnotic or anxiolytic abuse, uncomplicated: Secondary | ICD-10-CM | POA: Insufficient documentation

## 2015-05-12 DIAGNOSIS — Z8639 Personal history of other endocrine, nutritional and metabolic disease: Secondary | ICD-10-CM | POA: Insufficient documentation

## 2015-05-12 DIAGNOSIS — F1910 Other psychoactive substance abuse, uncomplicated: Secondary | ICD-10-CM

## 2015-05-12 DIAGNOSIS — R109 Unspecified abdominal pain: Secondary | ICD-10-CM | POA: Insufficient documentation

## 2015-05-12 DIAGNOSIS — F17200 Nicotine dependence, unspecified, uncomplicated: Secondary | ICD-10-CM | POA: Insufficient documentation

## 2015-05-12 DIAGNOSIS — F32A Depression, unspecified: Secondary | ICD-10-CM

## 2015-05-12 DIAGNOSIS — G8929 Other chronic pain: Secondary | ICD-10-CM | POA: Insufficient documentation

## 2015-05-12 DIAGNOSIS — F1110 Opioid abuse, uncomplicated: Secondary | ICD-10-CM | POA: Insufficient documentation

## 2015-05-12 DIAGNOSIS — Z8619 Personal history of other infectious and parasitic diseases: Secondary | ICD-10-CM | POA: Insufficient documentation

## 2015-05-12 DIAGNOSIS — Z8719 Personal history of other diseases of the digestive system: Secondary | ICD-10-CM | POA: Insufficient documentation

## 2015-05-12 LAB — COMPREHENSIVE METABOLIC PANEL
ALT: 75 U/L — AB (ref 17–63)
ANION GAP: 11 (ref 5–15)
AST: 91 U/L — ABNORMAL HIGH (ref 15–41)
Albumin: 3.9 g/dL (ref 3.5–5.0)
Alkaline Phosphatase: 72 U/L (ref 38–126)
BUN: 8 mg/dL (ref 6–20)
CHLORIDE: 101 mmol/L (ref 101–111)
CO2: 25 mmol/L (ref 22–32)
Calcium: 8.9 mg/dL (ref 8.9–10.3)
Creatinine, Ser: 0.9 mg/dL (ref 0.61–1.24)
GFR calc non Af Amer: >60 mL/min (ref >60)
Glucose, Bld: 168 mg/dL — ABNORMAL HIGH (ref 65–99)
Potassium: 3.4 mmol/L — ABNORMAL LOW (ref 3.5–5.1)
SODIUM: 137 mmol/L (ref 135–145)
Total Bilirubin: 1.8 mg/dL — ABNORMAL HIGH (ref 0.3–1.2)
Total Protein: 7.4 g/dL (ref 6.5–8.1)

## 2015-05-12 LAB — URINE MICROSCOPIC-ADD ON: Bacteria, UA: NONE SEEN

## 2015-05-12 LAB — CBC WITH DIFFERENTIAL/PLATELET
Basophils Absolute: 0 10*3/uL (ref 0.0–0.1)
Basophils Relative: 0 %
EOS ABS: 0 10*3/uL (ref 0.0–0.7)
Eosinophils Relative: 0 %
HCT: 45.3 % (ref 39.0–52.0)
HEMOGLOBIN: 15.7 g/dL (ref 13.0–17.0)
LYMPHS ABS: 1.4 10*3/uL (ref 0.7–4.0)
LYMPHS PCT: 19 %
MCH: 33 pg (ref 26.0–34.0)
MCHC: 34.7 g/dL (ref 30.0–36.0)
MCV: 95.2 fL (ref 78.0–100.0)
Monocytes Absolute: 0.7 10*3/uL (ref 0.1–1.0)
Monocytes Relative: 9 %
NEUTROS PCT: 72 %
Neutro Abs: 5.2 10*3/uL (ref 1.7–7.7)
Platelets: 217 10*3/uL (ref 150–400)
RBC: 4.76 MIL/uL (ref 4.22–5.81)
RDW: 12 % (ref 11.5–15.5)
WBC: 7.3 10*3/uL (ref 4.0–10.5)

## 2015-05-12 LAB — LIPASE, BLOOD: Lipase: 28 U/L (ref 11–51)

## 2015-05-12 LAB — RAPID URINE DRUG SCREEN, HOSP PERFORMED
AMPHETAMINES: NOT DETECTED
Barbiturates: POSITIVE — AB
Benzodiazepines: NOT DETECTED
Cocaine: NOT DETECTED
OPIATES: POSITIVE — AB
TETRAHYDROCANNABINOL: NOT DETECTED

## 2015-05-12 LAB — URINALYSIS, ROUTINE W REFLEX MICROSCOPIC
BILIRUBIN URINE: NEGATIVE
Glucose, UA: >1000 mg/dL — AB
HGB URINE DIPSTICK: NEGATIVE
KETONES UR: 15 mg/dL — AB
Leukocytes, UA: NEGATIVE
NITRITE: NEGATIVE
PH: 5.5 (ref 5.0–8.0)
Protein, ur: NEGATIVE mg/dL
Specific Gravity, Urine: 1.013 (ref 1.005–1.030)

## 2015-05-12 LAB — ETHANOL: Alcohol, Ethyl (B): 89 mg/dL — ABNORMAL HIGH (ref <5)

## 2015-05-12 NOTE — ED Notes (Addendum)
INITIAL ASSESSMENT COMPLETED. PT C/O UPPER ABDOMINAL PAIN WITH NAUSEA X3 MONTHS. PT ALSO STATES HE NEEDS DETOX FROM ALCOHOL AND OXYCODONE. LAST DRINK WAS THIS AM. PT STATES USUALLY WHEN HE DRINKS, IT MAKES HIS STOMACH PAIN GO AWAY, BUT FOR THE LAST 3 MONTHS IT HAS NOT WORKED. AWAITING FURTHER ORDERS.

## 2015-05-12 NOTE — Discharge Instructions (Signed)
Abdominal Pain, Adult °Many things can cause abdominal pain. Usually, abdominal pain is not caused by a disease and will improve without treatment. It can often be observed and treated at home. Your health care provider will do a physical exam and possibly order blood tests and X-rays to help determine the seriousness of your pain. However, in many cases, more time must pass before a clear cause of the pain can be found. Before that point, your health care provider may not know if you need more testing or further treatment. °HOME CARE INSTRUCTIONS °Monitor your abdominal pain for any changes. The following actions may help to alleviate any discomfort you are experiencing: °· Only take over-the-counter or prescription medicines as directed by your health care provider. °· Do not take laxatives unless directed to do so by your health care provider. °· Try a clear liquid diet (broth, tea, or water) as directed by your health care provider. Slowly move to a bland diet as tolerated. °SEEK MEDICAL CARE IF: °· You have unexplained abdominal pain. °· You have abdominal pain associated with nausea or diarrhea. °· You have pain when you urinate or have a bowel movement. °· You experience abdominal pain that wakes you in the night. °· You have abdominal pain that is worsened or improved by eating food. °· You have abdominal pain that is worsened with eating fatty foods. °· You have a fever. °SEEK IMMEDIATE MEDICAL CARE IF: °· Your pain does not go away within 2 hours. °· You keep throwing up (vomiting). °· Your pain is felt only in portions of the abdomen, such as the right side or the left lower portion of the abdomen. °· You pass bloody or black tarry stools. °MAKE SURE YOU: °· Understand these instructions. °· Will watch your condition. °· Will get help right away if you are not doing well or get worse. °  °This information is not intended to replace advice given to you by your health care provider. Make sure you discuss  any questions you have with your health care provider. °  °Document Released: 03/03/2005 Document Revised: 02/12/2015 Document Reviewed: 01/31/2013 °Elsevier Interactive Patient Education ©2016 Elsevier Inc. ° °Polysubstance Abuse °When people abuse more than one drug or type of drug it is called polysubstance or polydrug abuse. For example, many smokers also drink alcohol. This is one form of polydrug abuse. Polydrug abuse also refers to the use of a drug to counteract an unpleasant effect produced by another drug. It may also be used to help with withdrawal from another drug. People who take stimulants may become agitated. Sometimes this agitation is countered with a tranquilizer. This helps protect against the unpleasant side effects. Polydrug abuse also refers to the use of different drugs at the same time.  °Anytime drug use is interfering with normal living activities, it has become abuse. This includes problems with family and friends. Psychological dependence has developed when your mind tells you that the drug is needed. This is usually followed by physical dependence which has developed when continuing increases of drug are required to get the same feeling or "high". This is known as addiction or chemical dependency. A person's risk is much higher if there is a history of chemical dependency in the family. °SIGNS OF CHEMICAL DEPENDENCY °· You have been told by friends or family that drugs have become a problem. °· You fight when using drugs. °· You are having blackouts (not remembering what you do while using). °· You feel sick from   using drugs but continue using. °· You lie about use or amounts of drugs (chemicals) used. °· You need chemicals to get you going. °· You are suffering in work performance or in school because of drug use. °· You get sick from use of drugs but continue to use anyway. °· You need drugs to relate to people or feel comfortable in social situations. °· You use drugs to forget  problems. °"Yes" answered to any of the above signs of chemical dependency indicates there are problems. The longer the use of drugs continues, the greater the problems will become. °If there is a family history of drug or alcohol use, it is best not to experiment with these drugs. Continual use leads to tolerance. After tolerance develops more of the drug is needed to get the same feeling. This is followed by addiction. With addiction, drugs become the most important part of life. It becomes more important to take drugs than participate in the other usual activities of life. This includes relating to friends and family. Addiction is followed by dependency. Dependency is a condition where drugs are now needed not just to get high, but to feel normal. °Addiction cannot be cured but it can be stopped. This often requires outside help and the care of professionals. Treatment centers are listed in the yellow pages under: Cocaine, Narcotics, and Alcoholics Anonymous. Most hospitals and clinics can refer you to a specialized care center. Talk to your caregiver if you need help. °  °This information is not intended to replace advice given to you by your health care provider. Make sure you discuss any questions you have with your health care provider. °  °Document Released: 01/13/2005 Document Revised: 08/16/2011 Document Reviewed: 05/29/2014 °Elsevier Interactive Patient Education ©2016 Elsevier Inc. ° °

## 2015-05-12 NOTE — ED Notes (Addendum)
[facility]. Pt states that he is here because he is having pain in his abd  X 2 months.  Has a large scar over his abd from an "ulcer".  Denies NVD.  Wants detox from etoh.  States he had a pint of etoh this morning.  I attempted to contact [facility] but was unable to reach anyone.

## 2015-05-12 NOTE — ED Provider Notes (Signed)
CSN: [id number]     Arrival date & time 05/12/15  1203 History   First MD Initiated Contact with Patient 05/12/15 1224     Chief Complaint  Patient presents with  . Abdominal Pain  . Medical Clearance     (Consider location/radiation/quality/duration/timing/severity/associated sxs/prior Treatment) Patient is a 49 y.o. male presenting with abdominal pain. The history is provided by the patient.  Abdominal Pain Associated symptoms: nausea   Associated symptoms: no chest pain and no shortness of breath   patient presents with abdominal pain. He is a chronic abdominal pain for last few months. Has a previous history of reported gastric ulcer. Also is hepatitis C. Also has abuse of opiates and alcohol. Has had several visits to the ER for the abdominal pain. States he was told was gastritis. Also has had some depression with some suicidal thoughts. Brought in by brother. Brother states he called [facility] they said they will see him and then transfer him to day [facility] after he is detoxed. Last drink last night. Drinks around 3 22 ounce beers a day. Also history of hepatitis C.  Past Medical History  Diagnosis Date  . Hypertension   . Hyperlipidemia   . Gastric ulcer   . Hepatitis C   . Substance abuse    Past Surgical History  Procedure Laterality Date  . Stomach surgery     Family History  Problem Relation Age of Onset  . Diabetes Father   . Hypertension Father   . Diabetes Sister   . Hypertension Sister   . Diabetes Brother   . Hypertension Brother    Social History  Substance Use Topics  . Smoking status: Current Some Day Smoker  . Smokeless tobacco: None  . Alcohol Use: 0.0 oz/week    0 Standard drinks or equivalent per week     Comment: drank a quart of beer this morning    Review of Systems  Constitutional: Negative for activity change.  Respiratory: Negative for shortness of breath.   Cardiovascular: Negative for chest pain.  Gastrointestinal: Positive for nausea and  abdominal pain.  Genitourinary: Negative for penile swelling.  Musculoskeletal: Negative for back pain.  Skin: Negative for color change.  Neurological: Negative for light-headedness.  Psychiatric/Behavioral: Positive for suicidal ideas.      Allergies  Ibuprofen and Other  Home Medications   Prior to Admission medications   Medication Sig Start Date End Date Taking? Authorizing Provider  lisinopril (PRINIVIL,ZESTRIL) 5 MG tablet Take 1 tablet (5 mg total) by mouth daily. 02/08/15  Yes Melene Planan Floyd, DO  omeprazole (PRILOSEC) 20 MG capsule Take 1 capsule (20 mg total) by mouth daily. 05/05/15  Yes Josalyn Funches, MD  ondansetron (ZOFRAN ODT) 4 MG disintegrating tablet Take 1 tablet (4 mg total) by mouth every 8 (eight) hours as needed for nausea or vomiting. 05/07/15  Yes Barrett HenleNicole Elizabeth Nadeau, PA-C  oxyCODONE-acetaminophen (PERCOCET) 5-325 MG tablet Take 1-2 tablets by mouth every 6 (six) hours as needed. Patient taking differently: Take 1-2 tablets by mouth every 6 (six) hours as needed (For pain.).  05/09/15  Yes Geoffery Lyonsouglas Delo, MD  sucralfate (CARAFATE) 1 G tablet Take 1 tablet (1 g total) by mouth 4 (four) times daily -  with meals and at bedtime. 05/10/15  Yes Richardean Canalavid H Yao, MD  traMADol (ULTRAM) 50 MG tablet Take 1 tablet (50 mg total) by mouth every 8 (eight) hours as needed for moderate pain. 05/05/15  Yes Josalyn Funches, MD  pantoprazole (PROTONIX) 20 MG tablet  Take 1 tablet (20 mg total) by mouth daily. Patient not taking: Reported on 05/12/2015 05/10/15   Richardean Canal, MD   BP 138/94 mmHg  Pulse 90  Temp(Src) 98.1 F (36.7 C) (Oral)  Resp 18  SpO2 98% Physical Exam  Constitutional: He appears well-developed.  HENT:  Head: Atraumatic.  Neck: Neck supple.  Cardiovascular:  Mild tachycardia  Pulmonary/Chest: Effort normal.  Abdominal: He exhibits no mass. There is no tenderness. There is no rebound.  Upper midline scar from previous surgery.  Musculoskeletal: He exhibits no  edema.  Neurological: He is alert.  Skin: Skin is warm.  Psychiatric:  Somewhat flat affect    ED Course  Procedures (including critical care time) Labs Review Labs Reviewed  COMPREHENSIVE METABOLIC PANEL - Abnormal; Notable for the following:    Potassium 3.4 (*)    Glucose, Bld 168 (*)    AST 91 (*)    ALT 75 (*)    Total Bilirubin 1.8 (*)    All other components within normal limits  ETHANOL - Abnormal; Notable for the following:    Alcohol, Ethyl (B) 89 (*)    All other components within normal limits  URINE RAPID DRUG SCREEN, HOSP PERFORMED - Abnormal; Notable for the following:    Opiates POSITIVE (*)    Barbiturates POSITIVE (*)    All other components within normal limits  URINALYSIS, ROUTINE W REFLEX MICROSCOPIC (NOT AT [facility]) - Abnormal; Notable for the following:    Glucose, UA >1000 (*)    Ketones, ur 15 (*)    All other components within normal limits  URINE MICROSCOPIC-ADD ON - Abnormal; Notable for the following:    Squamous Epithelial / LPF 0-5 (*)    All other components within normal limits  LIPASE, BLOOD  CBC WITH DIFFERENTIAL/PLATELET    Imaging Review No results found. I have personally reviewed and evaluated these images and lab results as part of my medical decision-making.   EKG Interpretation None      MDM   Final diagnoses:  Chronic abdominal pain  Polysubstance abuse  Depression    Patient presents chronic abdominal pain. Also reported substance abuse of opiates and alcohol. Brought in by his brother. Patient states is some depression and some passive suicidal thoughts. This point he denies active suicidal thoughts. Does not appear to be a major risk to himself or others. I believe would benefit him to stay for sepsis abuse and depression treatment but I do not feel he has enough criteria for involuntary commitment at this time. She does not want to stay and will be discharged AGAINST MEDICAL ADVICE.    Benjiman Core, MD 05/12/15  [phone]

## 2015-05-12 NOTE — ED Notes (Signed)
Chad Wolfe [phone] BROTHER.

## 2015-05-15 DIAGNOSIS — W07XXXA Fall from chair, initial encounter: Secondary | ICD-10-CM | POA: Insufficient documentation

## 2015-05-15 NOTE — Assessment & Plan Note (Signed)
Fall from wheelchair. Slip suspected. Patient still intoxicated. No trauma sustained.

## 2015-05-29 ENCOUNTER — Telehealth: Payer: Self-pay | Admitting: Internal Medicine

## 2015-05-29 NOTE — Telephone Encounter (Signed)
Patient requested a dental referral, patient stated that he has a hole in his tooth and would like to have treatment. Patient also stated that he is in pain. Please f/u

## 2015-05-30 ENCOUNTER — Telehealth: Payer: Self-pay | Admitting: Family Medicine

## 2015-05-30 ENCOUNTER — Emergency Department (HOSPITAL_COMMUNITY)
Admission: EM | Admit: 2015-05-30 | Discharge: 2015-05-30 | Disposition: A | Discharge status: Home / Self Care | Payer: Self-pay | Attending: Emergency Medicine | Admitting: Emergency Medicine

## 2015-05-30 ENCOUNTER — Encounter (HOSPITAL_COMMUNITY): Payer: Self-pay | Admitting: Family Medicine

## 2015-05-30 DIAGNOSIS — K0889 Other specified disorders of teeth and supporting structures: Secondary | ICD-10-CM | POA: Insufficient documentation

## 2015-05-30 DIAGNOSIS — K029 Dental caries, unspecified: Secondary | ICD-10-CM | POA: Insufficient documentation

## 2015-05-30 DIAGNOSIS — I10 Essential (primary) hypertension: Secondary | ICD-10-CM | POA: Insufficient documentation

## 2015-05-30 DIAGNOSIS — Z8619 Personal history of other infectious and parasitic diseases: Secondary | ICD-10-CM | POA: Insufficient documentation

## 2015-05-30 DIAGNOSIS — K08409 Partial loss of teeth, unspecified cause, unspecified class: Secondary | ICD-10-CM | POA: Insufficient documentation

## 2015-05-30 DIAGNOSIS — Z8639 Personal history of other endocrine, nutritional and metabolic disease: Secondary | ICD-10-CM | POA: Insufficient documentation

## 2015-05-30 DIAGNOSIS — F17200 Nicotine dependence, unspecified, uncomplicated: Secondary | ICD-10-CM | POA: Insufficient documentation

## 2015-05-30 DIAGNOSIS — Z79899 Other long term (current) drug therapy: Secondary | ICD-10-CM | POA: Insufficient documentation

## 2015-05-30 MED ORDER — TRAMADOL HCL 50 MG PO TABS
50.0000 mg | ORAL_TABLET | Freq: Once | ORAL | Status: AC
  Administered 2015-05-30: 50 mg via ORAL
  Filled 2015-05-30: qty 1

## 2015-05-30 MED ORDER — PENICILLIN V POTASSIUM 500 MG PO TABS: 500.0000 mg | ORAL_TABLET | Freq: Three times a day (TID) | ORAL | Status: AC

## 2015-05-30 MED ORDER — PENICILLIN V POTASSIUM 250 MG PO TABS
500.0000 mg | ORAL_TABLET | Freq: Once | ORAL | Status: AC
  Administered 2015-05-30: 500 mg via ORAL
  Filled 2015-05-30: qty 2

## 2015-05-30 NOTE — ED Provider Notes (Signed)
CSN: [id number]     Arrival date & time 05/30/15  0940 History  By signing my name below, I, Chad Wolfe, attest that this documentation has been prepared under the direction and in the presence of Chad Gosselin, PA-C Electronically Signed: Charline Wolfe, ED Scribe 05/30/2015 at 10:56 AM.   Chief Complaint  Patient presents with  . Dental Pain   The history is provided by the patient. No language interpreter was used.   HPI Comments: Chad Wolfe is a 49 y.o. male, with a h/o Hepatitis C, substance and alcohol abuse, who presents to the Emergency Department complaining of gradually worsening, constant right upper dental pain for the past 2 weeks. Pt reports a chipped tooth with surrounding pain, exacerbated with palpation. [facility] [address] and was told that they are not taking new appointment until June 10, 2015. He has tried Anbesol, ibuprofen and Tylenol with temporary relief.   Past Medical History  Diagnosis Date  . Hypertension   . Hyperlipidemia   . Gastric ulcer   . Hepatitis C   . Substance abuse    Past Surgical History  Procedure Laterality Date  . Stomach surgery     Family History  Problem Relation Age of Onset  . Diabetes Father   . Hypertension Father   . Diabetes Sister   . Hypertension Sister   . Diabetes Brother   . Hypertension Brother    Social History  Substance Use Topics  . Smoking status: Current Some Day Smoker  . Smokeless tobacco: None  . Alcohol Use: 0.0 oz/week    0 Standard drinks or equivalent per week     Comment: drank a quart of beer this morning    Review of Systems  Constitutional: Negative for fever.  HENT: Positive for dental problem.    Allergies  Aspirin; Ibuprofen; Other; and Tylenol  Home Medications   Prior to Admission medications   Medication Sig Start Date End Date Taking? Authorizing Provider  lisinopril (PRINIVIL,ZESTRIL) 5 MG tablet Take 1 tablet (5 mg total) by mouth daily.  02/08/15   Chad Plan, DO  omeprazole (PRILOSEC) 20 MG capsule Take 1 capsule (20 mg total) by mouth daily. 05/05/15   Chad Funches, MD  ondansetron (ZOFRAN ODT) 4 MG disintegrating tablet Take 1 tablet (4 mg total) by mouth every 8 (eight) hours as needed for nausea or vomiting. 05/07/15   Chad Henle, PA-C  oxyCODONE-acetaminophen (PERCOCET) 5-325 MG tablet Take 1-2 tablets by mouth every 6 (six) hours as needed. Patient taking differently: Take 1-2 tablets by mouth every 6 (six) hours as needed (For pain.).  05/09/15   Chad Lyons, MD  pantoprazole (PROTONIX) 20 MG tablet Take 1 tablet (20 mg total) by mouth daily. Patient not taking: Reported on 05/12/2015 05/10/15   Richardean Canal, MD  penicillin v potassium (VEETID) 500 MG tablet Take 1 tablet (500 mg total) by mouth 3 (three) times daily. 05/30/15 06/06/15  Chad Missildine Patel-Mills, PA-C  sucralfate (CARAFATE) 1 G tablet Take 1 tablet (1 g total) by mouth 4 (four) times daily -  with meals and at bedtime. 05/10/15   Richardean Canal, MD  traMADol (ULTRAM) 50 MG tablet Take 1 tablet (50 mg total) by mouth every 8 (eight) hours as needed for moderate pain. 05/05/15   Chad Funches, MD   BP 153/110 mmHg  Pulse 101  Temp(Src) 97.9 F (36.6 C)  Resp 18  SpO2 100% Physical Exam  Constitutional: He is oriented  to person, place, and time. He appears well-developed and well-nourished. No distress.  HENT:  Head: Normocephalic and atraumatic.  Dental caries and old fractured teeth. Multiple missing teeth. R upper molar that appears to be infected but no fluctuance or drainage from the tooth or gums. No facial swelling.  Eyes: Conjunctivae and EOM are normal.  Neck: Neck supple. No tracheal deviation present.  Cardiovascular: Normal rate.   Pulmonary/Chest: Effort normal. No respiratory distress.  Musculoskeletal: Normal range of motion.  Neurological: He is alert and oriented to person, place, and time.  Skin: Skin is warm and dry.  Psychiatric:  He has a normal mood and affect. His behavior is normal.  Nursing note and vitals reviewed.  ED Course  Dental Date/Time: 05/30/2015 10:59 AM Performed by: Chad GosselinPATEL-MILLS, Chad Wolfe Authorized by: Chad GosselinPATEL-MILLS, Tynslee Bowlds Consent: Verbal consent obtained. Written consent obtained. Risks and benefits: risks, benefits and alternatives were discussed Consent given by: patient Patient understanding: patient states understanding of the procedure being performed Patient consent: the patient's understanding of the procedure matches consent given Procedure consent: procedure consent matches procedure scheduled Patient identity confirmed: verbally with patient Preparation: Patient was prepped and draped in the usual sterile fashion. Local anesthesia used: yes Local anesthetic: bupivacaine 0.5% with epinephrine Anesthetic total: 1 ml Patient sedated: no Patient tolerance: Patient tolerated the procedure well with no immediate complications   (including critical care time) DIAGNOSTIC STUDIES: Oxygen Saturation is 100% on RA, normal by my interpretation.    COORDINATION OF CARE: 10:26 AM-Discussed treatment Wolfe which includes dental block with pt at bedside and pt agreed to Wolfe.   Periodontal Exam Labs Review Labs Reviewed - No data to display  Imaging Review No results found.   EKG Interpretation None      MDM   Final diagnoses:  Pain, dental   Patient with dentalgia.  No abscess requiring immediate incision and drainage.  Exam not concerning for Ludwig's angina or pharyngeal abscess.  Patient was treated with dental block and given penicillin for possible dental infection. Patient has a history of narcotic abuse and was not prescribed narcotics today but was told to take ibuprofen for pain. Pt instructed to keep his follow-up appointment with his dentist on January 3.  Discussed return precautions. Pt safe for discharge. Medications  traMADol (ULTRAM) tablet 50 mg (50 mg Oral Given  05/30/15 1039)  penicillin v potassium (VEETID) tablet 500 mg (500 mg Oral Given 05/30/15 1039)   Filed Vitals:   05/30/15 0948  BP: 153/110  Pulse: 101  Temp: 97.9 F (36.6 C)  Resp: 18   I personally performed the services described in this documentation, which was scribed in my presence. The recorded information has been reviewed and is accurate.    Chad GosselinHanna Patel-Mills, PA-C 05/30/15 1152  Lyndal Pulleyaniel Knott, MD 05/31/15 [phone]

## 2015-05-30 NOTE — Telephone Encounter (Signed)
Pt. Needs referral to dentist as soon as possible  Needs tooth pulled, went to

## 2015-05-30 NOTE — ED Notes (Signed)
C/o bilateral upper dental pain x 2-3 weeks. [facility]tried to make appointment with dentist, unable to until after 06/10/2015.

## 2015-05-30 NOTE — Telephone Encounter (Signed)
Pt needs referral to dentist as soon as possible Went to ED, needs tooth pulled he is in a lot of pain Pt has orange card

## 2015-05-30 NOTE — ED Notes (Signed)
Pt here for left lower dental pain

## 2015-06-03 NOTE — Telephone Encounter (Signed)
Referral placed.

## 2015-06-03 NOTE — Addendum Note (Signed)
Addended by: Dessa PhiFUNCHES, Loyalty Brashier on: 06/03/2015 04:43 PM   Modules accepted: Orders

## 2015-06-13 ENCOUNTER — Emergency Department (HOSPITAL_COMMUNITY)
Admission: EM | Admit: 2015-06-13 | Discharge: 2015-06-13 | Disposition: A | Discharge status: Home / Self Care | Payer: Self-pay | Attending: Emergency Medicine | Admitting: Emergency Medicine

## 2015-06-13 ENCOUNTER — Emergency Department (HOSPITAL_COMMUNITY): Payer: Self-pay

## 2015-06-13 ENCOUNTER — Encounter (HOSPITAL_COMMUNITY): Payer: Self-pay | Admitting: *Deleted

## 2015-06-13 DIAGNOSIS — I10 Essential (primary) hypertension: Secondary | ICD-10-CM | POA: Insufficient documentation

## 2015-06-13 DIAGNOSIS — K002 Abnormalities of size and form of teeth: Secondary | ICD-10-CM | POA: Insufficient documentation

## 2015-06-13 DIAGNOSIS — R1013 Epigastric pain: Secondary | ICD-10-CM

## 2015-06-13 DIAGNOSIS — Z8619 Personal history of other infectious and parasitic diseases: Secondary | ICD-10-CM | POA: Insufficient documentation

## 2015-06-13 DIAGNOSIS — Z8639 Personal history of other endocrine, nutritional and metabolic disease: Secondary | ICD-10-CM | POA: Insufficient documentation

## 2015-06-13 DIAGNOSIS — F17200 Nicotine dependence, unspecified, uncomplicated: Secondary | ICD-10-CM | POA: Insufficient documentation

## 2015-06-13 DIAGNOSIS — G8929 Other chronic pain: Secondary | ICD-10-CM | POA: Insufficient documentation

## 2015-06-13 DIAGNOSIS — K0889 Other specified disorders of teeth and supporting structures: Secondary | ICD-10-CM | POA: Insufficient documentation

## 2015-06-13 DIAGNOSIS — K259 Gastric ulcer, unspecified as acute or chronic, without hemorrhage or perforation: Secondary | ICD-10-CM | POA: Insufficient documentation

## 2015-06-13 DIAGNOSIS — Z79899 Other long term (current) drug therapy: Secondary | ICD-10-CM | POA: Insufficient documentation

## 2015-06-13 DIAGNOSIS — K029 Dental caries, unspecified: Secondary | ICD-10-CM

## 2015-06-13 LAB — COMPREHENSIVE METABOLIC PANEL
ALBUMIN: 3.9 g/dL (ref 3.5–5.0)
ALT: 27 U/L (ref 17–63)
AST: 26 U/L (ref 15–41)
Alkaline Phosphatase: 72 U/L (ref 38–126)
Anion gap: 9 (ref 5–15)
BUN: 7 mg/dL (ref 6–20)
CALCIUM: 9.4 mg/dL (ref 8.9–10.3)
CHLORIDE: 105 mmol/L (ref 101–111)
CO2: 24 mmol/L (ref 22–32)
CREATININE: 0.85 mg/dL (ref 0.61–1.24)
GFR calc non Af Amer: >60 mL/min (ref >60)
Glucose, Bld: 180 mg/dL — ABNORMAL HIGH (ref 65–99)
Potassium: 4 mmol/L (ref 3.5–5.1)
SODIUM: 138 mmol/L (ref 135–145)
Total Bilirubin: 1.1 mg/dL (ref 0.3–1.2)
Total Protein: 7.6 g/dL (ref 6.5–8.1)

## 2015-06-13 LAB — CBC
HCT: 50.3 % (ref 39.0–52.0)
Hemoglobin: 17.1 g/dL — ABNORMAL HIGH (ref 13.0–17.0)
MCH: 32.6 pg (ref 26.0–34.0)
MCHC: 34 g/dL (ref 30.0–36.0)
MCV: 96 fL (ref 78.0–100.0)
PLATELETS: 195 10*3/uL (ref 150–400)
RBC: 5.24 MIL/uL (ref 4.22–5.81)
RDW: 12.1 % (ref 11.5–15.5)
WBC: 6 10*3/uL (ref 4.0–10.5)

## 2015-06-13 LAB — URINALYSIS, ROUTINE W REFLEX MICROSCOPIC
BILIRUBIN URINE: NEGATIVE
GLUCOSE, UA: 500 mg/dL — AB
HGB URINE DIPSTICK: NEGATIVE
KETONES UR: NEGATIVE mg/dL
Leukocytes, UA: NEGATIVE
Nitrite: NEGATIVE
PROTEIN: NEGATIVE mg/dL
Specific Gravity, Urine: 1.009 (ref 1.005–1.030)
pH: 5 (ref 5.0–8.0)

## 2015-06-13 LAB — LIPASE, BLOOD: LIPASE: 36 U/L (ref 11–51)

## 2015-06-13 MED ORDER — TRAMADOL HCL 50 MG PO TABS: 50.0000 mg | ORAL_TABLET | Freq: Three times a day (TID) | ORAL | Status: DC | PRN

## 2015-06-13 MED ORDER — GI COCKTAIL ~~LOC~~
30.0000 mL | Freq: Once | ORAL | Status: AC
  Administered 2015-06-13: 30 mL via ORAL
  Filled 2015-06-13: qty 30

## 2015-06-13 MED ORDER — PANTOPRAZOLE SODIUM 40 MG PO TBEC: 40.0000 mg | DELAYED_RELEASE_TABLET | Freq: Every day | ORAL | Status: DC

## 2015-06-13 MED ORDER — SUCRALFATE 1 G PO TABS: 1.0000 g | ORAL_TABLET | Freq: Three times a day (TID) | ORAL | Status: DC

## 2015-06-13 MED ORDER — PANTOPRAZOLE SODIUM 40 MG PO TBEC
40.0000 mg | DELAYED_RELEASE_TABLET | Freq: Once | ORAL | Status: AC
  Administered 2015-06-13: 40 mg via ORAL
  Filled 2015-06-13: qty 1

## 2015-06-13 MED FILL — SUCRALFATE 1 GM TABLET: 1 | 15 days supply | Qty: 60 | Fill #0

## 2015-06-13 MED FILL — traMADol HCL 50 MG TABS: 50 | 5 days supply | Qty: 15 | Fill #0

## 2015-06-13 MED FILL — LISINOPRIL 5 MG TABLET: 5 | 30 days supply | Qty: 30 | Fill #3

## 2015-06-13 MED FILL — ?PANTOPRAZOLE SOD DR 40MG: 40 MG | 30 days supply | Qty: 30 | Fill #0

## 2015-06-13 NOTE — Discharge Instructions (Signed)
Peptic Ulcer A peptic ulcer is a sore in the lining of your esophagus (esophageal ulcer), stomach (gastric ulcer), or in the first part of your small intestine (duodenal ulcer). The ulcer causes erosion into the deeper tissue. CAUSES  Normally, the lining of the stomach and the small intestine protects itself from the acid that digests food. The protective lining can be damaged by:  An infection caused by a bacterium called Helicobacter pylori (H. pylori).  Regular use of nonsteroidal anti-inflammatory drugs (NSAIDs), such as ibuprofen or aspirin.  Smoking tobacco. Other risk factors include being older than 50, drinking alcohol excessively, and having a family history of ulcer disease.  SYMPTOMS   Burning pain or gnawing in the area between the chest and the belly button.  Heartburn.  Nausea and vomiting.  Bloating. The pain can be worse on an empty stomach and at night. If the ulcer results in bleeding, it can cause:  Black, tarry stools.  Vomiting of bright red blood.  Vomiting of coffee-ground-looking materials. DIAGNOSIS  A diagnosis is usually made based upon your history and an exam. Other tests and procedures may be performed to find the cause of the ulcer. Finding a cause will help determine the best treatment. Tests and procedures may include:  Blood tests, stool tests, or breath tests to check for the bacterium H. pylori.  An upper gastrointestinal (GI) series of the esophagus, stomach, and small intestine.  An endoscopy to examine the esophagus, stomach, and small intestine.  A biopsy. TREATMENT  Treatment may include:  Eliminating the cause of the ulcer, such as smoking, NSAIDs, or alcohol.  Medicines to reduce the amount of acid in your digestive tract.  Antibiotic medicines if the ulcer is caused by the H. pylori bacterium.  An upper endoscopy to treat a bleeding ulcer.  Surgery if the bleeding is severe or if the ulcer created a hole somewhere in the  digestive system. HOME CARE INSTRUCTIONS   Avoid tobacco, alcohol, and caffeine. Smoking can increase the acid in the stomach, and continued smoking will impair the healing of ulcers.  Avoid foods and drinks that seem to cause discomfort or aggravate your ulcer.  Only take medicines as directed by your caregiver. Do not substitute over-the-counter medicines for prescription medicines without talking to your caregiver.  Keep any follow-up appointments and tests as directed. SEEK MEDICAL CARE IF:   Your do not improve within 7 days of starting treatment.  You have ongoing indigestion or heartburn. SEEK IMMEDIATE MEDICAL CARE IF:   You have sudden, sharp, or persistent abdominal pain.  You have bloody or dark black, tarry stools.  You vomit blood or vomit that looks like coffee grounds.  You become light-headed, weak, or feel faint.  You become sweaty or clammy. MAKE SURE YOU:   Understand these instructions.  Will watch your condition.  Will get help right away if you are not doing well or get worse.   This information is not intended to replace advice given to you by your health care provider. Make sure you discuss any questions you have with your health care provider.   Document Released: 05/21/2000 Document Revised: 06/14/2014 Document Reviewed: 12/22/2011 Elsevier Interactive Patient Education 2016 [employer] Inc[employer]  Dental Pain Dental pain may be caused by many things, including:  Tooth decay (cavities or caries). Cavities expose the nerve of your tooth to air and hot or cold temperatures. This can cause pain or discomfort.  Abscess or infection. A dental abscess is a collection of  infected pus from a bacterial infection in the inner part of the tooth (pulp). It usually occurs at the end of the tooth's root.  Injury.  An unknown reason (idiopathic). Your pain may be mild or severe. It may only occur when:  You are chewing.  You are exposed to hot or cold  temperature.  You are eating or drinking sugary foods or beverages, such as soda or candy. Your pain may also be constant. HOME CARE INSTRUCTIONS Watch your dental pain for any changes. The following actions may help to lessen any discomfort that you are feeling:  Take medicines only as directed by your dentist.  If you were prescribed an antibiotic medicine, finish all of it even if you start to feel better.  Keep all follow-up visits as directed by your dentist. This is important.  Do not apply heat to the outside of your face.  Rinse your mouth or gargle with salt water if directed by your dentist. This helps with pain and swelling.  You can make salt water by adding  tsp of salt to 1 cup of warm water.  Apply ice to the painful area of your face:  Put ice in a plastic bag.  Place a towel between your skin and the bag.  Leave the ice on for 20 minutes, 2-3 times per day.  Avoid foods or drinks that cause you pain, such as:  Very hot or very cold foods or drinks.  Sweet or sugary foods or drinks. SEEK MEDICAL CARE IF:  Your pain is not controlled with medicines.  Your symptoms are worse.  You have new symptoms. SEEK IMMEDIATE MEDICAL CARE IF:  You are unable to open your mouth.  You are having trouble breathing or swallowing.  You have a fever.  Your face, neck, or jaw is swollen.   This information is not intended to replace advice given to you by your health care provider. Make sure you discuss any questions you have with your health care provider.   Document Released: 05/24/2005 Document Revised: 10/08/2014 Document Reviewed: 05/20/2014 Elsevier Interactive Patient Education 2016 Elsevier Inc.  Abdominal Pain, Adult Many things can cause abdominal pain. Usually, abdominal pain is not caused by a disease and will improve without treatment. It can often be observed and treated at home. Your health care provider will do a physical exam and possibly order  blood tests and X-rays to help determine the seriousness of your pain. However, in many cases, more time must pass before a clear cause of the pain can be found. Before that point, your health care provider may not know if you need more testing or further treatment. HOME CARE INSTRUCTIONS Monitor your abdominal pain for any changes. The following actions may help to alleviate any discomfort you are experiencing:  Only take over-the-counter or prescription medicines as directed by your health care provider.  Do not take laxatives unless directed to do so by your health care provider.  Try a clear liquid diet (broth, tea, or water) as directed by your health care provider. Slowly move to a bland diet as tolerated. SEEK MEDICAL CARE IF:  You have unexplained abdominal pain.  You have abdominal pain associated with nausea or diarrhea.  You have pain when you urinate or have a bowel movement.  You experience abdominal pain that wakes you in the night.  You have abdominal pain that is worsened or improved by eating food.  You have abdominal pain that is worsened with eating fatty foods.  You have a fever. SEEK IMMEDIATE MEDICAL CARE IF:  Your pain does not go away within 2 hours.  You keep throwing up (vomiting).  Your pain is felt only in portions of the abdomen, such as the right side or the left lower portion of the abdomen.  You pass bloody or black tarry stools. MAKE SURE YOU:  Understand these instructions.  Will watch your condition.  Will get help right away if you are not doing well or get worse.   This information is not intended to replace advice given to you by your health care provider. Make sure you discuss any questions you have with your health care provider.   Document Released: 03/03/2005 Document Revised: 02/12/2015 Document Reviewed: 01/31/2013 Elsevier Interactive Patient Education [employer]

## 2015-06-13 NOTE — ED Provider Notes (Signed)
CSN: [id number]     Arrival date & time 06/13/15  1019 History  By signing my name below, I, [name], attest that this documentation has been prepared under the direction and in the presence of Danelle BerryLeisa Dandre Sisler, PA-C. Electronically Signed: Randell PatientMarrissa Washington, ED Scribe. 06/13/2015. 2:25 PM.   Chief Complaint  Patient presents with  . Abdominal Pain   The history is provided by the patient. No language interpreter was used.   HPI Comments: Chad Wolfe is a 50 y.o. male with a PMHx of gastric ulcer, Hepatitis C, chronic abdominal pain, and substance abuse and a PSHx of stomach surgery who presents to the Emergency Department complaining of ongoing, constant, gradually worsening, epigastric abdominal pain, worse this week. Patient reports that he stopped drinking 3 weeks ago but has been taking OTC pain medication for his dental pain, which increased his abdominal pain to current levels. He endorses gradual improving right upper dental pain. He has taken Zantac, ibuprofen, and Tylenol without relief. Per medical records, patient has been seen multiple times in the past for similar symptoms, which improved when the patient ceased drinking. He states that he has an appointment with his dentist in 3 days for his dental pain. Patient denies nausea and vomiting. He denies drinking recently.  Past Medical History  Diagnosis Date  . Hypertension   . Hyperlipidemia   . Gastric ulcer   . Hepatitis C   . Substance abuse    Past Surgical History  Procedure Laterality Date  . Stomach surgery     Family History  Problem Relation Age of Onset  . Diabetes Father   . Hypertension Father   . Diabetes Sister   . Hypertension Sister   . Diabetes Brother   . Hypertension Brother    Social History  Substance Use Topics  . Smoking status: Current Some Day Smoker  . Smokeless tobacco: None  . Alcohol Use: 0.0 oz/week    0 Standard drinks or equivalent per week     Comment: drank a quart of  beer this morning    Review of Systems  HENT: Positive for dental problem (Dental pain).   Gastrointestinal: Positive for abdominal pain (Epigastric). Negative for nausea and vomiting.  All other systems reviewed and are negative.     Allergies  Aspirin; Ibuprofen; Other; and Tylenol  Home Medications   Prior to Admission medications   Medication Sig Start Date End Date Taking? Authorizing Provider  lisinopril (PRINIVIL,ZESTRIL) 5 MG tablet Take 1 tablet (5 mg total) by mouth daily. 02/08/15   Melene Planan Floyd, DO  omeprazole (PRILOSEC) 20 MG capsule Take 1 capsule (20 mg total) by mouth daily. 05/05/15   Josalyn Funches, MD  ondansetron (ZOFRAN ODT) 4 MG disintegrating tablet Take 1 tablet (4 mg total) by mouth every 8 (eight) hours as needed for nausea or vomiting. 05/07/15   Barrett HenleNicole Elizabeth Nadeau, PA-C  oxyCODONE-acetaminophen (PERCOCET) 5-325 MG tablet Take 1-2 tablets by mouth every 6 (six) hours as needed. Patient taking differently: Take 1-2 tablets by mouth every 6 (six) hours as needed (For pain.).  05/09/15   Geoffery Lyonsouglas Delo, MD  pantoprazole (PROTONIX) 20 MG tablet Take 1 tablet (20 mg total) by mouth daily. Patient not taking: Reported on 05/12/2015 05/10/15   Richardean Canalavid H Yao, MD  sucralfate (CARAFATE) 1 G tablet Take 1 tablet (1 g total) by mouth 4 (four) times daily -  with meals and at bedtime. 05/10/15   Richardean Canalavid H Yao, MD  traMADol Janean Sark(ULTRAM) 50 MG  tablet Take 1 tablet (50 mg total) by mouth every 8 (eight) hours as needed for moderate pain. 05/05/15   Josalyn Funches, MD   BP 148/105 mmHg  Pulse 107  Temp(Src) 98.3 F (36.8 C) (Oral)  Resp 20  SpO2 96% Physical Exam  Constitutional: He is oriented to person, place, and time. He appears well-developed and well-nourished. No distress.  HENT:  Head: Normocephalic and atraumatic.  Right Ear: External ear normal.  Left Ear: External ear normal.  Nose: Nose normal.  Mouth/Throat: Uvula is midline, oropharynx is clear and moist and  mucous membranes are normal. Mucous membranes are not pale, not dry and not cyanotic. No trismus in the jaw. Abnormal dentition. Dental caries present. No uvula swelling. No oropharyngeal exudate, posterior oropharyngeal edema or posterior oropharyngeal erythema.    Eyes: Conjunctivae and EOM are normal. Pupils are equal, round, and reactive to light. Right eye exhibits no discharge. Left eye exhibits no discharge. No scleral icterus.  Neck: Normal range of motion. Neck supple. No JVD present. No tracheal deviation present. No thyromegaly present.  Cardiovascular: Normal rate, regular rhythm, normal heart sounds and intact distal pulses.  Exam reveals no gallop and no friction rub.   No murmur heard. Pulmonary/Chest: Effort normal and breath sounds normal. No stridor. No respiratory distress. He has no wheezes. He has no rales. He exhibits no tenderness.  Abdominal: Soft. Bowel sounds are normal. He exhibits no distension and no mass. There is no tenderness. There is no rebound and no guarding.  Midline abdominal scar, ttp to epigastric area, no gaurding to rebound, non-distended, normal BS x 4, no CVA tenderness  Musculoskeletal: Normal range of motion. He exhibits no edema or tenderness.  Lymphadenopathy:    He has no cervical adenopathy.  Neurological: He is alert and oriented to person, place, and time. He has normal reflexes. No cranial nerve deficit. He exhibits normal muscle tone. Coordination normal.  Skin: Skin is warm and dry. No rash noted. He is not diaphoretic. No erythema. No pallor.  Psychiatric: He has a normal mood and affect. His behavior is normal. Judgment and thought content normal.  Nursing note and vitals reviewed.   ED Course  Procedures  DIAGNOSTIC STUDIES: Oxygen Saturation is 96% on RA, adequate by my interpretation.    COORDINATION OF CARE: 1:29 PM Discussed treatment plan with pt at bedside and pt agreed to plan.  Labs Review Labs Reviewed  COMPREHENSIVE  METABOLIC PANEL - Abnormal; Notable for the following:    Glucose, Bld 180 (*)    All other components within normal limits  CBC - Abnormal; Notable for the following:    Hemoglobin 17.1 (*)    All other components within normal limits  URINALYSIS, ROUTINE W REFLEX MICROSCOPIC (NOT AT [facility]) - Abnormal; Notable for the following:    Glucose, UA 500 (*)    All other components within normal limits  LIPASE, BLOOD    Imaging Review Dg Abd Acute W/chest  06/13/2015  CLINICAL DATA:  Epigastric pain EXAM: DG ABDOMEN ACUTE W/ 1V CHEST COMPARISON:  CT abdomen 05/07/2015 FINDINGS: There is no evidence of dilated bowel loops or free intraperitoneal air. No radiopaque calculi or other significant radiographic abnormality is seen. Heart size and mediastinal contours are within normal limits. Both lungs are clear. IMPRESSION: Negative abdominal radiographs.  No acute cardiopulmonary disease. Electronically Signed   By: Marlan Palau M.D.   On: 06/13/2015 13:56   I have personally reviewed and evaluated these images and lab results  as part of my medical decision-making.   EKG Interpretation None      MDM   Pt with dental pain and chronic epigastric pain, worse due to taking motrin for his tooth.  Seeing the dentist in 3 days, has finished abx.  No drainable abscess.  No concern for Ludwigs angina.  Xrays negative for perforation, films demonstrate normal bowel pattern. On exam abd is soft, mildly ttp in epigastrum, non-distended, non-rigid.  He reports being out of his home meds, will refill protonix, carafate, give tramadol rx for dental pain.  Cannot administer pain meds here, pt does not have a ride, he has been instructed to avoid motrin completely, he cannot take tylenol due to hepatitis.    He denies CP, SOB, N, V, diaphoresis, HA.  He reports compliance with BP meds, diastolic elevated today, likely secondary to pain.  Pt was discharged home in stable condition.   Filed Vitals:   06/13/15  1037 06/13/15 1438  BP: 148/105 143/98  Pulse: 107 93  Temp: 98.3 F (36.8 C)   TempSrc: Oral   Resp: 20 18  SpO2: 96% 100%   I reiterated several times to the patient to not take motrin.  He asked if he could take his motrin because he had pain, I again told him that he cannot take NSAIDS because of ulcers and hx of perforation.  Encouraged to go fill prescribed pain meds.     Final diagnoses:  Pain due to dental caries  Epigastric abdominal pain   I personally performed the services described in this documentation, which was scribed in my presence. The recorded information has been reviewed and is accurate.      Danelle Berry, PA-C 06/13/15 2339  Rolland Porter, MD 06/14/15 1218

## 2015-06-13 NOTE — ED Notes (Signed)
Pt reports ongoing abd pain, believes it could be from taking motrin for dental pain. Denies n/v. No relief with zantac.

## 2015-06-20 ENCOUNTER — Ambulatory Visit: Payer: Self-pay | Admitting: Internal Medicine

## 2015-06-24 ENCOUNTER — Other Ambulatory Visit: Payer: Self-pay

## 2015-07-01 ENCOUNTER — Ambulatory Visit: Payer: Self-pay | Admitting: Family Medicine

## 2015-07-01 MED FILL — traMADol HCL 50 MG TABS: 50 | 3 days supply | Qty: 20 | Fill #0

## 2015-07-01 MED FILL — AMOXICILLIN 500 MG CAPSULE: 500 | 9 days supply | Qty: 28 | Fill #0

## 2015-07-10 ENCOUNTER — Ambulatory Visit: Payer: Self-pay | Admitting: Family Medicine

## 2015-07-14 ENCOUNTER — Other Ambulatory Visit: Payer: Self-pay | Admitting: Internal Medicine

## 2015-07-14 MED FILL — LISINOPRIL 5 MG TABLET: 5 | 30 days supply | Qty: 30 | Fill #0

## 2015-07-17 ENCOUNTER — Encounter (HOSPITAL_COMMUNITY): Payer: Self-pay

## 2015-07-17 ENCOUNTER — Emergency Department (HOSPITAL_COMMUNITY)
Admission: EM | Admit: 2015-07-17 | Discharge: 2015-07-17 | Disposition: A | Discharge status: Home / Self Care | Payer: Self-pay | Attending: Emergency Medicine | Admitting: Emergency Medicine

## 2015-07-17 DIAGNOSIS — Z79899 Other long term (current) drug therapy: Secondary | ICD-10-CM | POA: Insufficient documentation

## 2015-07-17 DIAGNOSIS — G8929 Other chronic pain: Secondary | ICD-10-CM

## 2015-07-17 DIAGNOSIS — I10 Essential (primary) hypertension: Secondary | ICD-10-CM | POA: Insufficient documentation

## 2015-07-17 DIAGNOSIS — Z8619 Personal history of other infectious and parasitic diseases: Secondary | ICD-10-CM | POA: Insufficient documentation

## 2015-07-17 DIAGNOSIS — R1013 Epigastric pain: Secondary | ICD-10-CM | POA: Insufficient documentation

## 2015-07-17 DIAGNOSIS — E785 Hyperlipidemia, unspecified: Secondary | ICD-10-CM | POA: Insufficient documentation

## 2015-07-17 DIAGNOSIS — F1010 Alcohol abuse, uncomplicated: Secondary | ICD-10-CM | POA: Insufficient documentation

## 2015-07-17 DIAGNOSIS — F17200 Nicotine dependence, unspecified, uncomplicated: Secondary | ICD-10-CM | POA: Insufficient documentation

## 2015-07-17 LAB — COMPREHENSIVE METABOLIC PANEL
ALBUMIN: 4.4 g/dL (ref 3.5–5.0)
ALT: 43 U/L (ref 17–63)
ANION GAP: 14 (ref 5–15)
AST: 52 U/L — AB (ref 15–41)
Alkaline Phosphatase: 78 U/L (ref 38–126)
BUN: <5 mg/dL — ABNORMAL LOW (ref 6–20)
CHLORIDE: 105 mmol/L (ref 101–111)
CO2: 23 mmol/L (ref 22–32)
CREATININE: 0.96 mg/dL (ref 0.61–1.24)
Calcium: 9.5 mg/dL (ref 8.9–10.3)
GFR calc Af Amer: >60 mL/min (ref >60)
GFR calc non Af Amer: >60 mL/min (ref >60)
Glucose, Bld: 193 mg/dL — ABNORMAL HIGH (ref 65–99)
POTASSIUM: 3.9 mmol/L (ref 3.5–5.1)
Sodium: 142 mmol/L (ref 135–145)
Total Bilirubin: 1.6 mg/dL — ABNORMAL HIGH (ref 0.3–1.2)
Total Protein: 8.1 g/dL (ref 6.5–8.1)

## 2015-07-17 LAB — CBC
HCT: 49.4 % (ref 39.0–52.0)
HEMOGLOBIN: 17.4 g/dL — AB (ref 13.0–17.0)
MCH: 32.6 pg (ref 26.0–34.0)
MCHC: 35.2 g/dL (ref 30.0–36.0)
MCV: 92.5 fL (ref 78.0–100.0)
PLATELETS: 202 10*3/uL (ref 150–400)
RBC: 5.34 MIL/uL (ref 4.22–5.81)
RDW: 11.7 % (ref 11.5–15.5)
WBC: 10.3 10*3/uL (ref 4.0–10.5)

## 2015-07-17 LAB — URINALYSIS, ROUTINE W REFLEX MICROSCOPIC
Bilirubin Urine: NEGATIVE
GLUCOSE, UA: 250 mg/dL — AB
Hgb urine dipstick: NEGATIVE
Ketones, ur: NEGATIVE mg/dL
LEUKOCYTES UA: NEGATIVE
Nitrite: NEGATIVE
PROTEIN: NEGATIVE mg/dL
Specific Gravity, Urine: 1.005 (ref 1.005–1.030)
pH: 5.5 (ref 5.0–8.0)

## 2015-07-17 LAB — ETHANOL: ALCOHOL ETHYL (B): 156 mg/dL — AB (ref <5)

## 2015-07-17 LAB — LIPASE, BLOOD: LIPASE: 29 U/L (ref 11–51)

## 2015-07-17 MED ORDER — SUCRALFATE 1 GM/10ML PO SUSP: 1.0000 g | Freq: Three times a day (TID) | ORAL | Status: DC

## 2015-07-17 MED ORDER — CARBAMAZEPINE 200 MG PO TABS: ORAL_TABLET | ORAL | Status: DC

## 2015-07-17 MED ORDER — GI COCKTAIL ~~LOC~~
30.0000 mL | Freq: Once | ORAL | Status: AC
  Administered 2015-07-17: 30 mL via ORAL
  Filled 2015-07-17: qty 30

## 2015-07-17 MED ORDER — PANTOPRAZOLE SODIUM 20 MG PO TBEC: 20.0000 mg | DELAYED_RELEASE_TABLET | Freq: Every day | ORAL | Status: DC

## 2015-07-17 MED FILL — CARAFATE 1 GM/10 ML SUSP: 1 | 10 days supply | Qty: 420 | Fill #0

## 2015-07-17 MED FILL — carBAMazepine 200 MG TABS: 200 | 5 days supply | Qty: 11 | Fill #0

## 2015-07-17 MED FILL — PANTOPRAZOLE SOD DR 20 MG T: 20 | 30 days supply | Qty: 30 | Fill #0

## 2015-07-17 NOTE — Discharge Instructions (Signed)
Your abdominal discomfort is likely due to your alcohol use. It is important for you to stop drinking alcohol. He needs her medications as prescribed to help with your discomfort. Use the attached resource guide to contact a substance abuse center. He may take your Tegretol to help detox from alcohol. Follow up with your doctor for reevaluation as needed. Return to ED for any new or worsening symptoms as we discussed.  Alcohol Use Disorder Alcohol use disorder is a mental disorder. It is not a one-time incident of heavy drinking. Alcohol use disorder is the excessive and uncontrollable use of alcohol over time that leads to problems with functioning in one or more areas of daily living. People with this disorder risk harming themselves and others when they drink to excess. Alcohol use disorder also can cause other mental disorders, such as mood and anxiety disorders, and serious physical problems. People with alcohol use disorder often misuse other drugs.  Alcohol use disorder is common and widespread. Some people with this disorder drink alcohol to cope with or escape from negative life events. Others drink to relieve chronic pain or symptoms of mental illness. People with a family history of alcohol use disorder are at higher risk of losing control and using alcohol to excess.  Drinking too much alcohol can cause injury, accidents, and health problems. One drink can be too much when you are:  Working.  Pregnant or breastfeeding.  Taking medicines. Ask your doctor.  Driving or planning to drive. SYMPTOMS  Signs and symptoms of alcohol use disorder may include the following:   Consumption ofalcohol inlarger amounts or over a longer period of time than intended.  Multiple unsuccessful attempts to cutdown or control alcohol use.   A great deal of time spent obtaining alcohol, using alcohol, or recovering from the effects of alcohol (hangover).  A strong desire or urge to use alcohol  (cravings).   Continued use of alcohol despite problems at work, school, or home because of alcohol use.   Continued use of alcohol despite problems in relationships because of alcohol use.  Continued use of alcohol in situations when it is physically hazardous, such as driving a car.  Continued use of alcohol despite awareness of a physical or psychological problem that is likely related to alcohol use. Physical problems related to alcohol use can involve the brain, heart, liver, stomach, and intestines. Psychological problems related to alcohol use include intoxication, depression, anxiety, psychosis, delirium, and dementia.   The need for increased amounts of alcohol to achieve the same desired effect, or a decreased effect from the consumption of the same amount of alcohol (tolerance).  Withdrawal symptoms upon reducing or stopping alcohol use, or alcohol use to reduce or avoid withdrawal symptoms. Withdrawal symptoms include:  Racing heart.  Hand tremor.  Difficulty sleeping.  Nausea.  Vomiting.  Hallucinations.  Restlessness.  Seizures. DIAGNOSIS Alcohol use disorder is diagnosed through an assessment by your health care provider. Your health care provider may start by asking three or four questions to screen for excessive or problematic alcohol use. To confirm a diagnosis of alcohol use disorder, at least two symptoms must be present within a 78-month period. The severity of alcohol use disorder depends on the number of symptoms:  Mild--two or three.  Moderate--four or five.  Severe--six or more. Your health care provider may perform a physical exam or use results from lab tests to see if you have physical problems resulting from alcohol use. Your health care provider may refer  you to a mental health professional for evaluation. TREATMENT  Some people with alcohol use disorder are able to reduce their alcohol use to low-risk levels. Some people with alcohol use  disorder need to quit drinking alcohol. When necessary, mental health professionals with specialized training in substance use treatment can help. Your health care provider can help you decide how severe your alcohol use disorder is and what type of treatment you need. The following forms of treatment are available:   Detoxification. Detoxification involves the use of prescription medicines to prevent alcohol withdrawal symptoms in the first week after quitting. This is important for people with a history of symptoms of withdrawal and for heavy drinkers who are likely to have withdrawal symptoms. Alcohol withdrawal can be dangerous and, in severe cases, cause death. Detoxification is usually provided in a hospital or in-patient substance use treatment facility.  Counseling or talk therapy. Talk therapy is provided by substance use treatment counselors. It addresses the reasons people use alcohol and ways to keep them from drinking again. The goals of talk therapy are to help people with alcohol use disorder find healthy activities and ways to cope with life stress, to identify and avoid triggers for alcohol use, and to handle cravings, which can cause relapse.  Medicines.Different medicines can help treat alcohol use disorder through the following actions:  Decrease alcohol cravings.  Decrease the positive reward response felt from alcohol use.  Produce an uncomfortable physical reaction when alcohol is used (aversion therapy).  Support groups. Support groups are run by people who have quit drinking. They provide emotional support, advice, and guidance. These forms of treatment are often combined. Some people with alcohol use disorder benefit from intensive combination treatment provided by specialized substance use treatment centers. Both inpatient and outpatient treatment programs are available.   This information is not intended to replace advice given to you by your health care provider. Make  sure you discuss any questions you have with your health care provider.   Document Released: 07/01/2004 Document Revised: 06/14/2014 Document Reviewed: 08/31/2012 Elsevier Interactive Patient Education 2016 [employer].  Alcohol Abuse and Nutrition Alcohol abuse is any pattern of alcohol consumption that harms your health, relationships, or work. Alcohol abuse can affect how your body breaks down and absorbs nutrients from food by causing your liver to work abnormally. Additionally, many people who abuse alcohol do not eat enough carbohydrates, protein, fat, vitamins, and minerals. This can cause poor nutrition (malnutrition) and a lack of nutrients (nutrient deficiencies), which can lead to further complications. Nutrients that are commonly lacking (deficient) among people who abuse alcohol include:  Vitamins.  Vitamin A. This is stored in your liver. It is important for your vision, metabolism, and ability to fight off infections (immunity).  B vitamins. These include vitamins such as folate, thiamin, and niacin. These are important in new cell growth and maintenance.  Vitamin C. This plays an important role in iron absorption, wound healing, and immunity.  Vitamin D. This is produced by your liver, but you can also get vitamin D from food. Vitamin D is necessary for your body to absorb and use calcium.  Minerals.  Calcium. This is important for your bones and your heart and blood vessel (cardiovascular) function.  Iron. This is important for blood, muscle, and nervous system functioning.  Magnesium. This plays an important role in muscle and nerve function, and it helps to control blood sugar and blood pressure.  Zinc. This is important for the normal function of  your nervous system and digestive system (gastrointestinal tract). Nutrition is an essential component of therapy for alcohol abuse. Your health care provider or dietitian will work with you to design a plan that can help  restore nutrients to your body and prevent potential complications. WHAT IS MY PLAN? Your dietitian may develop a specific diet plan that is based on your condition and any other complications you may have. A diet plan will commonly include:  A balanced diet.  Grains: 6-8 oz per day.  Vegetables: 2-3 cups per day.  Fruits: 1-2 cups per day.  Meat and other protein: 5-6 oz per day.  Dairy: 2-3 cups per day.  Vitamin and mineral supplements. WHAT DO I NEED TO KNOW ABOUT ALCOHOL AND NUTRITION?  Consume foods that are high in antioxidants, such as grapes, berries, nuts, green tea, and dark green and orange vegetables. This can help to counteract some of the stress that is placed on your liver by consuming alcohol.  Avoid food and drinks that are high in fat and sugar. Foods such as sugared soft drinks, salty snack foods, and candy contain empty calories. This means that they lack important nutrients such as protein, fiber, and vitamins.  Eat frequent meals and snacks. Try to eat 5-6 small meals each day.  Eat a variety of fresh fruits and vegetables each day. This will help you get plenty of water, fiber, and vitamins in your diet.  Drink plenty of water and other clear fluids. Try to drink at least 48-64 oz (1.5-2 L) of water per day.  If you are a vegetarian, eat a variety of protein-rich foods. Pair whole grains with plant-based proteins at meals and snacks to obtain the greatest nutrient benefit from your food. For example, eat rice with beans, put peanut butter on whole-grain toast, or eat oatmeal with sunflower seeds.  Soak beans and whole grains overnight before cooking. This can help your body to absorb the nutrients more easily.  Include foods fortified with vitamins and minerals in your diet. Commonly fortified foods include milk, orange juice, cereal, and bread.  If you are malnourished, your dietitian may recommend a high-protein, high-calorie diet. This may  include:  2,000-3,000 calories (kilocalories) per day.  70-100 grams of protein per day.  Your health care provider may recommend a complete nutritional supplement beverage. This can help to restore calories, protein, and vitamins to your body. Depending on your condition, you may be advised to consume this instead of or in addition to meals.  Limit your intake of caffeine. Replace drinks like coffee and black tea with decaffeinated coffee and herbal tea.  Eat a variety of foods that are high in omega fatty acids. These include fish, nuts and seeds, and soybeans. These foods may help your liver to recover and may also stabilize your mood.  Certain medicines may cause changes in your appetite, taste, and weight. Work with your health care provider and dietitian to make any adjustments to your medicines and diet plan.  Include other healthy lifestyle choices in your daily routine.  Be physically active.  Get enough sleep.  Spend time doing activities that you enjoy.  If you are unable to take in enough food and calories by mouth, your health care provider may recommend a feeding tube. This is a tube that passes through your nose and throat, directly into your stomach. Nutritional supplement beverages can be given to you through the feeding tube to help you get the nutrients you need.  Take vitamin  or mineral supplements as recommended by your health care provider. WHAT FOODS CAN I EAT? Grains Enriched pasta. Enriched rice. Fortified whole-grain bread. Fortified whole-grain cereal. Barley. Brown rice. Quinoa. Millet. Vegetables All fresh, frozen, and canned vegetables. Spinach. Kale. Artichoke. Carrots. Winter squash and pumpkin. Sweet potatoes. Broccoli. Cabbage. Cucumbers. Tomatoes. Sweet peppers. Green beans. Peas. Corn. Fruits All fresh and frozen fruits. Berries. Grapes. Mango. Papaya. Guava. Cherries. Apples. Bananas. Peaches. Plums. Pineapple. Watermelon. Cantaloupe. Oranges.  Avocado. Meats and Other Protein Sources Beef liver. Lean beef. Pork. Fresh and canned chicken. Fresh fish. Oysters. Sardines. Canned tuna. Shrimp. Eggs with yolks. Nuts and seeds. Peanut butter. Beans and lentils. Soybeans. Tofu. Dairy Whole, low-fat, and nonfat milk. Whole, low-fat, and nonfat yogurt. Cottage cheese. Sour cream. Hard and soft cheeses. Beverages Water. Herbal tea. Decaffeinated coffee. Decaffeinated green tea. 100% fruit juice. 100% vegetable juice. Instant breakfast shakes. Condiments Ketchup. Mayonnaise. Mustard. Salad dressing. Barbecue sauce. Sweets and Desserts Sugar-free ice cream. Sugar-free pudding. Sugar-free gelatin. Fats and Oils Butter. Vegetable oil, flaxseed oil, olive oil, and walnut oil. Other Complete nutrition shakes. Protein bars. Sugar-free gum. The items listed above may not be a complete list of recommended foods or beverages. Contact your dietitian for more options. WHAT FOODS ARE NOT RECOMMENDED? Grains Sugar-sweetened breakfast cereals. Flavored instant oatmeal. Fried breads. Vegetables Breaded or deep-fried vegetables. Fruits Dried fruit with added sugar. Candied fruit. Canned fruit in syrup. Meats and Other Protein Sources Breaded or deep-fried meats. Dairy Flavored milks. Fried cheese curds or fried cheese sticks. Beverages Alcohol. Sugar-sweetened soft drinks. Sugar-sweetened tea. Caffeinated coffee and tea. Condiments Sugar. Honey. Agave nectar. Molasses. Sweets and Desserts Chocolate. Cake. Cookies. Candy. Other Potato chips. Pretzels. Salted nuts. Candied nuts. The items listed above may not be a complete list of foods and beverages to avoid. Contact your dietitian for more information.   This information is not intended to replace advice given to you by your health care provider. Make sure you discuss any questions you have with your health care provider.   Document Released: 03/18/2005 Document Revised: 06/14/2014 Document  Reviewed: 12/25/2013 Elsevier Interactive Patient Education 2016 [employer].    Emergency Department Resource Guide 1) Find a Doctor and Pay Out of Pocket Although you won't have to find out who is covered by your insurance plan, it is a good idea to ask around and get recommendations. You will then need to call the office and see if the doctor you have chosen will accept you as a new patient and what types of options they offer for patients who are self-pay. Some doctors offer discounts or will set up payment plans for their patients who do not have insurance, but you will need to ask so you aren't surprised when you get to your appointment.  2) Contact Your Local Health Department Not all health departments have doctors that can see patients for sick visits, but many do, so it is worth a call to see if yours does. If you don't know where your local health department is, you can check in your phone book. The CDC also has a tool to help you locate your state's health department, and many state websites also have listings of all of their local health departments.  3) Find a Walk-in Clinic If your illness is not likely to be very severe or complicated, you may want to try a walk in clinic. These are popping up all over the country in pharmacies, drugstores, and shopping centers. They're usually staffed by nurse  practitioners or physician assistants that have been trained to treat common illnesses and complaints. They're usually fairly quick and inexpensive. However, if you have serious medical issues or chronic medical problems, these are probably not your best option.  No Primary Care Doctor: - Call Health Connect at  [phone] - they can help you locate a primary care doctor that  accepts your insurance, provides certain services, etc. - Physician Referral Service- [phone]  Chronic Pain Problems: Organization         Address  Phone   Notes  [facility]  [phone] Patients need to be referred by their primary care doctor.   Medication Assistance: Organization         Address  Phone   Notes  [facility] Medication [facility] [address] [place] [place], [place] [address] [phone] --Must be a resident of [facility] -- Must have NO insurance coverage whatsoever (no Medicaid/ Medicare, etc.) -- The pt. MUST have a primary care doctor that directs their care regularly and follows them in the community   [facility]  [phone]   [employer]  [phone]    Agencies that provide inexpensive medical care: Organization         Address  Phone   Notes  [facility]  [phone]   [facility]    [phone]   [facility] [address] [place], [place] [address] [phone]   [facility] [address], [place] [phone]   [facility]anned Parenthood    [phone]   [facility]    [phone]   [facility]  [address], [place] Phone:  [phone], Fax:  [phone] Hours of Operation:  9 am - 6 pm, M-F.  Also accepts Medicaid/Medicare and self-pay.  [facility]  [address], [place] Phone: [phone], Fax: [phone]. Hours of Operation:  8:30 am - 5:30 pm, M-F.  Also accepts Medicaid and self-pay.  [facility] [place] [address] [place] Phone: [phone]   [facility] [address] Natasha Bence [place], [place] [phone], Ext. [phone] Mondays & Thursdays: 7-9 AM.  First 15 patients are seen on a first come, first serve basis.    Medicaid-accepting [facility] Providers:  Organization         Address  Phone   Notes  [facility] [address], [place] [phone] Also accepts self-pay patients.  [facility] [address] Laurell Josephs [place], [place]   [phone]   [facility] [address], [place] [phone]   [facility] [address] [place] [phone]   Renaye Rakers [address], [place]   [phone] Only accepts [place] patients after they have their name applied to their card.   Self-Pay (no insurance) in [facility]:  Organization         Address  Phone   Notes  Sickle Cell Patients, [facility] [address] [place], [place] [phone]   [facility] [address] [place], [place] [phone]   [facility] [place]  [address], [place] [phone]   [facility]/Dr. Julio Sicks  [address] [place] or [address], [place] [phone] Phone number for both [place] and [place] locations is the same.  [facility] [address], [place] [phone]   [facility] [address] [place] or [address] [phone]   [facility] [address] [place] [phone], phone; [phone], fax Sees patients 1st and 3rd Saturday of every month.  Must not qualify for public or private insurance (i.e. Medicaid, Medicare, [place] Health Choice, Veterans' Benefits)  Household income should be no more than 200% of the poverty level The clinic cannot treat you if you are pregnant or think you are pregnant  Sexually transmitted diseases are not treated at the clinic.    Dental Care: Organization         Address  Phone  Notes  [facility] Department of [facility] [address] [place], [place] [phone] Accepts children up to age 67 who are enrolled in [place] or [place] Health Choice; pregnant women with a Medicaid card; and children who have applied for Medicaid or [place] Health Choice, but  were declined, whose parents can pay a reduced fee at time of service.  [facility] Department of [facility]  [address], [place] [phone] Accepts children up to age 72 who are enrolled in [place] or [place] Health Choice; pregnant women with a Medicaid card; and children who have applied for Medicaid or [place] Health Choice, but were declined, whose parents can pay a reduced fee at time of service.  [facility]  [address] [place], [place] [phone] Patients are seen by appointment only. Walk-ins are not accepted. [facility] will see patients 39 years of age and older. Monday - Tuesday (8am-5pm) Most Wednesdays (8:30-5pm) $30 per visit, cash only  [facility]  [address], [facility] [phone] Patients are seen by appointment only. Walk-ins are not accepted. [facility] will see patients 68 years of age and older. One Wednesday Evening (Monthly: Volunteer Based).  $30 per visit, cash only  [employer]  [phone] for adults; Children under age 35, call Graduate Pediatric Dentistry at [phone]. Children aged 69-14, please call [phone] to request a pediatric application.  Dental services are provided in all areas of dental care including fillings, crowns and bridges, complete and partial dentures, implants, gum treatment, root canals, and extractions. Preventive care is also provided. Treatment is provided to both adults and children. Patients are selected via a lottery and there is often a waiting list.   [facility] [address] [place]  [phone] [email or web address]   [facility] [address] [place], [place] [phone], Ext. [phone] Second and Fourth Thursday of each month, opens at 6:30 AM; Clinic ends at 9 AM.  Patients are seen on a first-come first-served basis, and a limited number are seen during each clinic.    [facility]  [address] Ether Griffins [place], [place] [phone]   Eligibility Requirements You must have lived in [place], [place], or [place] counties for at least the last three months.   You cannot be eligible for state or federal sponsored [place], including [employer], [place], or [employer].   You generally cannot be eligible for healthcare insurance through your employer.  How to apply: Eligibility screenings are held every Tuesday and Wednesday afternoon from 1:00 pm until 4:00 pm. You do not need an appointment for the interview!  [facility] [address] [place], [place] [phone]   [facility]  [phone]   [facility]  [phone]   [facility] Department  [phone]    Behavioral Health Resources in the Community: Intensive Outpatient Programs Organization         Address  Phone  Notes  [facility] Services [address] [place], [place] [phone]   [facility] [address] [place], [place] [phone]   [facility] [address], [place], [place]  [phone]   [facility] Mental Health [address]  [place], [place] [phone] or [phone]   Substance Abuse Resources Organization         Address  Phone  Notes  [facility]  [phone]   [facility]  [phone]   [facility]  [phone]   Floydene Flock  [phone]   Residential & Outpatient Substance Abuse Program  [phone]   Psychological Services Organization         Address  Phone  Notes  [facility]  [phone]   [facility] Services  [phone]   [facility] Mental Health [address], [place] [phone] or [phone]    Mobile Crisis Teams Organization         Address  Phone  Notes  [facility] Mobile Crisis Care  Unit  [phone]   [facility]  [address] [place], [place] [phone]   Doristine Locks [address] [place] [phone]    Self-Help/Support Groups Organization         Address  Phone             Notes  [facility] [place] - variety of support groups  [phone] Call for more information  Narcotics Anonymous (NA), [facility] [address], [employer] [place]  2 meetings at this location   Statistician         Address  Phone  Notes  [facility] [address],    [place]  [phone]   [facility]  [address] [place] [address], [place], [place] [phone]   [facility] [address] [place], [place] [phone] Admissions: 8am-3pm M-F  [facility] [address],    [place], [place] [phone]   [facility] [address] [place], [place], [place] [phone]   [facility] [address],  [place], [place] [phone]   [facility] [address], [place], [place] [phone]   [facility] [address]  [place], [place] [phone] or [phone]   [facility] [address], [place], [place] [phone] Accepts Medicaid  [facility] [address],  [place] [phone] Substance Abuse/Addiction Treatment   [facility]         Address  Phone  Notes  [facility]  [phone]   Angie Fava, PhD [address] [place], [place]   [phone] or [phone]   [facility]   [address] [place], [place] [phone]   [facility] [address],  [place], [place] [phone]) [phone] Insurance/Medicaid/sponsorship through [facility]  [facility] [address]                                     [place], [place] [phone]) [phone] Therapy/tele-psych/case  [facility] [address]   [place], [place] [phone]) [phone]    Dr. Lolly MustacheArfeen  [phone](336) [phone]   [facility]  [facility] 1) [address], [place] 2) [address], [place] 3)  [address], [place] [phone](336) [phone]) [phone]  [phone]) [phone]   [facility] Child [facility] Hotline [phone]) [phone] or [phone]) [phone] (After Hours)

## 2015-07-17 NOTE — ED Notes (Signed)
Patient arrived by EMS with complaint of epigastric pain x 4 days, reports that he drinks heavily daily, denies drug use. Nausea with same

## 2015-07-17 NOTE — ED Provider Notes (Signed)
CSN: [id number]     Arrival date & time 07/17/15  1213 History   First MD Initiated Contact with Patient 07/17/15 1541     Chief Complaint  Patient presents with  . Abdominal Pain     (Consider location/radiation/quality/duration/timing/severity/associated sxs/prior Treatment) HPI Chad Wolfe is a 50 y.o. male with a history of chronic abdominal pain, alcohol abuse, polysubstance abuse, comes in for evaluation of acute abdominal pain. Describes pain as burning. Reports associated nausea. Patient reports last month he was in a treatment facility for alcohol and substance abuse detox, but approximately 2 weeks ago he relapsed and has been drinking alcohol since that time. He reports today he has had "2 quarts of beer". He reports last month he also had a tooth pulled and was given oxycodone and ran out of this medication on Monday. He denies any suicidal or homicidal ideations, auditory or visual hallucinations. Has been taking Zantac for his discomfort without relief. Denies any other fevers, chills, vomiting, chest pain or shortness of breath, urinary symptoms, diarrhea or constipation, numbness or weakness.  Past Medical History  Diagnosis Date  . Hypertension   . Hyperlipidemia   . Gastric ulcer   . Hepatitis C   . Substance abuse    Past Surgical History  Procedure Laterality Date  . Stomach surgery     Family History  Problem Relation Age of Onset  . Diabetes Father   . Hypertension Father   . Diabetes Sister   . Hypertension Sister   . Diabetes Brother   . Hypertension Brother    Social History  Substance Use Topics  . Smoking status: Current Some Day Smoker  . Smokeless tobacco: None  . Alcohol Use: 0.0 oz/week    0 Standard drinks or equivalent per week     Comment: drank a quart of beer this morning    Review of Systems A 10 point review of systems was completed and was negative except for pertinent positives and negatives as mentioned in the history of present  illness    Allergies  Aspirin; Ibuprofen; Other; and Tylenol  Home Medications   Prior to Admission medications   Medication Sig Start Date End Date Taking? Authorizing Provider  carbamazepine (TEGRETOL) 200 MG tablet  PO QD X 1D, then  PO QD X 1D, then  QD X 1D, then  PO QD X 2D 07/17/15   Joycie Peek, PA-C  lisinopril (PRINIVIL,ZESTRIL) 5 MG tablet Take 1 tablet (5 mg total) by mouth daily. Patient needs office visit for more refills 07/14/15   Quentin Angst, MD  omeprazole (PRILOSEC) 20 MG capsule Take 1 capsule (20 mg total) by mouth daily. 05/05/15   Josalyn Funches, MD  ondansetron (ZOFRAN ODT) 4 MG disintegrating tablet Take 1 tablet (4 mg total) by mouth every 8 (eight) hours as needed for nausea or vomiting. 05/07/15   Barrett Henle, PA-C  oxyCODONE-acetaminophen (PERCOCET) 5-325 MG tablet Take 1-2 tablets by mouth every 6 (six) hours as needed. Patient taking differently: Take 1-2 tablets by mouth every 6 (six) hours as needed (For pain.).  05/09/15   Geoffery Lyons, MD  pantoprazole (PROTONIX) 20 MG tablet Take 1 tablet (20 mg total) by mouth daily. 07/17/15   Joycie Peek, PA-C  sucralfate (CARAFATE) 1 GM/10ML suspension Take 10 mLs (1 g total) by mouth 4 (four) times daily -  with meals and at bedtime. 07/17/15   Joycie Peek, PA-C  traMADol (ULTRAM) 50 MG tablet Take 1 tablet (50 mg  total) by mouth every 8 (eight) hours as needed for moderate pain. 06/13/15   Danelle Berry, PA-C   BP 145/107 mmHg  Pulse 91  Temp(Src) 98.7 F (37.1 C) (Oral)  Resp 18  SpO2 99% Physical Exam  Constitutional: He is oriented to person, place, and time. He appears well-developed and well-nourished.  Slightly uncomfortable, but overall well appearing African American male  HENT:  Head: Normocephalic and atraumatic.  Mouth/Throat: Oropharynx is clear and moist.  Eyes: Conjunctivae are normal. Pupils are equal, round, and reactive to light. Right eye exhibits no  discharge. Left eye exhibits no discharge. No scleral icterus.  Neck: Neck supple.  Cardiovascular: Normal rate, regular rhythm and normal heart sounds.   Pulmonary/Chest: Effort normal and breath sounds normal. No respiratory distress. He has no wheezes. He has no rales.  Abdominal: Soft.  Diffuse tenderness with palpation of epigastrium. Abdomen is otherwise soft, nondistended. No rebound or guarding. No other abnormalities  Musculoskeletal: He exhibits no tenderness.  Neurological: He is alert and oriented to person, place, and time.  Cranial Nerves II-XII grossly intact. No abnormal coordination. Motor strength and sensation are baseline. No tremors.  Skin: Skin is warm and dry. No rash noted.  Psychiatric: He has a normal mood and affect. His behavior is normal. Judgment and thought content normal.  Nursing note and vitals reviewed.   ED Course  Procedures (including critical care time) Labs Review Labs Reviewed  COMPREHENSIVE METABOLIC PANEL - Abnormal; Notable for the following:    Glucose, Bld 193 (*)    BUN <5 (*)    AST 52 (*)    Total Bilirubin 1.6 (*)    All other components within normal limits  CBC - Abnormal; Notable for the following:    Hemoglobin 17.4 (*)    All other components within normal limits  URINALYSIS, ROUTINE W REFLEX MICROSCOPIC (NOT AT [facility]) - Abnormal; Notable for the following:    APPearance CLOUDY (*)    Glucose, UA 250 (*)    All other components within normal limits  ETHANOL - Abnormal; Notable for the following:    Alcohol, Ethyl (B) 156 (*)    All other components within normal limits  LIPASE, BLOOD    Imaging Review No results found. I have personally reviewed and evaluated these images and lab results as part of my medical decision-making.   EKG Interpretation None     Meds given in ED:  Medications  gi cocktail (Maalox,Lidocaine,Donnatal) (30 mLs Oral Given 07/17/15 1623)    Discharge Medication List as of 07/17/2015  4:25 PM     START taking these medications   Details  carbamazepine (TEGRETOL) 200 MG tablet 800mg  PO QD X 1D, then 600mg  PO QD X 1D, then 400mg  QD X 1D, then 200mg  PO QD X 2D, Print    sucralfate (CARAFATE) 1 GM/10ML suspension Take 10 mLs (1 g total) by mouth 4 (four) times daily -  with meals and at bedtime., Starting 07/17/2015, Until Discontinued, Print       Filed Vitals:   07/17/15 1223 07/17/15 1545 07/17/15 1551 07/17/15 1600  BP: 139/110 159/111 151/100 145/107  Pulse: 100 80 91 91  Temp: 98.7 F (37.1 C)     TempSrc: Oral     Resp: 20 17  18   SpO2: 98% 100%  99%    MDM  BECK COFER is a 50 y.o. male with history of hepatitis C, alcohol abuse, polysubstance abuse, chronic abdominal pain, comes in for evaluation of  acute exacerbation of his chronic abdominal pain. 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 His symptoms are unchanged from prior. His exam is consistent with a gastritis. No other concerning physical exam findings. No evidence of DTs or other severe alcohol withdrawal, alcohol level in the ED 156. He is slightly tachycardic on arrival, 100 bpm, but otherwise hemodynamically stable and afebrile. Plan to treat symptomatically with GI cocktail in the ED, prescription for Carafate, Tegretol taper as well as discharged with resource guide for substance abuse/alcohol detox. The patient appears reasonably screened and/or stabilized for discharge and I doubt any other medical condition or other [facility] requiring further screening, evaluation, or treatment in the ED at this time prior to discharge.   Final diagnoses:  Chronic epigastric pain  Alcohol abuse        Joycie Peek, PA-C 07/18/15 0011  Lyndal Pulley, MD 07/18/15 [phone]

## 2015-07-29 MED FILL — traMADol HCL 50 MG TABS: 50 | 3 days supply | Qty: 20 | Fill #0

## 2015-08-25 ENCOUNTER — Other Ambulatory Visit: Payer: Self-pay | Admitting: Internal Medicine

## 2015-08-25 ENCOUNTER — Other Ambulatory Visit: Payer: Self-pay

## 2015-08-25 MED ORDER — LISINOPRIL 5 MG PO TABS: 5.0000 mg | ORAL_TABLET | Freq: Every day | ORAL | Status: DC

## 2015-08-25 MED FILL — LISINOPRIL 5 MG TABLET: 5 | 30 days supply | Qty: 30 | Fill #0

## 2015-09-15 ENCOUNTER — Ambulatory Visit (HOSPITAL_COMMUNITY)
Admission: EM | Admit: 2015-09-15 | Discharge: 2015-09-15 | Disposition: A | Discharge status: Home / Self Care | Payer: No Typology Code available for payment source | Attending: Emergency Medicine | Admitting: Emergency Medicine

## 2015-09-15 DIAGNOSIS — H6123 Impacted cerumen, bilateral: Secondary | ICD-10-CM

## 2015-09-15 LAB — GLUCOSE, CAPILLARY: GLUCOSE-CAPILLARY: 124 mg/dL — AB (ref 65–99)

## 2015-09-15 NOTE — ED Provider Notes (Signed)
CSN: [id number]     Arrival date & time 09/15/15  1654 History   First MD Initiated Contact with Patient 09/15/15 1847     No chief complaint on file.  (Consider location/radiation/quality/duration/timing/severity/associated sxs/prior Treatment) HPI Comments: 50 year old male presents with bilateral ear discomfort and decreased hearing for several days.  Second concern is that of elevated blood sugar. He states he has been feeling a little dizzy once in a while and that he has a strong family history of diabetes. He is requesting a random blood sugar check.   Past Medical History  Diagnosis Date  . Hypertension   . Hyperlipidemia   . Gastric ulcer   . Hepatitis C   . Substance abuse    Past Surgical History  Procedure Laterality Date  . Stomach surgery     Family History  Problem Relation Age of Onset  . Diabetes Father   . Hypertension Father   . Diabetes Sister   . Hypertension Sister   . Diabetes Brother   . Hypertension Brother    Social History  Substance Use Topics  . Smoking status: Current Some Day Smoker  . Smokeless tobacco: Not on file  . Alcohol Use: 0.0 oz/week    0 Standard drinks or equivalent per week     Comment: drank a quart of beer this morning    Review of Systems  Constitutional: Negative.   HENT: Positive for ear pain. Negative for postnasal drip and sneezing.   Respiratory: Negative.   Gastrointestinal: Negative.   Neurological: Negative.   All other systems reviewed and are negative.   Allergies  Aspirin; Ibuprofen; Other; and Tylenol  Home Medications   Prior to Admission medications   Medication Sig Start Date End Date Taking? Authorizing Provider  carbamazepine (TEGRETOL) 200 MG tablet 800mg  PO QD X 1D, then 600mg  PO QD X 1D, then 400mg  QD X 1D, then 200mg  PO QD X 2D 07/17/15   Joycie PeekBenjamin Cartner, PA-C  lisinopril (PRINIVIL,ZESTRIL) 5 MG tablet Take 1 tablet (5 mg total) by mouth daily. Patient needs office visit for more refills  08/25/15   Dessa PhiJosalyn Funches, MD  omeprazole (PRILOSEC) 20 MG capsule Take 1 capsule (20 mg total) by mouth daily. 05/05/15   Josalyn Funches, MD  ondansetron (ZOFRAN ODT) 4 MG disintegrating tablet Take 1 tablet (4 mg total) by mouth every 8 (eight) hours as needed for nausea or vomiting. 05/07/15   Barrett HenleNicole Elizabeth Nadeau, PA-C  oxyCODONE-acetaminophen (PERCOCET) 5-325 MG tablet Take 1-2 tablets by mouth every 6 (six) hours as needed. Patient taking differently: Take 1-2 tablets by mouth every 6 (six) hours as needed (For pain.).  05/09/15   Geoffery Lyonsouglas Delo, MD  pantoprazole (PROTONIX) 20 MG tablet Take 1 tablet (20 mg total) by mouth daily. 07/17/15   Joycie PeekBenjamin Cartner, PA-C  sucralfate (CARAFATE) 1 GM/10ML suspension Take 10 mLs (1 g total) by mouth 4 (four) times daily -  with meals and at bedtime. 07/17/15   Joycie PeekBenjamin Cartner, PA-C  traMADol (ULTRAM) 50 MG tablet Take 1 tablet (50 mg total) by mouth every 8 (eight) hours as needed for moderate pain. 06/13/15   Danelle BerryLeisa Tapia, PA-C   Meds Ordered and Administered this Visit  Medications - No data to display  BP 106/73 mmHg  Pulse 93  Temp(Src) 98.2 F (36.8 C) (Oral)  Resp 16  SpO2 97% No data found.   Physical Exam  Constitutional: He is oriented to person, place, and time. He appears well-developed and well-nourished. No distress.  HENT:  Bilateral TMs occluded with impacted cerumen.  Post irrigation the EACs are clear. The TMs are mildly retracted. TMs with minor injection status post irrigation and cerumen pressure.  Eyes: EOM are normal.  Neck: Normal range of motion. Neck supple.  Cardiovascular: Normal rate.   Pulmonary/Chest: Effort normal. No respiratory distress.  Musculoskeletal: He exhibits no edema.  Neurological: He is alert and oriented to person, place, and time. He exhibits normal muscle tone.  Skin: Skin is warm and dry.  Psychiatric: He has a normal mood and affect.  Nursing note and vitals reviewed.   ED Course  .Ear  Cerumen Removal Date/Time: 09/15/2015 7:17 PM Performed by: Phineas Real, Joniah Bednarski Authorized by: Charm Rings Consent: Verbal consent obtained. Risks and benefits: risks, benefits and alternatives were discussed Consent given by: patient Patient understanding: patient states understanding of the procedure being performed Patient identity confirmed: verbally with patient Local anesthetic: none Location details: right ear Procedure type: irrigation Patient sedated: no Patient tolerance: Patient tolerated the procedure well with no immediate complications Comments: Bilateral ear canal irrigation til cleared   (including critical care time)  Labs Review Labs Reviewed  GLUCOSE, CAPILLARY    Imaging Review No results found.   Visual Acuity Review  Right Eye Distance:   Left Eye Distance:   Bilateral Distance:    Right Eye Near:   Left Eye Near:    Bilateral Near:         MDM   1. Cerumen impaction, bilateral    bilat ear irrigation BS 128 F/U with a PCP    Hayden Rasmussen, NP 09/15/15 [phone]

## 2015-09-15 NOTE — Discharge Instructions (Signed)
Blood sugar 128. Not a diagnosis of diabetes based on this one value.  Cerumen Impaction The structures of the external ear canal secrete a waxy substance known as cerumen. Excess cerumen can build up in the ear canal, causing a condition known as cerumen impaction. Cerumen impaction can cause ear pain and disrupt the function of the ear. The rate of cerumen production differs for each individual. In certain individuals, the configuration of the ear canal may decrease his or her ability to naturally remove cerumen. CAUSES Cerumen impaction is caused by excessive cerumen production or buildup. RISK FACTORS  Frequent use of swabs to clean ears.  Having narrow ear canals.  Having eczema.  Being dehydrated. SIGNS AND SYMPTOMS  Diminished hearing.  Ear drainage.  Ear pain.  Ear itch. TREATMENT Treatment may involve:  Over-the-counter or prescription ear drops to soften the cerumen.  Removal of cerumen by a health care provider. This may be done with:  Irrigation with warm water. This is the most common method of removal.  Ear curettes and other instruments.  Surgery. This may be done in severe cases. HOME CARE INSTRUCTIONS  Take medicines only as directed by your health care provider.  Do not insert objects into the ear with the intent of cleaning the ear. PREVENTION  Do not insert objects into the ear, even with the intent of cleaning the ear. Removing cerumen as a part of normal hygiene is not necessary, and the use of swabs in the ear canal is not recommended.  Drink enough water to keep your urine clear or pale yellow.  Control your eczema if you have it. SEEK MEDICAL CARE IF:  You develop ear pain.  You develop bleeding from the ear.  The cerumen does not clear after you use ear drops as directed.   This information is not intended to replace advice given to you by your health care provider. Make sure you discuss any questions you have with your health care  provider.   Document Released: 07/01/2004 Document Revised: 06/14/2014 Document Reviewed: 01/08/2015 Elsevier Interactive Patient Education [employer]2016 [employer]

## 2015-09-18 ENCOUNTER — Encounter (HOSPITAL_COMMUNITY): Payer: Self-pay

## 2015-09-18 ENCOUNTER — Other Ambulatory Visit: Payer: Self-pay | Admitting: *Deleted

## 2015-09-18 ENCOUNTER — Emergency Department (HOSPITAL_COMMUNITY)
Admission: EM | Admit: 2015-09-18 | Discharge: 2015-09-18 | Disposition: A | Discharge status: Home / Self Care | Payer: No Typology Code available for payment source | Attending: Emergency Medicine | Admitting: Emergency Medicine

## 2015-09-18 DIAGNOSIS — Z79899 Other long term (current) drug therapy: Secondary | ICD-10-CM | POA: Insufficient documentation

## 2015-09-18 DIAGNOSIS — K209 Esophagitis, unspecified without bleeding: Secondary | ICD-10-CM

## 2015-09-18 DIAGNOSIS — I10 Essential (primary) hypertension: Secondary | ICD-10-CM | POA: Insufficient documentation

## 2015-09-18 DIAGNOSIS — K279 Peptic ulcer, site unspecified, unspecified as acute or chronic, without hemorrhage or perforation: Secondary | ICD-10-CM | POA: Insufficient documentation

## 2015-09-18 DIAGNOSIS — Z8619 Personal history of other infectious and parasitic diseases: Secondary | ICD-10-CM | POA: Insufficient documentation

## 2015-09-18 DIAGNOSIS — F17200 Nicotine dependence, unspecified, uncomplicated: Secondary | ICD-10-CM | POA: Insufficient documentation

## 2015-09-18 DIAGNOSIS — E785 Hyperlipidemia, unspecified: Secondary | ICD-10-CM | POA: Insufficient documentation

## 2015-09-18 LAB — CBC
HCT: 43.3 % (ref 39.0–52.0)
Hemoglobin: 15.7 g/dL (ref 13.0–17.0)
MCH: 33 pg (ref 26.0–34.0)
MCHC: 36.3 g/dL — AB (ref 30.0–36.0)
MCV: 91 fL (ref 78.0–100.0)
PLATELETS: 245 10*3/uL (ref 150–400)
RBC: 4.76 MIL/uL (ref 4.22–5.81)
RDW: 12.1 % (ref 11.5–15.5)
WBC: 8.1 10*3/uL (ref 4.0–10.5)

## 2015-09-18 LAB — COMPREHENSIVE METABOLIC PANEL
ALK PHOS: 63 U/L (ref 38–126)
ALT: 34 U/L (ref 17–63)
AST: 27 U/L (ref 15–41)
Albumin: 3.7 g/dL (ref 3.5–5.0)
Anion gap: 11 (ref 5–15)
CALCIUM: 8.9 mg/dL (ref 8.9–10.3)
CHLORIDE: 101 mmol/L (ref 101–111)
CO2: 24 mmol/L (ref 22–32)
CREATININE: 0.91 mg/dL (ref 0.61–1.24)
GFR calc non Af Amer: >60 mL/min (ref >60)
GLUCOSE: 177 mg/dL — AB (ref 65–99)
Potassium: 3.8 mmol/L (ref 3.5–5.1)
SODIUM: 136 mmol/L (ref 135–145)
Total Bilirubin: 0.7 mg/dL (ref 0.3–1.2)
Total Protein: 7.2 g/dL (ref 6.5–8.1)

## 2015-09-18 LAB — URINALYSIS, ROUTINE W REFLEX MICROSCOPIC
BILIRUBIN URINE: NEGATIVE
GLUCOSE, UA: NEGATIVE mg/dL
HGB URINE DIPSTICK: NEGATIVE
Ketones, ur: NEGATIVE mg/dL
Nitrite: NEGATIVE
PROTEIN: NEGATIVE mg/dL
Specific Gravity, Urine: 1.004 — ABNORMAL LOW (ref 1.005–1.030)
pH: 6.5 (ref 5.0–8.0)

## 2015-09-18 LAB — URINE MICROSCOPIC-ADD ON
RBC / HPF: NONE SEEN RBC/hpf (ref 0–5)
SQUAMOUS EPITHELIAL / LPF: NONE SEEN

## 2015-09-18 LAB — LIPASE, BLOOD: LIPASE: 50 U/L (ref 11–51)

## 2015-09-18 MED ORDER — GI COCKTAIL ~~LOC~~
30.0000 mL | Freq: Once | ORAL | Status: AC
  Administered 2015-09-18: 30 mL via ORAL
  Filled 2015-09-18: qty 30

## 2015-09-18 MED ORDER — PANTOPRAZOLE SODIUM 20 MG PO TBEC: 20.0000 mg | DELAYED_RELEASE_TABLET | Freq: Every day | ORAL | Status: DC

## 2015-09-18 MED ORDER — SUCRALFATE 1 GM/10ML PO SUSP: 1.0000 g | Freq: Three times a day (TID) | ORAL | Status: DC

## 2015-09-18 MED ORDER — PANTOPRAZOLE SODIUM 40 MG PO TBEC
40.0000 mg | DELAYED_RELEASE_TABLET | Freq: Every day | ORAL | Status: DC
Start: 2015-09-18 — End: 2015-09-26

## 2015-09-18 MED ORDER — HYDROMORPHONE HCL 1 MG/ML IJ SOLN
1.0000 mg | Freq: Once | INTRAMUSCULAR | Status: AC
  Administered 2015-09-18: 1 mg via INTRAVENOUS
  Filled 2015-09-18: qty 1

## 2015-09-18 MED ORDER — SUCRALFATE 1 G PO TABS: 1.0000 g | ORAL_TABLET | Freq: Three times a day (TID) | ORAL | Status: DC

## 2015-09-18 MED FILL — LISINOPRIL 5 MG TABLET: 5 | 30 days supply | Qty: 30 | Fill #1

## 2015-09-18 MED FILL — ?PANTOPRAZOLE SOD DR 40MG: 40 MG | 30 days supply | Qty: 30 | Fill #0

## 2015-09-18 MED FILL — SUCRALFATE 1 GM TABLET: 1 | 30 days supply | Qty: 30 | Fill #0

## 2015-09-18 NOTE — Telephone Encounter (Signed)
Patients medication was changed to tablets from liquid

## 2015-09-18 NOTE — Discharge Instructions (Signed)
Please see the GI doctor as requested. Please return to the ER if your symptoms worsen; you have increased pain, fevers, chills, inability to keep any medications down, confusion. Otherwise see the outpatient doctor as requested.   Esophagitis Esophagitis is inflammation of the esophagus. The esophagus is the tube that carries food and liquids from your mouth to your stomach. Esophagitis can cause soreness or pain in the esophagus. This condition can make it difficult and painful to swallow.  CAUSES Most causes of esophagitis are not serious. Common causes of this condition include:  Gastroesophageal reflux disease (GERD). This is when stomach contents move back up into the esophagus (reflux).  Repeated vomiting.  An allergic-type reaction, especially caused by food allergies (eosinophilic esophagitis).  Injury to the esophagus by swallowing large pills with or without water, or swallowing certain types of medicines.  Swallowing (ingesting) harmful chemicals, such as household cleaning products.  Heavy alcohol use.  An infection of the esophagus.This most often occurs in people who have a weakened immune system.  Radiation or chemotherapy treatment for cancer.  Certain diseases such as sarcoidosis, Crohn disease, and scleroderma. SYMPTOMS Symptoms of this condition include:  Difficult or painful swallowing.  Pain with swallowing acidic liquids, such as citrus juices.  Pain with burping.  Chest pain.  Difficulty breathing.  Nausea.  Vomiting.  Pain in the abdomen.  Weight loss.  Ulcers in the mouth.  Patches of white material in the mouth (candidiasis).  Fever.  Coughing up blood or vomiting blood.  Stool that is black, tarry, or bright red. DIAGNOSIS Your health care provider will take a medical history and perform a physical exam. You may also have other tests, including:  An endoscopy to examine your stomach and esophagus with a small camera.  A test  that measures the acidity level in your esophagus.  A test that measures how much pressure is on your esophagus.  A barium swallow or modified barium swallow to show the shape, size, and functioning of your esophagus.  Allergy tests. TREATMENT Treatment for this condition depends on the cause of your esophagitis. In some cases, steroids or other medicines may be given to help relieve your symptoms or to treat the underlying cause of your condition. You may have to make some lifestyle changes, such as:  Avoiding alcohol.  Quitting smoking.  Changing your diet.  Exercising.  Changing your sleep habits and your sleep environment. HOME CARE INSTRUCTIONS Take these actions to decrease your discomfort and to help avoid complications. Diet  Follow a diet as recommended by your health care provider. This may involve avoiding foods and drinks such as:  Coffee and tea (with or without caffeine).  Drinks that contain alcohol.  Energy drinks and sports drinks.  Carbonated drinks or sodas.  Chocolate and cocoa.  Peppermint and mint flavorings.  Garlic and onions.  Horseradish.  Spicy and acidic foods, including peppers, chili powder, curry powder, vinegar, hot sauces, and barbecue sauce.  Citrus fruit juices and citrus fruits, such as oranges, lemons, and limes.  Tomato-based foods, such as red sauce, chili, salsa, and pizza with red sauce.  Fried and fatty foods, such as donuts, french fries, potato chips, and high-fat dressings.  High-fat meats, such as hot dogs and fatty cuts of red and white meats, such as rib eye steak, sausage, ham, and bacon.  High-fat dairy items, such as whole milk, butter, and cream cheese.  Eat small, frequent meals instead of large meals.  Avoid drinking large amounts of  liquid with your meals.  Avoid eating meals during the 2-3 hours before bedtime.  Avoid lying down right after you eat.  Do not exercise right after you eat.  Avoid foods  and drinks that seem to make your symptoms worse. General Instructions  Pay attention to any changes in your symptoms.  Take over-the-counter and prescription medicines only as told by your health care provider. Do not take aspirin, ibuprofen, or other NSAIDs unless your health care provider told you to do so.  If you have trouble taking pills, use a pill splitter to decrease the size of the pill. This will decrease the chance of the pill getting stuck or injuring your esophagus on the way down. Also, drink water after you take a pill.  Do not use any tobacco products, including cigarettes, chewing tobacco, and e-cigarettes. If you need help quitting, ask your health care provider.  Wear loose-fitting clothing. Do not wear anything tight around your waist that causes pressure on your abdomen.  Raise (elevate) the head of your bed about 6 inches (15 cm).  Try to reduce your stress, such as with yoga or meditation. If you need help reducing stress, ask your health care provider.  If you are overweight, reduce your weight to an amount that is healthy for you. Ask your health care provider for guidance about a safe weight loss goal.  Keep all follow-up visits as told by your health care provider. This is important. SEEK MEDICAL CARE IF:  You have new symptoms.  You have unexplained weight loss.  You have difficulty swallowing, or it hurts to swallow.  You have wheezing or a persistent cough.  Your symptoms do not improve with treatment.  You have frequent heartburn for more than two weeks. SEEK IMMEDIATE MEDICAL CARE IF:  You have severe pain in your arms, neck, jaw, teeth, or back.  You feel sweaty, dizzy, or light-headed.  You have chest pain or shortness of breath.  You vomit and your vomit looks like blood or coffee grounds.  Your stool is bloody or black.  You have a fever.  You cannot swallow, drink, or eat.   This information is not intended to replace advice given  to you by your health care provider. Make sure you discuss any questions you have with your health care provider.   Document Released: 07/01/2004 Document Revised: 02/12/2015 Document Reviewed: 09/18/2014 Elsevier Interactive Patient Education 2016 [employer].  Food Choices for Peptic Ulcer Disease When you have peptic ulcer disease, the foods you eat and your eating habits are very important. Choosing the right foods can help ease the discomfort of peptic ulcer disease. WHAT GENERAL GUIDELINES DO I NEED TO FOLLOW?  Choose fruits, vegetables, whole grains, and low-fat meat, fish, and poultry.   Keep a food diary to identify foods that cause symptoms.  Avoid foods that cause irritation or pain. These may be different for different people.  Eat frequent small meals instead of three large meals each day. The pain may be worse when your stomach is empty.  Avoid eating close to bedtime. WHAT FOODS ARE NOT RECOMMENDED? The following are some foods and drinks that may worsen your symptoms:  Black, white, and red pepper.  Hot sauce.  Chili peppers.  Chili powder.  Chocolate and cocoa.   Alcohol.  Tea, coffee, and cola (regular and decaffeinated). The items listed above may not be a complete list of foods and beverages to avoid. Contact your dietitian for more information.  This information is not intended to replace advice given to you by your health care provider. Make sure you discuss any questions you have with your health care provider.   Document Released: 08/16/2011 Document Revised: 05/29/2013 Document Reviewed: 03/28/2013 Elsevier Interactive Patient Education [employer]

## 2015-09-18 NOTE — ED Provider Notes (Signed)
CSN: [id number]     Arrival date & time 09/18/15  0744 History   First MD Initiated Contact with Patient 09/18/15 [phone]     Chief Complaint  Patient presents with  . Abdominal Pain     (Consider location/radiation/quality/duration/timing/severity/associated sxs/prior Treatment) HPI Comments: Pt comes in with cc of epigastric pain. Pt has hx of stomach ulcers, HTN, alcohol abuse. He reports that the pan started in the epigastrium 1.5 weeks ago, and it 's burning type pain and sharp pain, non radiating. The pain is intermittent, worse at night time and sometimes after food intake. He denies any emesis, bloody stools. Pt does indicate that sometimes it feels that he has difficulty swallowing.  Patient is a 50 y.o. male presenting with abdominal pain. The history is provided by the patient.  Abdominal Pain Associated symptoms: no chest pain, no cough, no dysuria, no nausea, no shortness of breath and no vomiting     Past Medical History  Diagnosis Date  . Hypertension   . Hyperlipidemia   . Gastric ulcer   . Hepatitis C   . Substance abuse    Past Surgical History  Procedure Laterality Date  . Stomach surgery     Family History  Problem Relation Age of Onset  . Diabetes Father   . Hypertension Father   . Diabetes Sister   . Hypertension Sister   . Diabetes Brother   . Hypertension Brother    Social History  Substance Use Topics  . Smoking status: Current Some Day Smoker  . Smokeless tobacco: None  . Alcohol Use: 0.0 oz/week    0 Standard drinks or equivalent per week     Comment: drank a quart of beer this morning    Review of Systems  Constitutional: Negative for activity change and appetite change.  Respiratory: Negative for cough and shortness of breath.   Cardiovascular: Negative for chest pain.  Gastrointestinal: Positive for abdominal pain. Negative for nausea, vomiting and blood in stool.  Genitourinary: Negative for dysuria.      Allergies  Aspirin;  Ibuprofen; Other; and Tylenol  Home Medications   Prior to Admission medications   Medication Sig Start Date End Date Taking? Authorizing Provider  carbamazepine (TEGRETOL) 200 MG tablet  PO QD X 1D, then  PO QD X 1D, then  QD X 1D, then  PO QD X 2D 07/17/15   Joycie Peek, PA-C  lisinopril (PRINIVIL,ZESTRIL) 5 MG tablet Take 1 tablet (5 mg total) by mouth daily. Patient needs office visit for more refills 08/25/15   Dessa Phi, MD  omeprazole (PRILOSEC) 20 MG capsule Take 1 capsule (20 mg total) by mouth daily. 05/05/15   Josalyn Funches, MD  ondansetron (ZOFRAN ODT) 4 MG disintegrating tablet Take 1 tablet (4 mg total) by mouth every 8 (eight) hours as needed for nausea or vomiting. 05/07/15   Barrett Henle, PA-C  oxyCODONE-acetaminophen (PERCOCET) 5-325 MG tablet Take 1-2 tablets by mouth every 6 (six) hours as needed. Patient taking differently: Take 1-2 tablets by mouth every 6 (six) hours as needed (For pain.).  05/09/15   Geoffery Lyons, MD  pantoprazole (PROTONIX) 20 MG tablet Take 1 tablet (20 mg total) by mouth daily. 07/17/15   Joycie Peek, PA-C  sucralfate (CARAFATE) 1 GM/10ML suspension Take 10 mLs (1 g total) by mouth 4 (four) times daily -  with meals and at bedtime. 07/17/15   Joycie Peek, PA-C  traMADol (ULTRAM) 50 MG tablet Take 1 tablet (50 mg total) by mouth  every 8 (eight) hours as needed for moderate pain. 06/13/15   Danelle BerryLeisa Tapia, PA-C   BP 144/96 mmHg  Pulse 77  Temp(Src) 98.2 F (36.8 C) (Oral)  Resp 14  Ht 5\' 10"  (1.778 m)  Wt 170 lb (77.111 kg)  BMI 24.39 kg/m2  SpO2 100% Physical Exam  Constitutional: He is oriented to person, place, and time. He appears well-developed.  HENT:  Head: Atraumatic.  Neck: Neck supple.  Cardiovascular: Normal rate.   Pulmonary/Chest: Effort normal.  Abdominal: Soft. Bowel sounds are normal. He exhibits no distension. There is tenderness. There is no rebound and no guarding.  Epigastric  tenderness  Neurological: He is alert and oriented to person, place, and time.  Skin: Skin is warm.  Nursing note and vitals reviewed.   ED Course  Procedures (including critical care time) Labs Review Labs Reviewed  COMPREHENSIVE METABOLIC PANEL - Abnormal; Notable for the following:    Glucose, Bld 177 (*)    BUN <5 (*)    All other components within normal limits  CBC - Abnormal; Notable for the following:    MCHC 36.3 (*)    All other components within normal limits  URINALYSIS, ROUTINE W REFLEX MICROSCOPIC (NOT AT [facility]) - Abnormal; Notable for the following:    Specific Gravity, Urine 1.004 (*)    Leukocytes, UA TRACE (*)    All other components within normal limits  URINE MICROSCOPIC-ADD ON - Abnormal; Notable for the following:    Bacteria, UA RARE (*)    All other components within normal limits  LIPASE, BLOOD    Imaging Review No results found. I have personally reviewed and evaluated these images and lab results as part of my medical decision-making.   EKG Interpretation None      MDM   Final diagnoses:  Esophagitis  PUD (peptic ulcer disease)    Pt comes in with epigastric pain. Pt has hx of esophagitis per last CT scan and has known PUD that have needed surgical intervention. The current case appears to be a flair up of the esophagitis - especially since he is having some difficulty with swallowing, but PUD is also very likely given post prandial symptoms. Exam not peritoneal, and we dont think there is a perforation. Will dx with carafate and protonix. GI f/u advised.  Strict ER return precautions have been discussed, and patient is agreeing with the plan and is comfortable with the workup done and the recommendations from the ER.     Derwood KaplanAnkit Cylas Falzone, MD 09/18/15 1052

## 2015-09-18 NOTE — ED Notes (Signed)
Patient here with abdominal pain that he describes as burning and sharp pain. No nausea, no vomiting. Reports that zantac not working

## 2015-09-26 ENCOUNTER — Encounter (HOSPITAL_COMMUNITY): Payer: Self-pay | Admitting: *Deleted

## 2015-09-26 ENCOUNTER — Emergency Department (HOSPITAL_COMMUNITY)
Admission: EM | Admit: 2015-09-26 | Discharge: 2015-09-26 | Disposition: A | Discharge status: Home / Self Care | Payer: No Typology Code available for payment source | Attending: Emergency Medicine | Admitting: Emergency Medicine

## 2015-09-26 DIAGNOSIS — Z79899 Other long term (current) drug therapy: Secondary | ICD-10-CM | POA: Insufficient documentation

## 2015-09-26 DIAGNOSIS — I10 Essential (primary) hypertension: Secondary | ICD-10-CM | POA: Insufficient documentation

## 2015-09-26 DIAGNOSIS — Z8619 Personal history of other infectious and parasitic diseases: Secondary | ICD-10-CM | POA: Insufficient documentation

## 2015-09-26 DIAGNOSIS — Z8639 Personal history of other endocrine, nutritional and metabolic disease: Secondary | ICD-10-CM | POA: Insufficient documentation

## 2015-09-26 DIAGNOSIS — F17210 Nicotine dependence, cigarettes, uncomplicated: Secondary | ICD-10-CM | POA: Insufficient documentation

## 2015-09-26 DIAGNOSIS — K279 Peptic ulcer, site unspecified, unspecified as acute or chronic, without hemorrhage or perforation: Secondary | ICD-10-CM | POA: Insufficient documentation

## 2015-09-26 DIAGNOSIS — R1013 Epigastric pain: Secondary | ICD-10-CM

## 2015-09-26 LAB — COMPREHENSIVE METABOLIC PANEL
ALK PHOS: 55 U/L (ref 38–126)
ALT: 29 U/L (ref 17–63)
ANION GAP: 10 (ref 5–15)
AST: 24 U/L (ref 15–41)
Albumin: 3.6 g/dL (ref 3.5–5.0)
BUN: <5 mg/dL — ABNORMAL LOW (ref 6–20)
CALCIUM: 8.9 mg/dL (ref 8.9–10.3)
CO2: 22 mmol/L (ref 22–32)
Chloride: 107 mmol/L (ref 101–111)
Creatinine, Ser: 0.97 mg/dL (ref 0.61–1.24)
GFR calc non Af Amer: >60 mL/min (ref >60)
Glucose, Bld: 132 mg/dL — ABNORMAL HIGH (ref 65–99)
POTASSIUM: 3.3 mmol/L — AB (ref 3.5–5.1)
SODIUM: 139 mmol/L (ref 135–145)
Total Bilirubin: 1.1 mg/dL (ref 0.3–1.2)
Total Protein: 6.9 g/dL (ref 6.5–8.1)

## 2015-09-26 LAB — URINALYSIS, ROUTINE W REFLEX MICROSCOPIC
Bilirubin Urine: NEGATIVE
Glucose, UA: >1000 mg/dL — AB
HGB URINE DIPSTICK: NEGATIVE
Ketones, ur: 15 mg/dL — AB
Leukocytes, UA: NEGATIVE
NITRITE: NEGATIVE
Protein, ur: NEGATIVE mg/dL
SPECIFIC GRAVITY, URINE: 1.024 (ref 1.005–1.030)
pH: 5.5 (ref 5.0–8.0)

## 2015-09-26 LAB — CBC
HEMATOCRIT: 43 % (ref 39.0–52.0)
HEMOGLOBIN: 15 g/dL (ref 13.0–17.0)
MCH: 32.1 pg (ref 26.0–34.0)
MCHC: 34.9 g/dL (ref 30.0–36.0)
MCV: 92.1 fL (ref 78.0–100.0)
Platelets: 271 10*3/uL (ref 150–400)
RBC: 4.67 MIL/uL (ref 4.22–5.81)
RDW: 12.6 % (ref 11.5–15.5)
WBC: 8.6 10*3/uL (ref 4.0–10.5)

## 2015-09-26 LAB — URINE MICROSCOPIC-ADD ON
RBC / HPF: NONE SEEN RBC/hpf (ref 0–5)
Squamous Epithelial / HPF: NONE SEEN

## 2015-09-26 LAB — LIPASE, BLOOD: LIPASE: 29 U/L (ref 11–51)

## 2015-09-26 MED ORDER — SUCRALFATE 1 G PO TABS: 1.0000 g | ORAL_TABLET | Freq: Three times a day (TID) | ORAL | Status: DC

## 2015-09-26 MED ORDER — ONDANSETRON 4 MG PO TBDP
4.0000 mg | ORAL_TABLET | Freq: Once | ORAL | Status: AC
  Administered 2015-09-26: 4 mg via ORAL

## 2015-09-26 MED ORDER — PANTOPRAZOLE SODIUM 20 MG PO TBEC: 20.0000 mg | DELAYED_RELEASE_TABLET | Freq: Two times a day (BID) | ORAL | Status: DC

## 2015-09-26 MED ORDER — SUCRALFATE 1 G PO TABS
1.0000 g | ORAL_TABLET | Freq: Once | ORAL | Status: AC
  Administered 2015-09-26: 1 g via ORAL
  Filled 2015-09-26: qty 1

## 2015-09-26 MED ORDER — OMEPRAZOLE 20 MG PO CPDR: 20.0000 mg | DELAYED_RELEASE_CAPSULE | Freq: Two times a day (BID) | ORAL | Status: DC

## 2015-09-26 MED ORDER — ONDANSETRON 4 MG PO TBDP
ORAL_TABLET | ORAL | Status: AC
  Filled 2015-09-26: qty 1

## 2015-09-26 MED ORDER — GI COCKTAIL ~~LOC~~
30.0000 mL | Freq: Once | ORAL | Status: AC
  Administered 2015-09-26: 30 mL via ORAL
  Filled 2015-09-26: qty 30

## 2015-09-26 NOTE — ED Notes (Signed)
Pt states abdominal pain x 6 weeks that is worse now.  Now unable to sleep d/t pain.  Vomiting clear fluid.

## 2015-09-26 NOTE — Discharge Instructions (Signed)
Please avoid taking ibuprofen for your abdominal pain as it can worsen the pain.  Follow instruction below.  Take medication as prescribe and follow up with GI specialist for further care.    Food Choices for Peptic Ulcer Disease When you have peptic ulcer disease, the foods you eat and your eating habits are very important. Choosing the right foods can help ease the discomfort of peptic ulcer disease. WHAT GENERAL GUIDELINES DO I NEED TO FOLLOW?  Choose fruits, vegetables, whole grains, and low-fat meat, fish, and poultry.   Keep a food diary to identify foods that cause symptoms.  Avoid foods that cause irritation or pain. These may be different for different people.  Eat frequent small meals instead of three large meals each day. The pain may be worse when your stomach is empty.  Avoid eating close to bedtime. WHAT FOODS ARE NOT RECOMMENDED? The following are some foods and drinks that may worsen your symptoms:  Black, white, and red pepper.  Hot sauce.  Chili peppers.  Chili powder.  Chocolate and cocoa.   Alcohol.  Tea, coffee, and cola (regular and decaffeinated). The items listed above may not be a complete list of foods and beverages to avoid. Contact your dietitian for more information.   This information is not intended to replace advice given to you by your health care provider. Make sure you discuss any questions you have with your health care provider.   Document Released: 08/16/2011 Document Revised: 05/29/2013 Document Reviewed: 03/28/2013 Elsevier Interactive Patient Education 2016 [employer] Inc[employer]  Peptic Ulcer A peptic ulcer is a painful sore in the lining of your esophagus, stomach, or in the first part of your small intestine. The main causes of an ulcer can be:  An infection.  Using certain pain medicines too often or too much.  Smoking. HOME CARE  Avoid smoking, alcohol, and caffeine.  Avoid foods that bother you.  Only take medicine as told  by your doctor. Do not take any medicines your doctor has not approved.  Keep all doctor visits as told. GET HELP IF:  You do not get better in 7 days after starting treatment.  You keep having an upset stomach (indigestion) or heartburn. GET HELP RIGHT AWAY IF:  You have sudden, sharp, or lasting belly (abdominal) pain.  You have bloody, black, or tarry poop (stool).  You throw up (vomit) blood or your throw up looks like coffee grounds.  You get light-headed, weak, or feel like you will pass out (faint).  You get sweaty or feel sticky and cold to the touch (clammy). MAKE SURE YOU:   Understand these instructions.  Will watch your condition.  Will get help right away if you are not doing well or get worse.   This information is not intended to replace advice given to you by your health care provider. Make sure you discuss any questions you have with your health care provider.   Document Released: 08/18/2009 Document Revised: 06/14/2014 Document Reviewed: 12/22/2011 Elsevier Interactive Patient Education [employer]

## 2015-09-26 NOTE — ED Provider Notes (Signed)
CSN: [id number]     Arrival date & time 09/26/15  1217 History   First MD Initiated Contact with Patient 09/26/15 1554     Chief Complaint  Patient presents with  . Abdominal Pain     (Consider location/radiation/quality/duration/timing/severity/associated sxs/prior Treatment) HPI   50 year old male with history of substance abuse, alcohol abuse, hepatitis C, peptic ulcer disease and hypertension presenting for evaluation of abdominal pain. Patient report recurrent epigastric abdominal pain ongoing for the past 6 months. He relates the pain to peptic ulcer disease that he was diagnosed in the past along with esophagitis. Initially was related to alcohol but he has quit alcohol for 3-4 weeks duration without adequate improvement. For the past 2-3 weeks symptom has worsened. Describe pain as a 10 out of 10 burning sensation, and epigastric region nonradiating worsening with eating greasy fried food.  He admits to eating fried chicken last night and his pain has been persistent. He admits to taking ibuprofen 800 mg 3 times daily to help with his pain. He also pick up smoking in the past 2 months to help with pain relief. He report occasional self induced vomiting to help with the pain. Otherwise patient denies fever, chills, lightheadedness, dizziness, chest pain, shortness of breath, back pain, low abdominal pain, hematemesis, hematochezia or melena. No dysuria. She mentioned that he is going to follow-up with the GI specialist on May 19.    Past Medical History  Diagnosis Date  . Hypertension   . Hyperlipidemia   . Gastric ulcer   . Hepatitis C   . Substance abuse    Past Surgical History  Procedure Laterality Date  . Stomach surgery     Family History  Problem Relation Age of Onset  . Diabetes Father   . Hypertension Father   . Diabetes Sister   . Hypertension Sister   . Diabetes Brother   . Hypertension Brother    Social History  Substance Use Topics  . Smoking status: Current  Some Day Smoker  . Smokeless tobacco: None  . Alcohol Use: 0.0 oz/week    0 Standard drinks or equivalent per week     Comment: drank a quart of beer this morning PT DENIES 09/26/15    Review of Systems  All other systems reviewed and are negative.     Allergies  Aspirin; Ibuprofen; Other; and Tylenol  Home Medications   Prior to Admission medications   Medication Sig Start Date End Date Taking? Authorizing Provider  carbamazepine (TEGRETOL) 200 MG tablet 800mg  PO QD X 1D, then 600mg  PO QD X 1D, then 400mg  QD X 1D, then 200mg  PO QD X 2D 07/17/15   Joycie PeekBenjamin Cartner, PA-C  lisinopril (PRINIVIL,ZESTRIL) 5 MG tablet Take 1 tablet (5 mg total) by mouth daily. Patient needs office visit for more refills 08/25/15   Dessa PhiJosalyn Funches, MD  omeprazole (PRILOSEC) 20 MG capsule Take 1 capsule (20 mg total) by mouth daily. 05/05/15   Josalyn Funches, MD  ondansetron (ZOFRAN ODT) 4 MG disintegrating tablet Take 1 tablet (4 mg total) by mouth every 8 (eight) hours as needed for nausea or vomiting. 05/07/15   Barrett HenleNicole Elizabeth Nadeau, PA-C  oxyCODONE-acetaminophen (PERCOCET) 5-325 MG tablet Take 1-2 tablets by mouth every 6 (six) hours as needed. Patient taking differently: Take 1-2 tablets by mouth every 6 (six) hours as needed (For pain.).  05/09/15   Geoffery Lyonsouglas Delo, MD  pantoprazole (PROTONIX) 20 MG tablet Take 1 tablet (20 mg total) by mouth daily. 09/18/15   Ankit  Rhunette Croft, MD  pantoprazole (PROTONIX) 40 MG tablet Take 1 tablet (40 mg total) by mouth daily. 09/18/15   Derwood Kaplan, MD  sucralfate (CARAFATE) 1 g tablet Take 1 tablet (1 g total) by mouth 4 (four) times daily -  with meals and at bedtime. 09/18/15   Quentin Angst, MD  traMADol (ULTRAM) 50 MG tablet Take 1 tablet (50 mg total) by mouth every 8 (eight) hours as needed for moderate pain. 06/13/15   Danelle Berry, PA-C   BP 145/96 mmHg  Pulse 57  Temp(Src) 98 F (36.7 C) (Oral)  Resp 16  Ht  (1.753 m)  Wt 77.111 kg  BMI 25.09 kg/m2   SpO2 99% Physical Exam  Constitutional: He is oriented to person, place, and time. He appears well-developed and well-nourished. No distress.  African-American male sitting in bed in no acute discomfort.  HENT:  Head: Atraumatic.  Eyes: Conjunctivae are normal.  Neck: Neck supple.  Cardiovascular: Normal rate and regular rhythm.   Pulmonary/Chest: Effort normal and breath sounds normal. No respiratory distress.  Abdominal: Soft. There is tenderness (Tenderness to epigastric region without guarding rebound tenderness. Midline abdominal surgical scar consistent with hx of prior surgery.).  Neurological: He is alert and oriented to person, place, and time.  Skin: No rash noted.  Psychiatric: He has a normal mood and affect.  Nursing note and vitals reviewed.   ED Course  Procedures (including critical care time) Labs Review Labs Reviewed  COMPREHENSIVE METABOLIC PANEL - Abnormal; Notable for the following:    Potassium 3.3 (*)    Glucose, Bld 132 (*)    BUN <5 (*)    All other components within normal limits  URINALYSIS, ROUTINE W REFLEX MICROSCOPIC (NOT AT [facility]) - Abnormal; Notable for the following:    Glucose, UA >1000 (*)    Ketones, ur 15 (*)    All other components within normal limits  URINE MICROSCOPIC-ADD ON - Abnormal; Notable for the following:    Bacteria, UA RARE (*)    All other components within normal limits  LIPASE, BLOOD  CBC    Imaging Review No results found. I have personally reviewed and evaluated these images and lab results as part of my medical decision-making.   EKG Interpretation None      MDM   Final diagnoses:  PUD (peptic ulcer disease)    BP 131/95 mmHg  Pulse 54  Temp(Src) 98 F (36.7 C) (Oral)  Resp 16  Ht  (1.753 m)  Wt 77.111 kg  BMI 25.09 kg/m2  SpO2 96%   4:58 PM Patient symptoms consistence with PUD. Suspect causative factors including prior alcohol abuse and now recurrent use of NSAIDs including ibuprofen as  well as a poor diet. He has no peritoneal signs to suggest stomach or bowel perforation of his abdomen. He is resting comfortably. Labs are reassuring. I encouraged patient to avoid eating fried food, also instruct to use PPI H2 blocker appropriately and also to avoid NSAIDs. He will follow-up with GI specialist as previously scheduled. Return precaution discussed.  Fayrene Helper, PA-C 09/26/15 1853  Loren Racer, MD 09/27/15 [phone]

## 2015-09-26 NOTE — ED Notes (Signed)
Pt has a hx of abd surgery for an ulcer in 1980s-- was seen here 1 week ago for same, states has not had any etoh since, but pain is still present in epigastric area.

## 2015-09-29 MED FILL — SUCRALFATE 1 GM TABLET: 1 | 10 days supply | Qty: 30 | Fill #0

## 2015-09-29 MED FILL — OMEPRAZOLE DR 20 MG CAPSULE: 20 | 10 days supply | Qty: 30 | Fill #0

## 2015-09-29 MED FILL — PANTOPRAZOLE SOD DR 20 MG T: 20 | 20 days supply | Qty: 40 | Fill #0

## 2015-10-20 ENCOUNTER — Other Ambulatory Visit: Payer: Self-pay | Admitting: Family Medicine

## 2015-10-20 MED FILL — LISINOPRIL 5 MG TABLET: 5 | 30 days supply | Qty: 30 | Fill #0

## 2015-10-22 ENCOUNTER — Ambulatory Visit: Payer: No Typology Code available for payment source | Attending: Internal Medicine | Admitting: Internal Medicine

## 2015-10-22 ENCOUNTER — Encounter: Payer: Self-pay | Admitting: Internal Medicine

## 2015-10-22 VITALS — BP 131/72 | HR 79 | Temp 98.0°F | Wt 175.0 lb

## 2015-10-22 DIAGNOSIS — R7303 Prediabetes: Secondary | ICD-10-CM | POA: Insufficient documentation

## 2015-10-22 DIAGNOSIS — K2100 Gastro-esophageal reflux disease with esophagitis, without bleeding: Secondary | ICD-10-CM

## 2015-10-22 DIAGNOSIS — R109 Unspecified abdominal pain: Secondary | ICD-10-CM | POA: Insufficient documentation

## 2015-10-22 DIAGNOSIS — Z79899 Other long term (current) drug therapy: Secondary | ICD-10-CM | POA: Insufficient documentation

## 2015-10-22 DIAGNOSIS — I10 Essential (primary) hypertension: Secondary | ICD-10-CM | POA: Insufficient documentation

## 2015-10-22 DIAGNOSIS — R1013 Epigastric pain: Secondary | ICD-10-CM

## 2015-10-22 DIAGNOSIS — B182 Chronic viral hepatitis C: Secondary | ICD-10-CM | POA: Insufficient documentation

## 2015-10-22 LAB — LIPID PANEL
Cholesterol: 207 mg/dL — ABNORMAL HIGH (ref 125–200)
HDL: 49 mg/dL (ref >=40)
LDL CALC: 132 mg/dL — AB (ref <130)
TRIGLYCERIDES: 129 mg/dL (ref <150)
Total CHOL/HDL Ratio: 4.2 Ratio (ref <=5.0)
VLDL: 26 mg/dL (ref <30)

## 2015-10-22 MED ORDER — LISINOPRIL 5 MG PO TABS: 5.0000 mg | ORAL_TABLET | Freq: Every day | ORAL | Status: DC

## 2015-10-22 MED ORDER — RANITIDINE HCL 150 MG PO TABS: 150.0000 mg | ORAL_TABLET | Freq: Two times a day (BID) | ORAL | Status: DC

## 2015-10-22 MED ORDER — PANTOPRAZOLE SODIUM 20 MG PO TBEC: 20.0000 mg | DELAYED_RELEASE_TABLET | Freq: Two times a day (BID) | ORAL | Status: DC

## 2015-10-22 MED ORDER — SUCRALFATE 1 GM/10ML PO SUSP: 1.0000 g | Freq: Three times a day (TID) | ORAL | Status: DC

## 2015-10-22 MED FILL — CARAFATE 1 GM/10 ML SUSP: 1 | 10 days supply | Qty: 420 | Fill #0

## 2015-10-22 MED FILL — raNITIdine HCL 150 MG TABS: 150 | 30 days supply | Qty: 60 | Fill #0

## 2015-10-22 MED FILL — PANTOPRAZOLE SOD DR 20 MG T: 20 | 30 days supply | Qty: 60 | Fill #0

## 2015-10-22 NOTE — Progress Notes (Signed)
Chad Wolfe, is a 50 y.o. male  ZOX:[id number]  [id number]  DOB - [birth year]  CC:  Chief Complaint  Patient presents with  . Medication Refill  . Abdominal Pain    upper        HPI: Chad Wolfe is a 50 y.o. male here today to establish medical care, last seen in clinic w/ NP 11/16.  Since than has had 5 ER vissts for abd pain and other complaints.  Pt currently on 2 ppi and zantac, finished course recently of carafate as well.  However, still c/o of MEG pain which usually occurs at night time when he is lying down.  Cold water, spicy /fried foods irritate his pain, but he admits to dietary indescretions.  For instance, last night he had fried foods late at night, and then woke up w/ severe meg pain.  He has not drank heavy EtOH for months, but admitted to taking a sip of his GFs light beer the other day, which exacerbated his pain as well.  Pt use to be on chronic narcotics for his pain, but since beginning of this year, he has weaned himself off of narcotics (percocets, ultram) and etoh.    Patient has No headache, No chest pain, No abdominal pain currently,  No Nausea, No new weakness tingling or numbness, No Cough - SOB.   Past SHx signficant for Performated Ulcer requiring abd surgery in 1980s.  Recent EGD 11/2007 unremarkable.  CT abd 11/16 showed questionable esophagitis.     Allergies  Allergen Reactions  . Ibuprofen Other (See Comments)    Stomach upset  . Aspirin Other (See Comments)  . Other Other (See Comments)    MD told him not to take [facility] due to Hep C  . Tylenol [Acetaminophen] Other (See Comments)    HEP C   Past Medical History  Diagnosis Date  . Hypertension   . Hyperlipidemia   . Gastric ulcer   . Hepatitis C   . Substance abuse    Current Outpatient Prescriptions on File Prior to Visit  Medication Sig Dispense Refill  . ondansetron (ZOFRAN ODT) 4 MG disintegrating tablet Take 1 tablet (4 mg total) by mouth every 8 (eight) hours as  needed for nausea or vomiting. (Patient not taking: Reported on 10/22/2015) 10 tablet 0   No current facility-administered medications on file prior to visit.   Family History  Problem Relation Age of Onset  . Diabetes Father   . Hypertension Father   . Diabetes Sister   . Hypertension Sister   . Diabetes Brother   . Hypertension Brother    Social History   Social History  . Marital Status: Divorced    Spouse Name: N/A  . Number of Children: N/A  . Years of Education: N/A   Occupational History  . Not on file.   Social History Main Topics  . Smoking status: Current Some Day Smoker  . Smokeless tobacco: Not on file  . Alcohol Use: 0.0 oz/week    0 Standard drinks or equivalent per week     Comment: drank a quart of beer this morning PT DENIES 09/26/15  . Drug Use: No     Comment: oxycodone 2 days ago "my sister gave me one"; no cocaine use  . Sexual Activity: Not on file     Comment: opiates   Other Topics Concern  . Not on file   Social History Narrative    Review of Systems: Constitutional: Negative for fever, chills,  diaphoresis, activity change, appetite change and fatigue. HENT: Negative for ear pain, nosebleeds, congestion, facial swelling, rhinorrhea, neck pain, neck stiffness and ear discharge.  Eyes: Negative for pain, discharge, redness, itching and visual disturbance. Respiratory: Negative for cough, choking, chest tightness, shortness of breath, wheezing and stridor.  Cardiovascular: Negative for chest pain, palpitations and leg swelling. Gastrointestinal: Negative for abdominal distention.  Low abd pains, lower than MEG, especially after fatty/spicy meals prior to bed Genitourinary: Negative for dysuria, urgency, frequency, hematuria, flank pain, decreased urine volume, difficulty urinating and dyspareunia.  Musculoskeletal: Negative for back pain, joint swelling, arthralgia and gait problem. Neurological: Negative for dizziness, tremors, seizures,  syncope, facial asymmetry, speech difficulty, weakness, light-headedness, numbness and headaches.  Hematological: Negative for adenopathy. Does not bruise/bleed easily. Psychiatric/Behavioral: Negative for hallucinations, behavioral problems, confusion, dysphoric mood, decreased concentration and agitation.    Objective:   Filed Vitals:   10/22/15 1207  BP: 131/72  Pulse: 79  Temp: 98 F (36.7 C)    Filed Weights   10/22/15 1207  Weight: 175 lb (79.379 kg)    BP Readings from Last 3 Encounters:  10/22/15 131/72  09/26/15 131/95  09/18/15 112/99    Physical Exam: Constitutional: Patient appears well-developed and well-nourished. No distress. AAOx3 HENT: Normocephalic, atraumatic, External right and left ear normal. Oropharynx is clear and moist.  Eyes: Conjunctivae and EOM are normal. PERRL, no scleral icterus. Neck: Normal ROM. Neck supple. No JVD.  CVS: RRR, S1/S2 +, no murmurs, no gallops, no carotid bruit.  Pulmonary: Effort and breath sounds normal, no stridor, rhonchi, wheezes, rales.  Abdominal: Soft. BS +, mild ttp mid abdomin, below his well healed postsurg midline abd scars., no g/r/palpable masses.  Musculoskeletal: Normal range of motion. No edema and no tenderness.  LE: bilat/ no c/c/e, pulses 2+ bilateral. Neuro: Alert , muscle tone coordination. No cranial nerve deficit grossly. Skin: Skin is warm and dry. No rash noted. Not diaphoretic. No erythema. No pallor. Numerous tattoos. Psychiatric: Normal mood and affect. Behavior, judgment, thought content normal.  Lab Results  Component Value Date   WBC 8.6 09/26/2015   HGB 15.0 09/26/2015   HCT 43.0 09/26/2015   MCV 92.1 09/26/2015   PLT 271 09/26/2015   Lab Results  Component Value Date   CREATININE 0.97 09/26/2015   BUN <5* 09/26/2015   NA 139 09/26/2015   K 3.3* 09/26/2015   CL 107 09/26/2015   CO2 22 09/26/2015    Lab Results  Component Value Date   HGBA1C 6.1* 09/02/2014   Lipid Panel       Component Value Date/Time   CHOL 212* 12/14/2007 2130   TRIG 68 12/14/2007 2130   HDL 45 12/14/2007 2130   CHOLHDL 4.7 Ratio 12/14/2007 2130   VLDL 14 12/14/2007 2130   LDLCALC 153* 12/14/2007 2130       Depression screen PHQ 2/9 10/22/2015 05/05/2015  Decreased Interest 0 0  Down, Depressed, Hopeless 3 3  PHQ - 2 Score 3 3  Altered sleeping 3 3  Tired, decreased energy 0 3  Change in appetite 0 2  Feeling bad or failure about yourself  3 3  Trouble concentrating 0 0  Moving slowly or fidgety/restless 0 3  Suicidal thoughts 0 1  PHQ-9 Score 9 18  Difficult doing work/chores Not difficult at all -    Assessment and plan:   1. Essential hypertension Controlled, encourage low salt diet. - Lipid panel  2. ABDOMINAL PAIN-EPIGASTRIC/lower abd w/  - ?gerd w/ esophogitis  vs pud vs Adhesions pain? From pror surgery   3. Gastroesophageal reflux disease with esophagitis See above - Ambulatory referral to Gastroenterology - possible egd, screening colonoscopy, ?chk hpylori, unable to chk here since on ppi. - renewed carafate. - stopped  Prilosec - keep protonix, zantac made scehduled bid as well - GERD diet instructions, pt advised to be on BLAND diet x 2 wks for any gastritis/ulcers to heal.   4. Chronic hep C - gi referral for f/u.  5. Prediabetes a1c 6.1 in 3/16 - Hemoglobin A1C chk, dm diet/exerise advised for now, pt doesn't want any meds ie metformin that could mess w/ his gut more, which I don't blame him.  Certainly may benefit from low dose lantus, but he's not interested in insulin at this time.   Return in about 2 months (around 12/22/2015).  The patient was given clear instructions to go to ER or return to medical center if symptoms don't improve, worsen or new problems develop. The patient verbalized understanding. The patient was told to call to get lab results if they haven't heard anything in the next week.      Pete Glatterawn T Brook Geraci, MD, MBA/MHA [facility]  [facility] [place], [place] [phone]   10/22/2015, 1:21 PM

## 2015-10-22 NOTE — Patient Instructions (Signed)
Food Choices for Gastroesophageal Reflux Disease, Adult When you have gastroesophageal reflux disease (GERD), the foods you eat and your eating habits are very important. Choosing the right foods can help ease your discomfort.  WHAT GUIDELINES DO I NEED TO FOLLOW?   Choose fruits, vegetables, whole grains, and low-fat dairy products.   Choose low-fat meat, fish, and poultry.  Limit fats such as oils, salad dressings, butter, nuts, and avocado.   Keep a food diary. This helps you identify foods that cause symptoms.   Avoid foods that cause symptoms. These may be different for everyone.   Eat small meals often instead of 3 large meals a day.   Eat your meals slowly, in a place where you are relaxed.   Limit fried foods.   Cook foods using methods other than frying.   Avoid drinking alcohol.   Avoid drinking large amounts of liquids with your meals.   Avoid bending over or lying down until 2-3 hours after eating.  WHAT FOODS ARE NOT RECOMMENDED?  These are some foods and drinks that may make your symptoms worse: Vegetables Tomatoes. Tomato juice. Tomato and spaghetti sauce. Chili peppers. Onion and garlic. Horseradish. Fruits Oranges, grapefruit, and lemon (fruit and juice). Meats High-fat meats, fish, and poultry. This includes hot dogs, ribs, ham, sausage, salami, and bacon. Dairy Whole milk and chocolate milk. Sour cream. Cream. Butter. Ice cream. Cream cheese.  Drinks Coffee and tea. Bubbly (carbonated) drinks or energy drinks. Condiments Hot sauce. Barbecue sauce.  Sweets/Desserts Chocolate and cocoa. Donuts. Peppermint and spearmint. Fats and Oils High-fat foods. This includes [place] fries and potato chips. Other Vinegar. Strong spices. This includes black pepper, white pepper, red pepper, cayenne, curry powder, cloves, ginger, and chili powder. The items listed above may not be a complete list of foods and drinks to avoid. Contact your dietitian for more  information.   This information is not intended to replace advice given to you by your health care provider. Make sure you discuss any questions you have with your health care provider.   Document Released: 11/23/2011 Document Revised: 06/14/2014 Document Reviewed: 03/28/2013 Elsevier Interactive Patient Education 2016 [employer].  - Diabetes Mellitus and Food It is important for you to manage your blood sugar (glucose) level. Your blood glucose level can be greatly affected by what you eat. Eating healthier foods in the appropriate amounts throughout the day at about the same time each day will help you control your blood glucose level. It can also help slow or prevent worsening of your diabetes mellitus. Healthy eating may even help you improve the level of your blood pressure and reach or maintain a healthy weight.  General recommendations for healthful eating and cooking habits include:  Eating meals and snacks regularly. Avoid going long periods of time without eating to lose weight.  Eating a diet that consists mainly of plant-based foods, such as fruits, vegetables, nuts, legumes, and whole grains.  Using low-heat cooking methods, such as baking, instead of high-heat cooking methods, such as deep frying. Work with your dietitian to make sure you understand how to use the Nutrition Facts information on food labels. HOW CAN FOOD AFFECT ME? Carbohydrates Carbohydrates affect your blood glucose level more than any other type of food. Your dietitian will help you determine how many carbohydrates to eat at each meal and teach you how to count carbohydrates. Counting carbohydrates is important to keep your blood glucose at a healthy level, especially if you are using insulin or taking certain  medicines for diabetes mellitus. Alcohol Alcohol can cause sudden decreases in blood glucose (hypoglycemia), especially if you use insulin or take certain medicines for diabetes mellitus. Hypoglycemia  can be a life-threatening condition. Symptoms of hypoglycemia (sleepiness, dizziness, and disorientation) are similar to symptoms of having too much alcohol.  If your health care provider has given you approval to drink alcohol, do so in moderation and use the following guidelines:  Women should not have more than one drink per day, and men should not have more than two drinks per day. One drink is equal to:  12 oz of beer.  5 oz of wine.  1 oz of hard liquor.  Do not drink on an empty stomach.  Keep yourself hydrated. Have water, diet soda, or unsweetened iced tea.  Regular soda, juice, and other mixers might contain a lot of carbohydrates and should be counted. WHAT FOODS ARE NOT RECOMMENDED? As you make food choices, it is important to remember that all foods are not the same. Some foods have fewer nutrients per serving than other foods, even though they might have the same number of calories or carbohydrates. It is difficult to get your body what it needs when you eat foods with fewer nutrients. Examples of foods that you should avoid that are high in calories and carbohydrates but low in nutrients include:  Trans fats (most processed foods list trans fats on the Nutrition Facts label).  Regular soda.  Juice.  Candy.  Sweets, such as cake, pie, doughnuts, and cookies.  Fried foods. WHAT FOODS CAN I EAT? Eat nutrient-rich foods, which will nourish your body and keep you healthy. The food you should eat also will depend on several factors, including:  The calories you need.  The medicines you take.  Your weight.  Your blood glucose level.  Your blood pressure level.  Your cholesterol level. You should eat a variety of foods, including:  Protein.  Lean cuts of meat.  Proteins low in saturated fats, such as fish, egg whites, and beans. Avoid processed meats.  Fruits and vegetables.  Fruits and vegetables that may help control blood glucose levels, such as  apples, mangoes, and yams.  Dairy products.  Choose fat-free or low-fat dairy products, such as milk, yogurt, and cheese.  Grains, bread, pasta, and rice.  Choose whole grain products, such as multigrain bread, whole oats, and brown rice. These foods may help control blood pressure.  Fats.  Foods containing healthful fats, such as nuts, avocado, olive oil, canola oil, and fish. DOES EVERYONE WITH DIABETES MELLITUS HAVE THE SAME MEAL PLAN? Because every person with diabetes mellitus is different, there is not one meal plan that works for everyone. It is very important that you meet with a dietitian who will help you create a meal plan that is just right for you.   This information is not intended to replace advice given to you by your health care provider. Make sure you discuss any questions you have with your health care provider.   Document Released: 02/18/2005 Document Revised: 06/14/2014 Document Reviewed: 04/20/2013 Elsevier Interactive Patient Education 2016 Elsevier Inc.   Prediabetes/// Type 2 Diabetes Mellitus, Adult Type 2 diabetes mellitus is a long-term (chronic) disease. In type 2 diabetes:  The pancreas does not make enough of a hormone called insulin.  The cells in the body do not respond as well to the insulin that is made.  Both of the above can happen. Normally, insulin moves sugars from food into tissue cells.  This gives you energy. If you have type 2 diabetes, sugars cannot be moved into tissue cells. This causes high blood sugar (hyperglycemia).  Your doctors will set personal treatment goals for you based on your age, your medicines, how long you have had diabetes, and any other medical conditions you have. Generally, the goal of treatment is to maintain the following blood glucose levels:  Before meals (preprandial): 80-130 mg/dL.  After meals (postprandial): below 180 mg/dL.  A1c: less than 6.5-7%. HOME CARE  Have your hemoglobin A1c level checked twice  a year. The level shows if your diabetes is under control or out of control.  Test your blood sugar level every day as told by your doctor.  Check your ketone levels by testing your pee (urine) when you are sick and as told.  Take your diabetes or insulin medicine as told by your doctor.  Never run out of insulin.  Adjust how much insulin you give yourself based on how many carbs (carbohydrates) you eat. Carbs are in many foods, such as fruits, vegetables, whole grains, and dairy products.  Have a healthy snack between every healthy meal. Have 3 meals and 3 snacks a day.  Lose weight if you are overweight.  Carry a medical alert card or wear your medical alert jewelry.  Carry a 15-gram carb snack with you at all times. Examples include:  Glucose pills, 3 or 4.  Glucose gel, 15-gram tube.  Raisins, 2 tablespoons (24 grams).  Jelly beans, 6.  Animal crackers, 8.  Regular (not diet) pop, 4 ounces (120 milliliters).  Gummy treats, 9.  Notice low blood sugar (hypoglycemia) symptoms, such as:  Shaking (tremors).  Trouble thinking clearly.  Sweating.  Faster heart rate.  Headache.  Dry mouth.  Hunger.  Crabbiness (irritability).  Being worried or tense (anxious).  Restless sleep.  A change in speech or coordination.  Confusion.  Treat low blood sugar right away. If you are alert and can swallow, follow the 15:15 rule:  Take 15-20 grams of a rapid-acting glucose or carb. This includes glucose gel, glucose pills, or 4 ounces (120 milliliters) of fruit juice, regular pop, or low-fat milk.  Check your blood sugar level 15 minutes after taking the glucose.  Take 15-20 grams more of glucose if the repeat blood sugar level is still 70 mg/dL (milligrams/deciliter) or below.  Eat a meal or snack within 1 hour of the blood sugar levels going back to normal.  Notice early symptoms of high blood sugar, such as:  Being really thirsty or drinking a lot  (polydipsia).  Peeing a lot (polyuria).  Do at least 150 minutes of physical activity a week or as told.  Split the 150 minutes of activity up during the week. Do not do 150 minutes of activity in one day.  Perform exercises, such as weight lifting, at least 2 times a week or as told.  Spend no more than 90 minutes at one time inactive.  Adjust your insulin or food intake as needed if you start a new exercise or sport.  Follow your sick-day plan when you are not able to eat or drink as usual.  Do not smoke, chew tobacco, or use electronic cigarettes.  Women who are not pregnant should drink no more than 1 drink a day. Men should drink no more than 2 drinks a day.  Only drink alcohol with food.  Ask your doctor if alcohol is safe for you.  Tell your doctor if you drink alcohol  several times during the week.  See your doctor regularly.  Schedule an eye exam soon after you are told you have diabetes. Schedule exams once every year.  Check your skin and feet every day. Check for cuts, bruises, redness, nail problems, bleeding, blisters, or sores. A doctor should do a foot exam once a year.  Brush your teeth and gums twice a day. Floss once a day. Visit your dentist regularly.  Share your diabetes plan with your workplace or school.  Keep your shots that fight diseases (vaccines) up to date.  Get a flu (influenza) shot every year.  Get a pneumonia shot. If you are 70 years of age or older and you have never gotten a pneumonia shot, you might need to get two shots.  Ask your doctor which other shots you should get.  Learn how to deal with stress.  Get diabetes education and support as needed.  Ask your doctor for special help if:  You need help to maintain or improve how you do things on your own.  You need help to maintain or improve the quality of your life.  You have foot or hand problems.  You have trouble cleaning yourself, dressing, eating, or doing physical  activity. GET HELP IF:  You are unable to eat or drink for more than 6 hours.  You feel sick to your stomach (nauseous) or throw up (vomit) for more than 6 hours.  Your blood sugar level is over 240 mg/dL.  There is a change in mental status.  You get another serious illness.  You have watery poop (diarrhea) for more than 6 hours.  You have been sick or have had a fever for 2 or more days and are not getting better.  You have pain when you are active. GET HELP RIGHT AWAY IF:  You have trouble breathing.  Your ketone levels are higher than your doctor says they should be. MAKE SURE YOU:  Understand these instructions.  Will watch your condition.  Will get help right away if you are not doing well or get worse.   This information is not intended to replace advice given to you by your health care provider. Make sure you discuss any questions you have with your health care provider.   Document Released: 03/02/2008 Document Revised: 10/08/2014 Document Reviewed: 12/24/2011 Elsevier Interactive Patient Education [employer]

## 2015-10-23 LAB — HEMOGLOBIN A1C
HEMOGLOBIN A1C: 6.8 % — AB (ref <5.7)
Mean Plasma Glucose: 148 mg/dL

## 2015-10-29 ENCOUNTER — Other Ambulatory Visit: Payer: Self-pay | Admitting: Internal Medicine

## 2015-11-04 ENCOUNTER — Other Ambulatory Visit: Payer: Self-pay | Admitting: Internal Medicine

## 2015-11-05 NOTE — Telephone Encounter (Signed)
Please call pt/  He needs to feel out [facility] asap and see GI for egd asap.  i will refill carafate, but recd above.

## 2015-11-06 ENCOUNTER — Other Ambulatory Visit: Payer: Self-pay | Admitting: Internal Medicine

## 2015-11-06 MED ORDER — SIMVASTATIN 20 MG PO TABS: 20.0000 mg | ORAL_TABLET | Freq: Every day | ORAL | Status: DC

## 2015-11-06 MED ORDER — METFORMIN HCL 500 MG PO TABS: 500.0000 mg | ORAL_TABLET | Freq: Two times a day (BID) | ORAL | Status: DC

## 2015-11-06 MED FILL — metFORMIN HCL 500 MG TABS: 500 | 30 days supply | Qty: 60 | Fill #0

## 2015-11-06 MED FILL — SIMVASTATIN 20 MG TABLET: 20 | 30 days supply | Qty: 30 | Fill #0

## 2015-11-10 ENCOUNTER — Telehealth: Payer: Self-pay | Admitting: Internal Medicine

## 2015-11-10 NOTE — Telephone Encounter (Signed)
Pt. Came into facility stating that his PCP had called him and stated that he had DM. Pt. Looked confused and stated that his PCP was going to return his call. Please f/u with pt.

## 2015-11-11 NOTE — Telephone Encounter (Signed)
Pt has been advsd of his lab results and recommendations.

## 2015-11-13 ENCOUNTER — Telehealth: Payer: Self-pay | Admitting: Internal Medicine

## 2015-11-13 NOTE — Telephone Encounter (Signed)
Pt says that his medication, metFORMIN (GLUCOPHAGE) 500 MG tablet , is causing stomach pain. Pt would like advice on if he should continue to take his medication or stop Please assist. Thank you

## 2015-11-13 NOTE — Telephone Encounter (Signed)
Will forward message to PCP.

## 2015-11-14 ENCOUNTER — Ambulatory Visit: Payer: No Typology Code available for payment source

## 2015-11-17 NOTE — Telephone Encounter (Signed)
Please call pt/ Pt should stop metformin if it is causing stomach upset. Thanks.

## 2015-11-18 MED FILL — ?LISINOPRIL 5 MG TABLET: 5 | 30 days supply | Qty: 30 | Fill #0

## 2015-11-20 MED FILL — raNITIdine HCL 150 MG TABS: 150 | 30 days supply | Qty: 60 | Fill #1

## 2015-11-25 ENCOUNTER — Encounter (HOSPITAL_COMMUNITY): Payer: Self-pay | Admitting: Emergency Medicine

## 2015-11-25 ENCOUNTER — Emergency Department (HOSPITAL_COMMUNITY)
Admission: EM | Admit: 2015-11-25 | Discharge: 2015-11-25 | Disposition: A | Discharge status: Home / Self Care | Payer: No Typology Code available for payment source | Attending: Emergency Medicine | Admitting: Emergency Medicine

## 2015-11-25 DIAGNOSIS — Z7984 Long term (current) use of oral hypoglycemic drugs: Secondary | ICD-10-CM | POA: Insufficient documentation

## 2015-11-25 DIAGNOSIS — Z202 Contact with and (suspected) exposure to infections with a predominantly sexual mode of transmission: Secondary | ICD-10-CM

## 2015-11-25 DIAGNOSIS — E119 Type 2 diabetes mellitus without complications: Secondary | ICD-10-CM | POA: Insufficient documentation

## 2015-11-25 DIAGNOSIS — Z79899 Other long term (current) drug therapy: Secondary | ICD-10-CM | POA: Insufficient documentation

## 2015-11-25 DIAGNOSIS — I10 Essential (primary) hypertension: Secondary | ICD-10-CM | POA: Insufficient documentation

## 2015-11-25 DIAGNOSIS — E785 Hyperlipidemia, unspecified: Secondary | ICD-10-CM | POA: Insufficient documentation

## 2015-11-25 DIAGNOSIS — F17200 Nicotine dependence, unspecified, uncomplicated: Secondary | ICD-10-CM | POA: Insufficient documentation

## 2015-11-25 LAB — URINALYSIS, ROUTINE W REFLEX MICROSCOPIC
BILIRUBIN URINE: NEGATIVE
GLUCOSE, UA: 250 mg/dL — AB
HGB URINE DIPSTICK: NEGATIVE
KETONES UR: 15 mg/dL — AB
Leukocytes, UA: NEGATIVE
Nitrite: NEGATIVE
PH: 5.5 (ref 5.0–8.0)
PROTEIN: NEGATIVE mg/dL
Specific Gravity, Urine: 1.027 (ref 1.005–1.030)

## 2015-11-25 LAB — HIV ANTIBODY (ROUTINE TESTING W REFLEX): HIV SCREEN 4TH GENERATION: NONREACTIVE

## 2015-11-25 LAB — RPR: RPR: NONREACTIVE

## 2015-11-25 MED ORDER — CEFTRIAXONE SODIUM 250 MG IJ SOLR
250.0000 mg | Freq: Once | INTRAMUSCULAR | Status: AC
  Administered 2015-11-25: 250 mg via INTRAMUSCULAR
  Filled 2015-11-25: qty 250

## 2015-11-25 MED ORDER — AZITHROMYCIN 250 MG PO TABS
1000.0000 mg | ORAL_TABLET | Freq: Once | ORAL | Status: AC
  Administered 2015-11-25: 1000 mg via ORAL
  Filled 2015-11-25: qty 4

## 2015-11-25 MED ORDER — METRONIDAZOLE 500 MG PO TABS
2000.0000 mg | ORAL_TABLET | Freq: Once | ORAL | Status: AC
  Administered 2015-11-25: 2000 mg via ORAL
  Filled 2015-11-25: qty 4

## 2015-11-25 NOTE — Discharge Instructions (Signed)
You have been seen today for possible exposure to a STD. Your urinalysis showed no abnormalities. There are still lab results pending. Any abnormal results will be called to you. You have been treated for potential infections while here in the ED. This includes prophylactic treatment for gonorrhea, chlamydia, and trichomonas. Follow up with PCP as needed.

## 2015-11-25 NOTE — ED Notes (Signed)
Pt sts here for STD exposure; pt denies complaint

## 2015-11-25 NOTE — ED Provider Notes (Signed)
CSN: [id number]     Arrival date & time 11/25/15  0809 History   None    Chief Complaint  Patient presents with  . Exposure to STD     (Consider location/radiation/quality/duration/timing/severity/associated sxs/prior Treatment) HPI   Chad Wolfe is a 50 y.o. male, with a history of hepatitis C and substance abuse, presenting to the ED with Evaluation following a possible STD exposure. Patient is sexually active with at least 2 women and does not use barrier protection. One of the women was recently treated for PID on June 11. Last sexual contact was yesterday. Endorses some possible dysuria. Patient denies fever/chills, abdominal pain, nausea/vomiting, pain with bowel movements, penile discharge, or any other complaints.    Past Medical History  Diagnosis Date  . Hypertension   . Hyperlipidemia   . Gastric ulcer   . Hepatitis C   . Substance abuse    Past Surgical History  Procedure Laterality Date  . Stomach surgery     Family History  Problem Relation Age of Onset  . Diabetes Father   . Hypertension Father   . Diabetes Sister   . Hypertension Sister   . Diabetes Brother   . Hypertension Brother    Social History  Substance Use Topics  . Smoking status: Current Some Day Smoker  . Smokeless tobacco: None  . Alcohol Use: 0.0 oz/week    0 Standard drinks or equivalent per week     Comment: drank a quart of beer this morning PT DENIES 09/26/15    Review of Systems  Constitutional: Negative for fever and chills.  Gastrointestinal: Negative for nausea, vomiting and abdominal pain.  Genitourinary: Positive for dysuria. Negative for flank pain, discharge, penile swelling, scrotal swelling, genital sores, penile pain and testicular pain.  All other systems reviewed and are negative.     Allergies  Ibuprofen; Aspirin; Other; and Tylenol  Home Medications   Prior to Admission medications   Medication Sig Start Date End Date Taking? Authorizing Provider   CARAFATE 1 GM/10ML suspension TAKE 10 MLS BY MOUTH 4 TIMES DAILY - WITH MEALS AND AT BEDTIME. 11/05/15   Pete Glatter, MD  lisinopril (PRINIVIL,ZESTRIL) 5 MG tablet Take 1 tablet (5 mg total) by mouth daily. No more refills - must have OV 10/22/15   Pete Glatter, MD  metFORMIN (GLUCOPHAGE) 500 MG tablet Take 1 tablet (500 mg total) by mouth 2 (two) times daily with a meal. 11/06/15   Pete Glatter, MD  ondansetron (ZOFRAN ODT) 4 MG disintegrating tablet Take 1 tablet (4 mg total) by mouth every 8 (eight) hours as needed for nausea or vomiting. Patient not taking: Reported on 10/22/2015 05/07/15   Barrett Henle, PA-C  pantoprazole (PROTONIX) 20 MG tablet Take 1 tablet (20 mg total) by mouth 2 (two) times daily. 10/22/15   Pete Glatter, MD  ranitidine (ZANTAC) 150 MG tablet Take 1 tablet (150 mg total) by mouth 2 (two) times daily. 10/22/15   Pete Glatter, MD  simvastatin (ZOCOR) 20 MG tablet Take 1 tablet (20 mg total) by mouth at bedtime. 11/06/15   Pete Glatter, MD   BP 115/74 mmHg  Pulse 99  Temp(Src) 98.4 F (36.9 C) (Oral)  Resp 18  SpO2 98% Physical Exam  Constitutional: He appears well-developed and well-nourished. No distress.  HENT:  Head: Normocephalic and atraumatic.  Eyes: Conjunctivae are normal.  Neck: Neck supple.  Cardiovascular: Normal rate, regular rhythm and intact distal pulses.   Pulmonary/Chest:  Effort normal. No respiratory distress.  Abdominal: Soft. There is no tenderness. There is no guarding.  Genitourinary:  Penis, scrotum, and testicles without swelling, lesions, or tenderness. No penile discharge. Overall normal male genitalia.   Musculoskeletal: He exhibits no edema.  Lymphadenopathy:       Right: No inguinal adenopathy present.       Left: No inguinal adenopathy present.  Neurological: He is alert.  Skin: Skin is warm and dry. He is not diaphoretic.  Psychiatric: He has a normal mood and affect. His behavior is normal.  Nursing  note and vitals reviewed.   ED Course  Procedures (including critical care time) Labs Review Labs Reviewed  URINALYSIS, ROUTINE W REFLEX MICROSCOPIC (NOT AT [facility]) - Abnormal; Notable for the following:    Glucose, UA 250 (*)    Ketones, ur 15 (*)    All other components within normal limits  URINE CULTURE  RPR  HIV ANTIBODY (ROUTINE TESTING)  GC/CHLAMYDIA PROBE AMP ([place]) NOT AT [facility]    I have personally reviewed and evaluated these lab results as part of my medical decision-making.   EKG Interpretation None      MDM   Final diagnoses:  Potential exposure to STD    Chad Wolfe presents for evaluation following possible STD exposure.  Patient has no red flag symptoms. No external signs of STD. UA shows no signs of infection. Patient prophylactically treated for gonorrhea, chlamydia, and trichomonas. Safe sex practices and return precautions discussed. Patient voiced understanding of these instructions and is comfortable with discharge.  Glucose and ketones in the patient's urine are noted. Patient is a new diabetic and is in the process of getting his diabetes under control. Patient has no symptoms of hyper or hypoglycemia.  Anselm PancoastShawn C James Lafalce, PA-C 11/25/15 0931  Jacalyn LefevreJulie Haviland, MD 11/25/15 [phone]

## 2015-11-26 ENCOUNTER — Ambulatory Visit: Payer: No Typology Code available for payment source | Attending: Internal Medicine

## 2015-11-26 LAB — URINE CULTURE: CULTURE: NO GROWTH

## 2015-11-26 LAB — GC/CHLAMYDIA PROBE AMP (~~LOC~~) NOT AT ARMC
Chlamydia: NEGATIVE
Neisseria Gonorrhea: NEGATIVE

## 2015-11-26 NOTE — Telephone Encounter (Signed)
Clld pt - advsd of PCP's notation. Patient asked if there is an alternative for him to take. Pls advise.

## 2015-11-29 NOTE — Addendum Note (Signed)
Addended byDierdre Searles: Yelina Sarratt T on: 11/29/2015 06:58 AM   Modules accepted: Orders, Medications

## 2015-11-29 NOTE — Telephone Encounter (Signed)
Other diabetic pills, such as glyburide and glipizide also cause some stomach upset. Next step would be injection, such as victoza. We can talk more at next appt.

## 2015-12-01 NOTE — Telephone Encounter (Signed)
Clld pt  - LMOVMTC re diabetes medication.

## 2015-12-03 NOTE — Telephone Encounter (Signed)
Clld pt - advsd of PCP notation. Pt stated he understood and will wait to discuss options at his next office visit.

## 2015-12-16 MED FILL — ?LISINOPRIL 5 MG TABLET: 5 | 30 days supply | Qty: 30 | Fill #1

## 2015-12-22 ENCOUNTER — Ambulatory Visit: Payer: No Typology Code available for payment source | Admitting: Internal Medicine

## 2016-01-12 MED FILL — ?SIMVASTATIN 20 MG TABLET: 20 MG | 30 days supply | Qty: 30 | Fill #1

## 2016-01-12 MED FILL — ?LISINOPRIL 5 MG TABLET: 5 | 30 days supply | Qty: 30 | Fill #2

## 2016-01-21 ENCOUNTER — Ambulatory Visit: Payer: No Typology Code available for payment source | Attending: Internal Medicine | Admitting: Internal Medicine

## 2016-01-21 ENCOUNTER — Encounter: Payer: Self-pay | Admitting: Internal Medicine

## 2016-01-21 VITALS — BP 118/81 | HR 71 | Temp 98.2°F | Resp 16 | Wt 172.2 lb

## 2016-01-21 DIAGNOSIS — I10 Essential (primary) hypertension: Secondary | ICD-10-CM | POA: Insufficient documentation

## 2016-01-21 DIAGNOSIS — F17210 Nicotine dependence, cigarettes, uncomplicated: Secondary | ICD-10-CM | POA: Insufficient documentation

## 2016-01-21 DIAGNOSIS — E119 Type 2 diabetes mellitus without complications: Secondary | ICD-10-CM

## 2016-01-21 DIAGNOSIS — E785 Hyperlipidemia, unspecified: Secondary | ICD-10-CM | POA: Insufficient documentation

## 2016-01-21 DIAGNOSIS — B182 Chronic viral hepatitis C: Secondary | ICD-10-CM | POA: Insufficient documentation

## 2016-01-21 HISTORY — DX: Type 2 diabetes mellitus without complications: E11.9

## 2016-01-21 LAB — POCT GLYCOSYLATED HEMOGLOBIN (HGB A1C): Hemoglobin A1C: 6.8

## 2016-01-21 NOTE — Progress Notes (Signed)
Pt is in the office today for a follow up Pt states he is not in any pain today

## 2016-01-21 NOTE — Patient Instructions (Signed)
DASH Eating Plan DASH stands for "Dietary Approaches to Stop Hypertension." The DASH eating plan is a healthy eating plan that has been shown to reduce high blood pressure (hypertension). Additional health benefits may include reducing the risk of type 2 diabetes mellitus, heart disease, and stroke. The DASH eating plan may also help with weight loss. WHAT DO I NEED TO KNOW ABOUT THE DASH EATING PLAN? For the DASH eating plan, you will follow these general guidelines:  Choose foods with a percent daily value for sodium of less than 5% (as listed on the food label).  Use salt-free seasonings or herbs instead of table salt or sea salt.  Check with your health care provider or pharmacist before using salt substitutes.  Eat lower-sodium products, often labeled as "lower sodium" or "no salt added."  Eat fresh foods.  Eat more vegetables, fruits, and low-fat dairy products.  Choose whole grains. Look for the word "whole" as the first word in the ingredient list.  Choose fish and skinless chicken or turkey more often than red meat. Limit fish, poultry, and meat to 6 oz (170 g) each day.  Limit sweets, desserts, sugars, and sugary drinks.  Choose heart-healthy fats.  Limit cheese to 1 oz (28 g) per day.  Eat more home-cooked food and less restaurant, buffet, and fast food.  Limit fried foods.  Cook foods using methods other than frying.  Limit canned vegetables. If you do use them, rinse them well to decrease the sodium.  When eating at a restaurant, ask that your food be prepared with less salt, or no salt if possible. WHAT FOODS CAN I EAT? Seek help from a dietitian for individual calorie needs. Grains Whole grain or whole wheat bread. Brown rice. Whole grain or whole wheat pasta. Quinoa, bulgur, and whole grain cereals. Low-sodium cereals. Corn or whole wheat flour tortillas. Whole grain cornbread. Whole grain crackers. Low-sodium crackers. Vegetables Fresh or frozen vegetables  (raw, steamed, roasted, or grilled). Low-sodium or reduced-sodium tomato and vegetable juices. Low-sodium or reduced-sodium tomato sauce and paste. Low-sodium or reduced-sodium canned vegetables.  Fruits All fresh, canned (in natural juice), or frozen fruits. Meat and Other Protein Products Ground beef (85% or leaner), grass-fed beef, or beef trimmed of fat. Skinless chicken or turkey. Ground chicken or turkey. Pork trimmed of fat. All fish and seafood. Eggs. Dried beans, peas, or lentils. Unsalted nuts and seeds. Unsalted canned beans. Dairy Low-fat dairy products, such as skim or 1% milk, 2% or reduced-fat cheeses, low-fat ricotta or cottage cheese, or plain low-fat yogurt. Low-sodium or reduced-sodium cheeses. Fats and Oils Tub margarines without trans fats. Light or reduced-fat mayonnaise and salad dressings (reduced sodium). Avocado. Safflower, olive, or canola oils. Natural peanut or almond butter. Other Unsalted popcorn and pretzels. The items listed above may not be a complete list of recommended foods or beverages. Contact your dietitian for more options. WHAT FOODS ARE NOT RECOMMENDED? Grains White bread. White pasta. White rice. Refined cornbread. Bagels and croissants. Crackers that contain trans fat. Vegetables Creamed or fried vegetables. Vegetables in a cheese sauce. Regular canned vegetables. Regular canned tomato sauce and paste. Regular tomato and vegetable juices. Fruits Dried fruits. Canned fruit in light or heavy syrup. Fruit juice. Meat and Other Protein Products Fatty cuts of meat. Ribs, chicken wings, bacon, sausage, bologna, salami, chitterlings, fatback, hot dogs, bratwurst, and packaged luncheon meats. Salted nuts and seeds. Canned beans with salt. Dairy Whole or 2% milk, cream, half-and-half, and cream cheese. Whole-fat or sweetened yogurt. Full-fat   cheeses or blue cheese. Nondairy creamers and whipped toppings. Processed cheese, cheese spreads, or cheese  curds. Condiments Onion and garlic salt, seasoned salt, table salt, and sea salt. Canned and packaged gravies. Worcestershire sauce. Tartar sauce. Barbecue sauce. Teriyaki sauce. Soy sauce, including reduced sodium. Steak sauce. Fish sauce. Oyster sauce. Cocktail sauce. Horseradish. Ketchup and mustard. Meat flavorings and tenderizers. Bouillon cubes. Hot sauce. Tabasco sauce. Marinades. Taco seasonings. Relishes. Fats and Oils Butter, stick margarine, lard, shortening, ghee, and bacon fat. Coconut, palm kernel, or palm oils. Regular salad dressings. Other Pickles and olives. Salted popcorn and pretzels. The items listed above may not be a complete list of foods and beverages to avoid. Contact your dietitian for more information. WHERE CAN I FIND MORE INFORMATION? National Heart, Lung, and Blood Institute: travelstabloid.com   This information is not intended to replace advice given to you by your health care provider. Make sure you discuss any questions you have with your health care provider.   Document Released: 05/13/2011 Document Revised: 06/14/2014 Document Reviewed: 03/28/2013 Elsevier Interactive Patient Education 2016 [employer]   - Diabetes Mellitus and Food It is important for you to manage your blood sugar (glucose) level. Your blood glucose level can be greatly affected by what you eat. Eating healthier foods in the appropriate amounts throughout the day at about the same time each day will help you control your blood glucose level. It can also help slow or prevent worsening of your diabetes mellitus. Healthy eating may even help you improve the level of your blood pressure and reach or maintain a healthy weight.  General recommendations for healthful eating and cooking habits include:  Eating meals and snacks regularly. Avoid going long periods of time without eating to lose weight.  Eating a diet that consists mainly of plant-based foods,  such as fruits, vegetables, nuts, legumes, and whole grains.  Using low-heat cooking methods, such as baking, instead of high-heat cooking methods, such as deep frying. Work with your dietitian to make sure you understand how to use the Nutrition Facts information on food labels. HOW CAN FOOD AFFECT ME? Carbohydrates Carbohydrates affect your blood glucose level more than any other type of food. Your dietitian will help you determine how many carbohydrates to eat at each meal and teach you how to count carbohydrates. Counting carbohydrates is important to keep your blood glucose at a healthy level, especially if you are using insulin or taking certain medicines for diabetes mellitus. Alcohol Alcohol can cause sudden decreases in blood glucose (hypoglycemia), especially if you use insulin or take certain medicines for diabetes mellitus. Hypoglycemia can be a life-threatening condition. Symptoms of hypoglycemia (sleepiness, dizziness, and disorientation) are similar to symptoms of having too much alcohol.  If your health care provider has given you approval to drink alcohol, do so in moderation and use the following guidelines:  Women should not have more than one drink per day, and men should not have more than two drinks per day. One drink is equal to:  12 oz of beer.  5 oz of wine.  1 oz of hard liquor.  Do not drink on an empty stomach.  Keep yourself hydrated. Have water, diet soda, or unsweetened iced tea.  Regular soda, juice, and other mixers might contain a lot of carbohydrates and should be counted. WHAT FOODS ARE NOT RECOMMENDED? As you make food choices, it is important to remember that all foods are not the same. Some foods have fewer nutrients per serving than other foods,  though they might have the same number of calories or carbohydrates. It is difficult to get your body what it needs when you eat foods with fewer nutrients. Examples of foods that you should avoid that are  high in calories and carbohydrates but low in nutrients include:  Trans fats (most processed foods list trans fats on the Nutrition Facts label).  Regular soda.  Juice.  Candy.  Sweets, such as cake, pie, doughnuts, and cookies.  Fried foods. WHAT FOODS CAN I EAT? Eat nutrient-rich foods, which will nourish your body and keep you healthy. The food you should eat also will depend on several factors, including:  The calories you need.  The medicines you take.  Your weight.  Your blood glucose level.  Your blood pressure level.  Your cholesterol level. You should eat a variety of foods, including:  Protein.  Lean cuts of meat.  Proteins low in saturated fats, such as fish, egg whites, and beans. Avoid processed meats.  Fruits and vegetables.  Fruits and vegetables that may help control blood glucose levels, such as apples, mangoes, and yams.  Dairy products.  Choose fat-free or low-fat dairy products, such as milk, yogurt, and cheese.  Grains, bread, pasta, and rice.  Choose whole grain products, such as multigrain bread, whole oats, and brown rice. These foods may help control blood pressure.  Fats.  Foods containing healthful fats, such as nuts, avocado, olive oil, canola oil, and fish. DOES EVERYONE WITH DIABETES MELLITUS HAVE THE SAME MEAL PLAN? Because every person with diabetes mellitus is different, there is not one meal plan that works for everyone. It is very important that you meet with a dietitian who will help you create a meal plan that is just right for you.   This information is not intended to replace advice given to you by your health care provider. Make sure you discuss any questions you have with your health care provider.   Document Released: 02/18/2005 Document Revised: 06/14/2014 Document Reviewed: 04/20/2013 Elsevier Interactive Patient Education 2016 [employer] Inc[employer] - Tips for Eating Away From Home If You Have Diabetes Controlling your  level of blood glucose, also known as blood sugar, can be challenging. It can be even more difficult when you do not prepare your own meals. The following tips can help you manage your diabetes when you eat away from home. PLANNING AHEAD Plan ahead if you know you will be eating away from home:  Ask your health care provider how to time meals and medicine if you are taking insulin.  Make a list of restaurants near you that offer healthy choices. If they have a carry-out menu, take it home and plan what you will order ahead of time.  Look up the restaurant you want to eat at online. Many chain and fast-food restaurants list nutritional information online. Use this information to choose the healthiest options and to calculate how many carbohydrates will be in your meal.  Use a carbohydrate-counting book or mobile app to look up the carbohydrate content and serving size of the foods you want to eat.  Become familiar with serving sizes and learn to recognize how many servings are in a portion. This will allow you to estimate how many carbohydrates you can eat. FREE FOODS A "free food" is any food or drink that has less than 5 g of carbohydrates per serving. Free foods include:  Many vegetables.  Hard boiled eggs.  Nuts or seeds.  Olives.  Cheeses.  Meats. These types   of foods make good appetizer choices and are often available at salad bars. Lemon juice, vinegar, or a low-calorie salad dressing of fewer than 20 calories per serving can be used as a "free" salad dressing.  CHOICES TO REDUCE CARBOHYDRATES  Substitute nonfat sweetened yogurt with a sugar-free yogurt. Yogurt made from soy milk may also be used, but you will still want a sugar-free or plain option to choose a lower carbohydrate amount.  Ask your server to take away the bread basket or chips from your table.  Order fresh fruit. A salad bar often offers fresh fruit choices. Avoid canned fruit because it is usually packed in  sugar or syrup.  Order a salad, and eat it without dressing. Or, create a "free" salad dressing.  Ask for substitutions. For example, instead of French fries, request an order of a vegetable such as salad, green beans, or broccoli. OTHER TIPS   If you take insulin, take the insulin once your food arrives to your table. This will ensure your insulin and food are timed correctly.  Ask your server about the portion size before your order, and ask for a take-out box if the portion has more servings than you should have. When your food comes, leave the amount you should have on the plate, and put the rest in the take-out box.  Consider splitting an entree with someone and ordering a side salad.   This information is not intended to replace advice given to you by your health care provider. Make sure you discuss any questions you have with your health care provider.   Document Released: 05/24/2005 Document Revised: 02/12/2015 Document Reviewed: 08/21/2013 Elsevier Interactive Patient Education 2016 [employer] Inc.  - Diabetes and Exercise Exercising regularly is important. It is not just about losing weight. It has many health benefits, such as:  Improving your overall fitness, flexibility, and endurance.  Increasing your bone density.  Helping with weight control.  Decreasing your body fat.  Increasing your muscle strength.  Reducing stress and tension.  Improving your overall health. People with diabetes who exercise gain additional benefits because exercise:  Reduces appetite.  Improves the body's use of blood sugar (glucose).  Helps lower or control blood glucose.  Decreases blood pressure.  Helps control blood lipids (such as cholesterol and triglycerides).  Improves the body's use of the hormone insulin by:  Increasing the body's insulin sensitivity.  Reducing the body's insulin needs.  Decreases the risk for heart disease because exercising:  Lowers cholesterol  and triglycerides levels.  Increases the levels of good cholesterol (such as high-density lipoproteins [HDL]) in the body.  Lowers blood glucose levels. YOUR ACTIVITY PLAN  Choose an activity that you enjoy, and set realistic goals. To exercise safely, you should begin practicing any new physical activity slowly, and gradually increase the intensity of the exercise over time. Your health care provider or diabetes educator can help create an activity plan that works for you. General recommendations include:  Encouraging children to engage in at least 60 minutes of physical activity each day.  Stretching and performing strength training exercises, such as yoga or weight lifting, at least 2 times per week.  Performing a total of at least 150 minutes of moderate-intensity exercise each week, such as brisk walking or water aerobics.  Exercising at least 3 days per week, making sure you allow no more than 2 consecutive days to pass without exercising.  Avoiding long periods of inactivity (90 minutes or more). When you have to spend   an extended period of time sitting down, take frequent breaks to walk or stretch. RECOMMENDATIONS FOR EXERCISING WITH TYPE 1 OR TYPE 2 DIABETES   Check your blood glucose before exercising. If blood glucose levels are greater than 240 mg/dL, check for urine ketones. Do not exercise if ketones are present.  Avoid injecting insulin into areas of the body that are going to be exercised. For example, avoid injecting insulin into:  The arms when playing tennis.  The legs when jogging.  Keep a record of:  Food intake before and after you exercise.  Expected peak times of insulin action.  Blood glucose levels before and after you exercise.  The type and amount of exercise you have done.  Review your records with your health care provider. Your health care provider will help you to develop guidelines for adjusting food intake and insulin amounts before and after  exercising.  If you take insulin or oral hypoglycemic agents, watch for signs and symptoms of hypoglycemia. They include:  Dizziness.  Shaking.  Sweating.  Chills.  Confusion.  Drink plenty of water while you exercise to prevent dehydration or heat stroke. Body water is lost during exercise and must be replaced.  Talk to your health care provider before starting an exercise program to make sure it is safe for you. Remember, almost any type of activity is better than none.   This information is not intended to replace advice given to you by your health care provider. Make sure you discuss any questions you have with your health care provider.   Document Released: 08/14/2003 Document Revised: 10/08/2014 Document Reviewed: 10/31/2012 Elsevier Interactive Patient Education 2016 Else[employer] Inc.   

## 2016-01-21 NOTE — Progress Notes (Signed)
Chad Wolfe, is a 50 y.o. male  ZOX:[id number]  [id number]  DOB - 12/20/1965  Chief Complaint  Patient presents with  . Follow-up        Subjective:   Chad Wolfe is a 50 y.o. male here today for a follow up visit, last seen in clinic 5/17, w/ significant hx of dm, chronic hep c,  htn, etoh abuse 1 year ago ( no etoh since stopping cold [place] 1 year ago).  He states his abdominal pain Improved greatly. He does get occasional acid reflux, which Zantac has helped considerably.  Off Carafate at this time. Denies any chest pains. He notes he's been exercising more and eating a little bit better although he does admit to eating some canned soups and the rare fried chicken (which induces heartburn).  He stopped taking the metformin recently because it was causing more stomach upset, but he's been watching his diet a little bit better and exercising more to smoking 2 cigarettes every week or so  Pt states he has appt w/ ID Sept 16 or so for hep c.  Patient has No headache, No chest pain, No abdominal pain - No Nausea, No new weakness tingling or numbness, No Cough - SOB.  Problem  Diabetes Mellitus Type 2 in Nonobese (Hcc)    ALLERGIES: Allergies  Allergen Reactions  . Ibuprofen Other (See Comments)    Stomach upset  . Aspirin Other (See Comments)  . Other Other (See Comments)    [facility]  . Tylenol [Acetaminophen] Other (See Comments)    HEP C    PAST MEDICAL HISTORY: Past Medical History:  Diagnosis Date  . Diabetes mellitus type 2 in nonobese (HCC) 01/21/2016  . Gastric ulcer   . Hepatitis C   . Hyperlipidemia   . Hypertension   . Substance abuse     MEDICATIONS AT HOME: Prior to Admission medications   Medication Sig Start Date End Date Taking? Authorizing Provider  lisinopril (PRINIVIL,ZESTRIL) 5 MG tablet Take 1 tablet (5 mg total) by mouth daily. No more refills - must have OV 10/22/15  Yes Pete Glatterawn T Sheli Dorin, MD    ranitidine (ZANTAC) 150 MG tablet Take 1 tablet (150 mg total) by mouth 2 (two) times daily. 10/22/15  Yes Pete Glatterawn T Larcenia Holaday, MD  simvastatin (ZOCOR) 20 MG tablet Take 1 tablet (20 mg total) by mouth at bedtime. 11/06/15  Yes Pete Glatterawn T Yaileen Hofferber, MD  ondansetron (ZOFRAN ODT) 4 MG disintegrating tablet Take 1 tablet (4 mg total) by mouth every 8 (eight) hours as needed for nausea or vomiting. Patient not taking: Reported on 10/22/2015 05/07/15   Barrett HenleNicole Elizabeth Nadeau, PA-C     Objective:   Vitals:   01/21/16 1215  BP: 118/81  Pulse: 71  Resp: 16  Temp: 98.2 F (36.8 C)  TempSrc: Oral  SpO2: 96%  Weight: 172 lb 3.2 oz (78.1 kg)    Exam General appearance : Awake, alert, not in any distress. Speech Clear. Not toxic looking, looks great, good spirits HEENT: Atraumatic and Normocephalic, pupils equally reactive to light. Neck: supple, no JVD.  Chest:Good air entry bilaterally, no added sounds. CVS: S1 S2 regular, no murmurs/gallups or rubs. Abdomen: Bowel sounds active, Non tender and not distended with no gaurding, rigidity or rebound. Extremities: B/L Lower Ext shows no edema, both legs are warm to touch Neurology: Awake alert, and oriented X 3, CN II-XII grossly intact, Non focal Skin:No Rash  Data  Review Lab Results  Component Value Date   HGBA1C 6.8 (H) 10/22/2015   HGBA1C 6.1 (H) 09/02/2014   HGBA1C 6.0 (H) 03/16/2010    Depression screen [facility] 2/9 01/21/2016 10/22/2015 05/05/2015  Decreased Interest 0 0 0  Down, Depressed, Hopeless 0 3 3  PHQ - 2 Score 0 3 3  Altered sleeping - 3 3  Tired, decreased energy - 0 3  Change in appetite - 0 2  Feeling bad or failure about yourself  - 3 3  Trouble concentrating - 0 0  Moving slowly or fidgety/restless - 0 3  Suicidal thoughts - 0 1  PHQ-9 Score - 9 18  Difficult doing work/chores - Not difficult at all -      Assessment & Plan   1. Essential hypertension well controlled, continue lisinopril 5 qd  2. Diabetes mellitus  type 2 in nonobese (HCC) Could not tolerate metformin due to gi s/es, continue aggressive diet/lifestyle modifications. - consider victoza next time. - POCT glycosylated hemoglobin (Hb A1C) 6.8 (unchanged)  3. Chronic hepatitis C without hepatic coma (HCC) Per pt, has appt w/  ID this Sept ( I did not see it on the appt schedule though)  4. hld - on simvastatin  5. Tob, 2 cigs occassaionally Complete cessation recd.   Patient have been counseled extensively about nutrition and exercise  Return in about 3 months (around 04/22/2016), or if symptoms worsen or fail to improve.  The patient was given clear instructions to go to ER or return to medical center if symptoms don't improve, worsen or new problems develop. The patient verbalized understanding. The patient was told to call to get lab results if they haven't heard anything in the next week.   This note has been created with Education officer, environmentalDragon speech recognition software and smart phrase technology. Any transcriptional errors are unintentional.   Pete Glatterawn T Geanine Vandekamp, MD, MBA/MHA [facility] [place], [place] [phone]   01/21/2016, 1:19 PM

## 2016-02-17 MED FILL — ?LISINOPRIL 5 MG TABLET: 5 | 30 days supply | Qty: 30 | Fill #3

## 2016-03-17 MED FILL — ?LISINOPRIL 5 MG TABLET: 5 | 30 days supply | Qty: 30 | Fill #4

## 2016-03-24 IMAGING — CT CT ABD-PELV W/ CM
2 of 4 series · 15 of 46 positions shown, 17 images · IV contrast (Omni 300)
Comparison: None.

CLINICAL DATA: Epigastric pain, abdominal pain, vomiting for 4
days.

EXAM:
CT ABDOMEN AND PELVIS WITH CONTRAST
TECHNIQUE: Multidetector CT imaging of the abdomen and pelvis was performed
using the standard protocol following bolus administration of
intravenous contrast.
CONTRAST:  100mL OMNIPAQUE IOHEXOL 300 MG/ML  SOLN

[Series 2: a/p w/ 5mm · axial · 0.63mm/px · z∈[+925,+1375]mm · 12 of 100 slices shown, 14 images]
[im 5/100  soft-tissue]
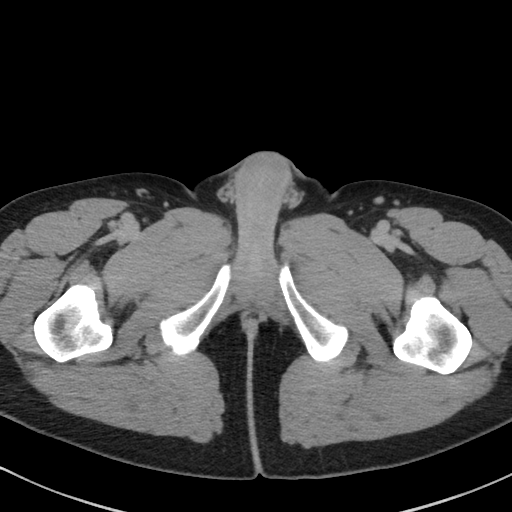
[im 5/100  bone]
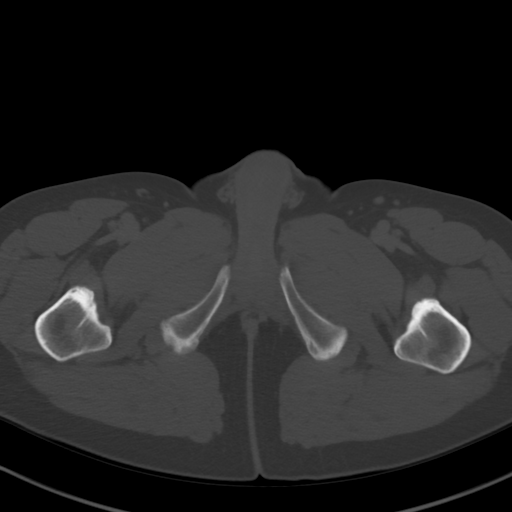
[im 13/100  soft-tissue]
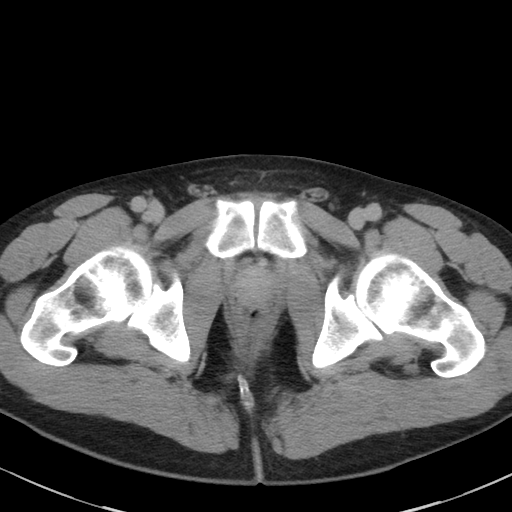
[im 22/100  soft-tissue]
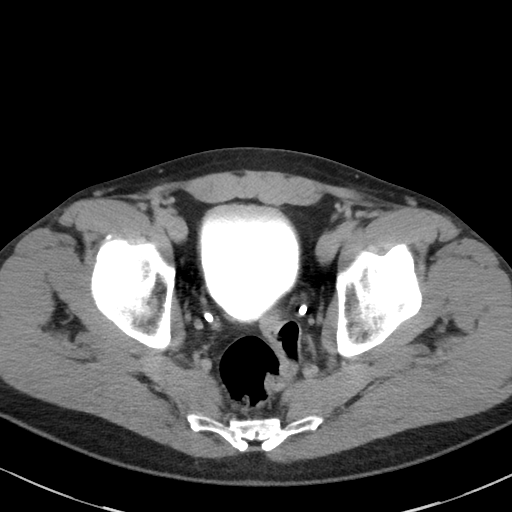
[im 31/100  soft-tissue]
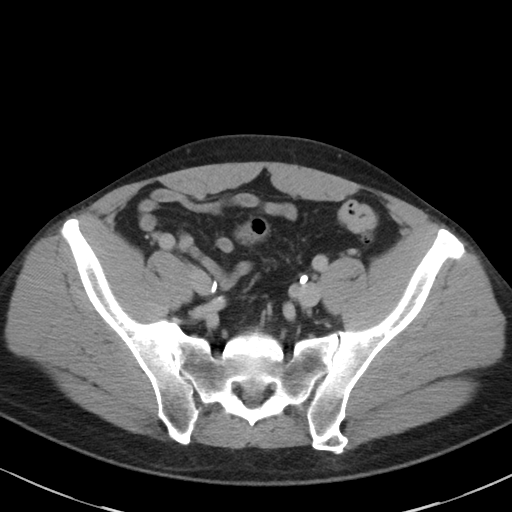
[im 39/100  soft-tissue]
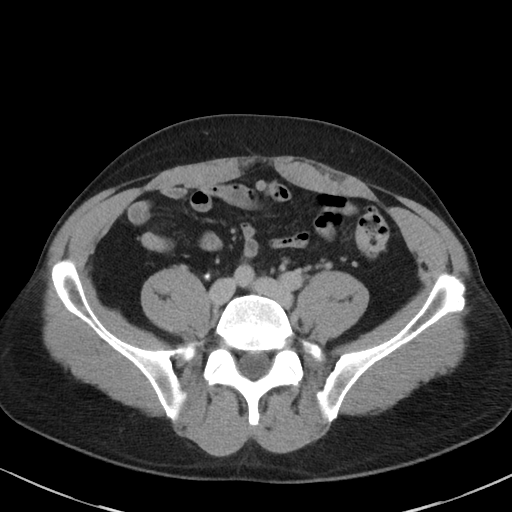
[im 48/100  soft-tissue]
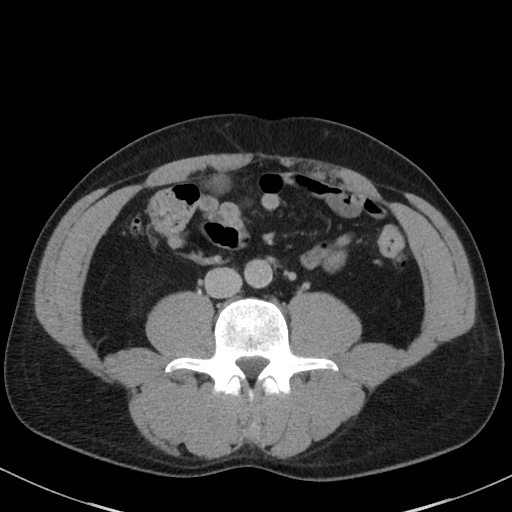
[im 52/100  soft-tissue]
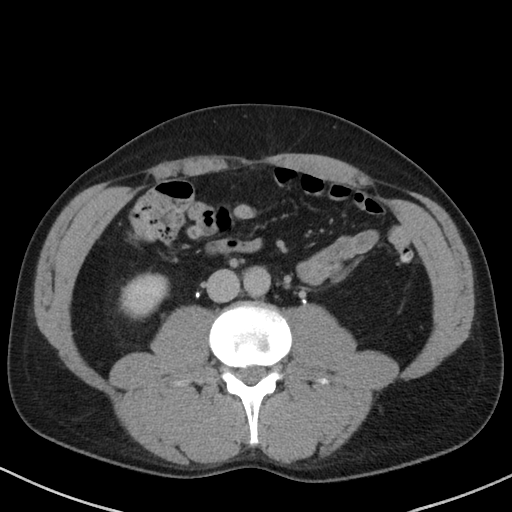
[im 61/100  soft-tissue]
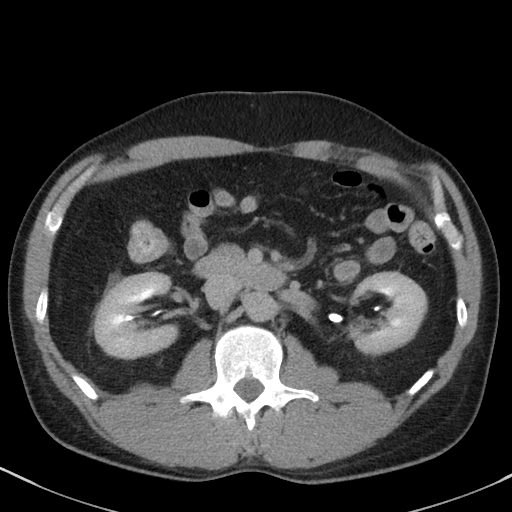
[im 69/100  soft-tissue]
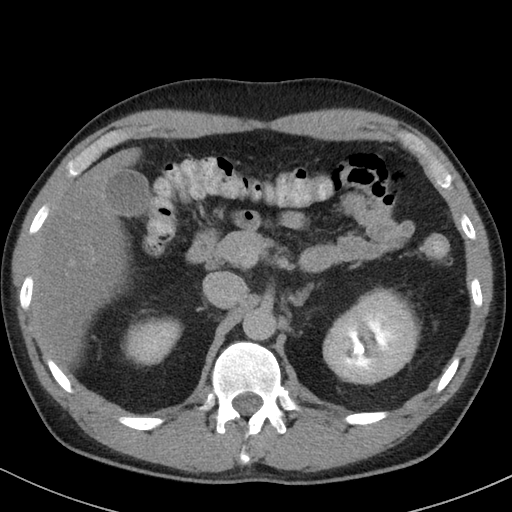
[im 69/100  bone]
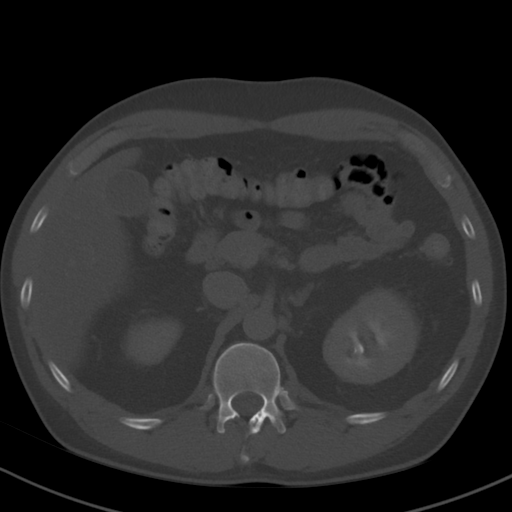
[im 78/100  soft-tissue]
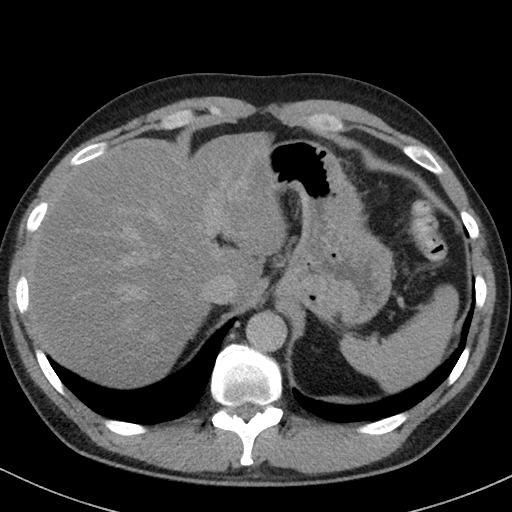
[im 87/100  soft-tissue]
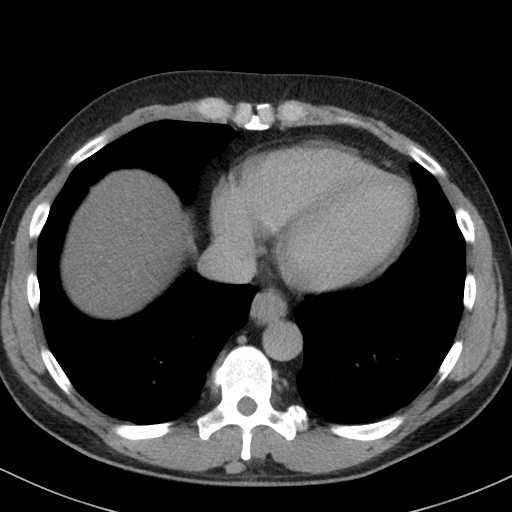
[im 95/100  soft-tissue]
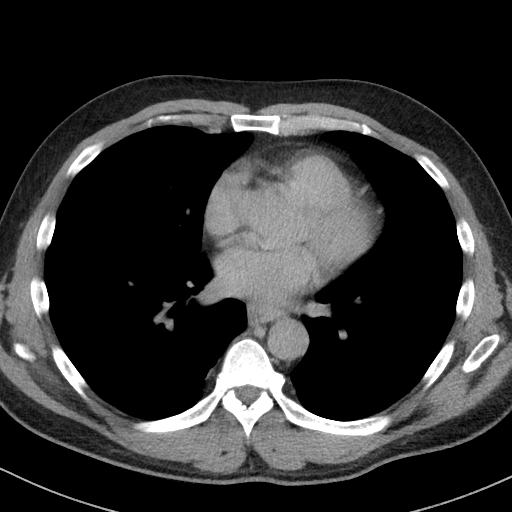

[Series 5: a/p w/ cor · coronal · 0.64mm/px · 3 of 111 slices shown]
[im 37/111  soft-tissue]
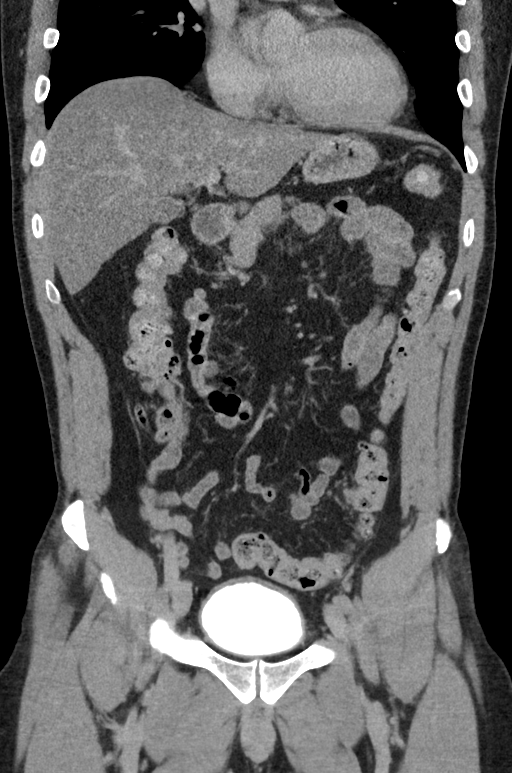
[im 49/111  soft-tissue]
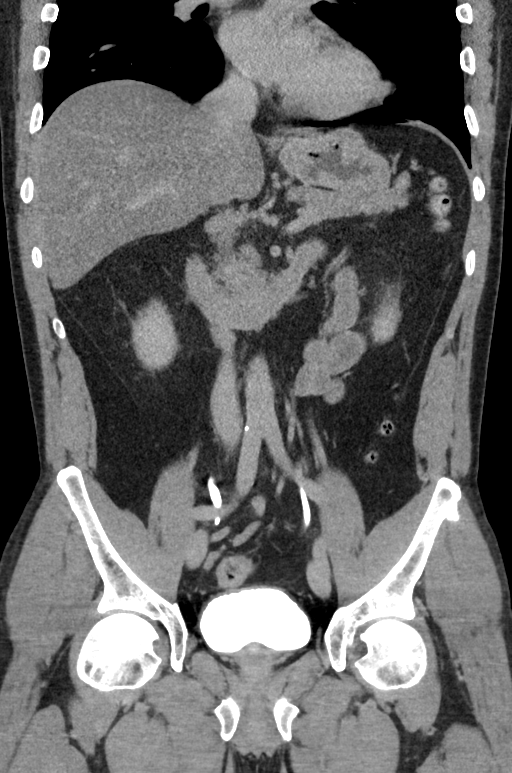
[im 62/111  soft-tissue]
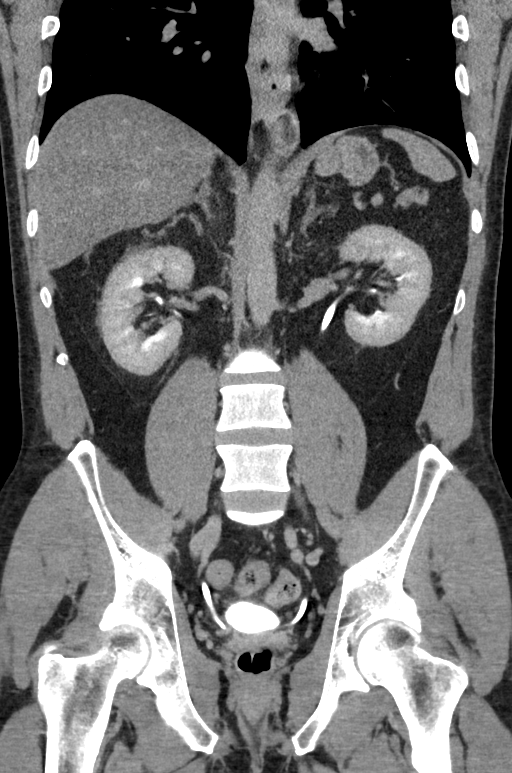

[15 of 46 positions shown; findings below may reference images not displayed]

FINDINGS: Study limited by the delayed phase of contrast on imaging, patient
had projectile vomiting during initial injection filling acquisition
of images.

Liver is low in density suggesting fatty infiltrate. No focal mass
or lesion seen within the liver. Gallbladder appears normal. No bile
duct dilatation.

Pancreas appears normal. No peripancreatic fluid or inflammation.
Spleen appears normal. Adrenal glands appear normal. Kidneys are
unremarkable without stone or hydronephrosis. No obstructing
ureteral stones seen. Bladder is unremarkable.

Bowel is normal in caliber. Scattered diverticulosis noted within
the descending and sigmoid colon without evidence of acute
diverticulitis. Appendix is normal. Stomach is unremarkable. There
is questionable thickening of the walls of the distal esophagus.

No free fluid or abscess collection. No free intraperitoneal air.
Scattered mild atherosclerotic changes noted along the walls of the
normal-caliber abdominal aorta. Lung bases are clear. Mild
degenerative change noted within the lumbar spine but no acute
osseous abnormality. Superficial soft tissues are unremarkable.
IMPRESSION: 1. Fatty infiltration of the liver. No focal mass or lesion within
the liver. Gallbladder appears normal. No bile duct dilatation.
2. Colonic diverticulosis without evidence of acute diverticulitis.
3. Questionable thickening of the walls of the distal esophagus.
Esophagitis? Sequela of chronic reflux?
4. Remainder of the abdomen and pelvis CT examination is
unremarkable. No free fluid. No bowel obstruction or evidence of
bowel wall inflammation. No evidence of acute solid organ
abnormality.
5. Study limitations detailed above.

## 2016-04-19 MED FILL — ?LISINOPRIL 5 MG TABLET: 5 | 30 days supply | Qty: 30 | Fill #5

## 2016-04-30 IMAGING — CR DG ABDOMEN ACUTE W/ 1V CHEST
3 series · 3 of 3 positions shown · non-contrast
Comparison: CT abdomen 05/07/2015

CLINICAL DATA: Epigastric pain

EXAM:
DG ABDOMEN ACUTE W/ 1V CHEST

[chest pa]
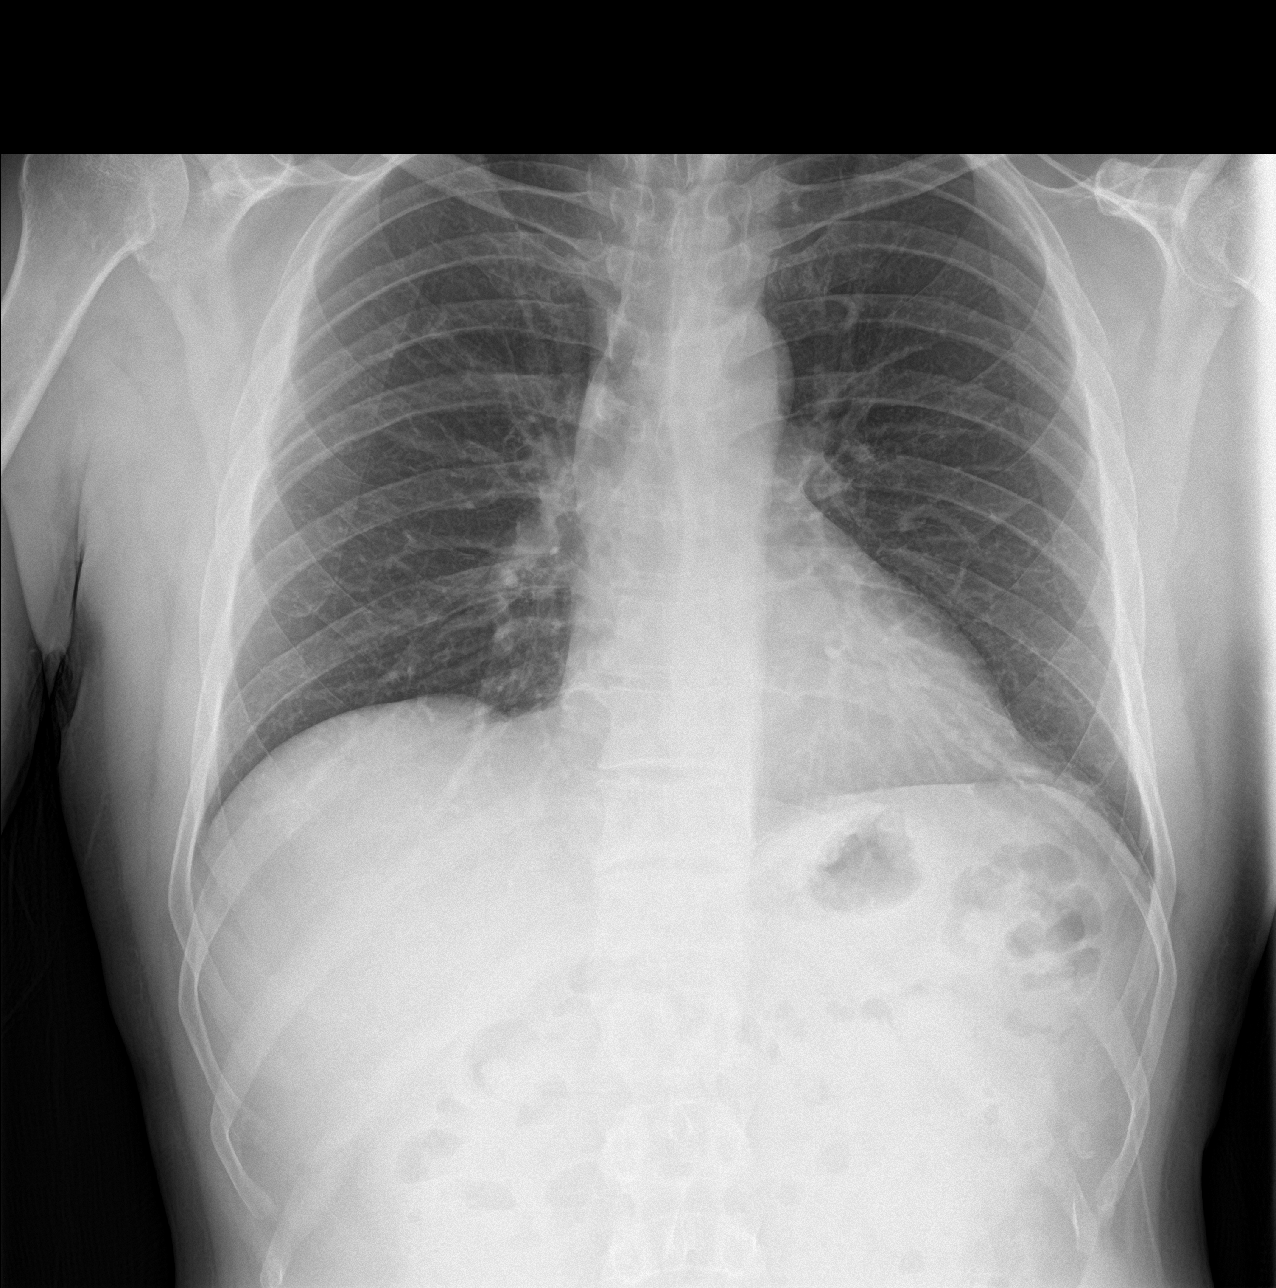

[abdomen erect]
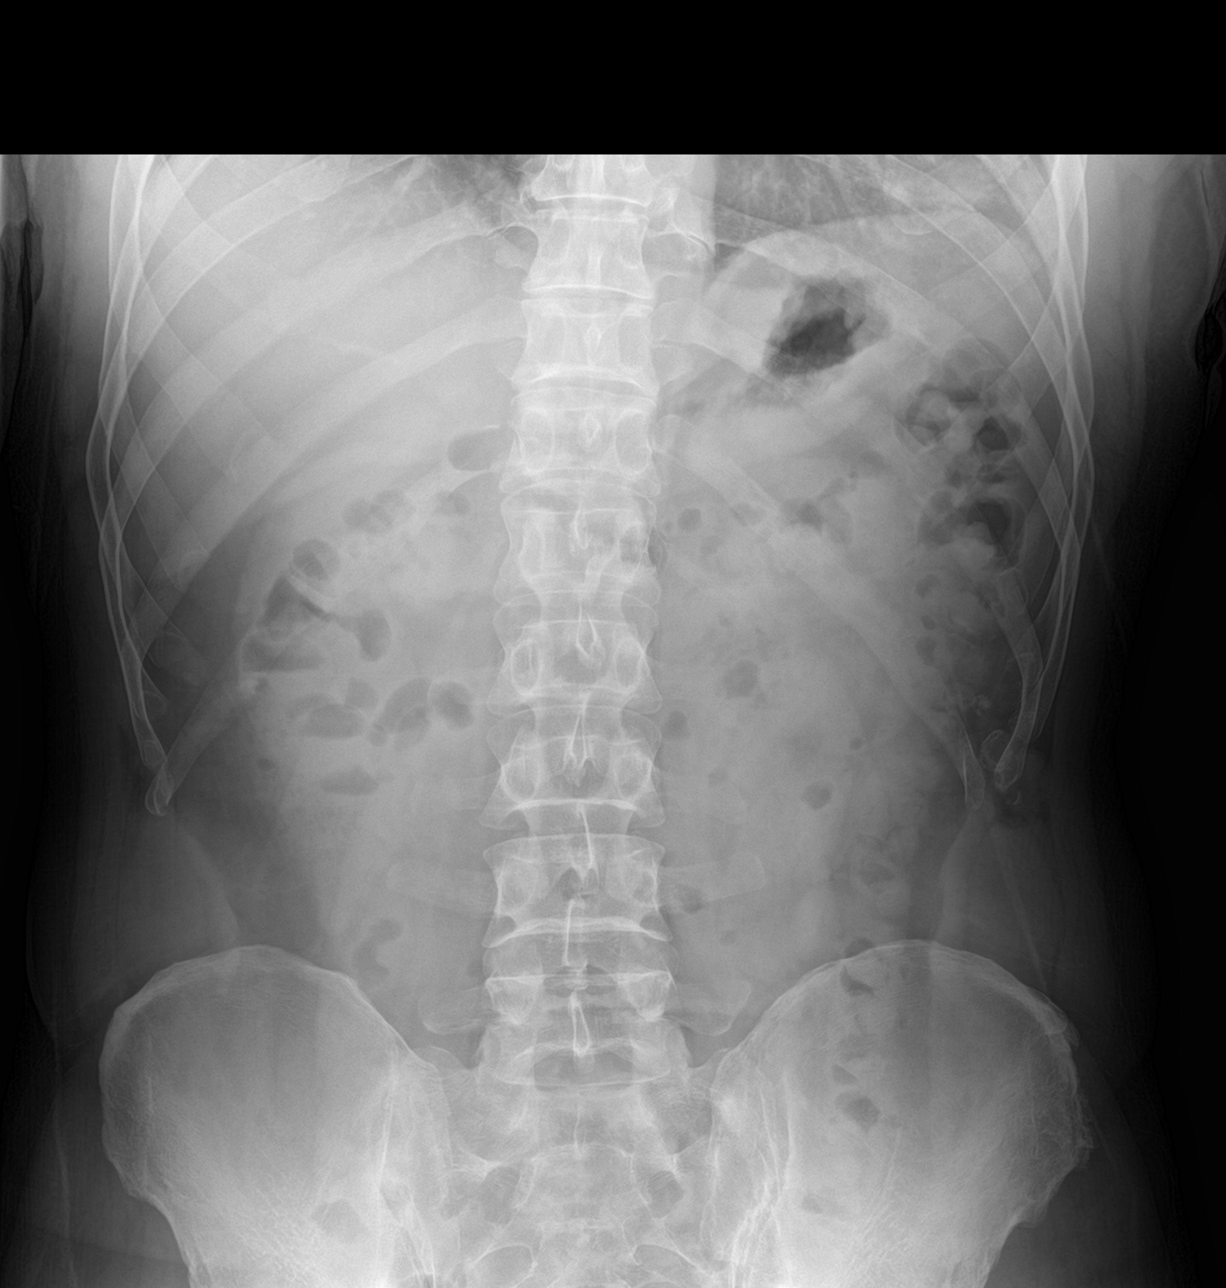

[abdomen supine]
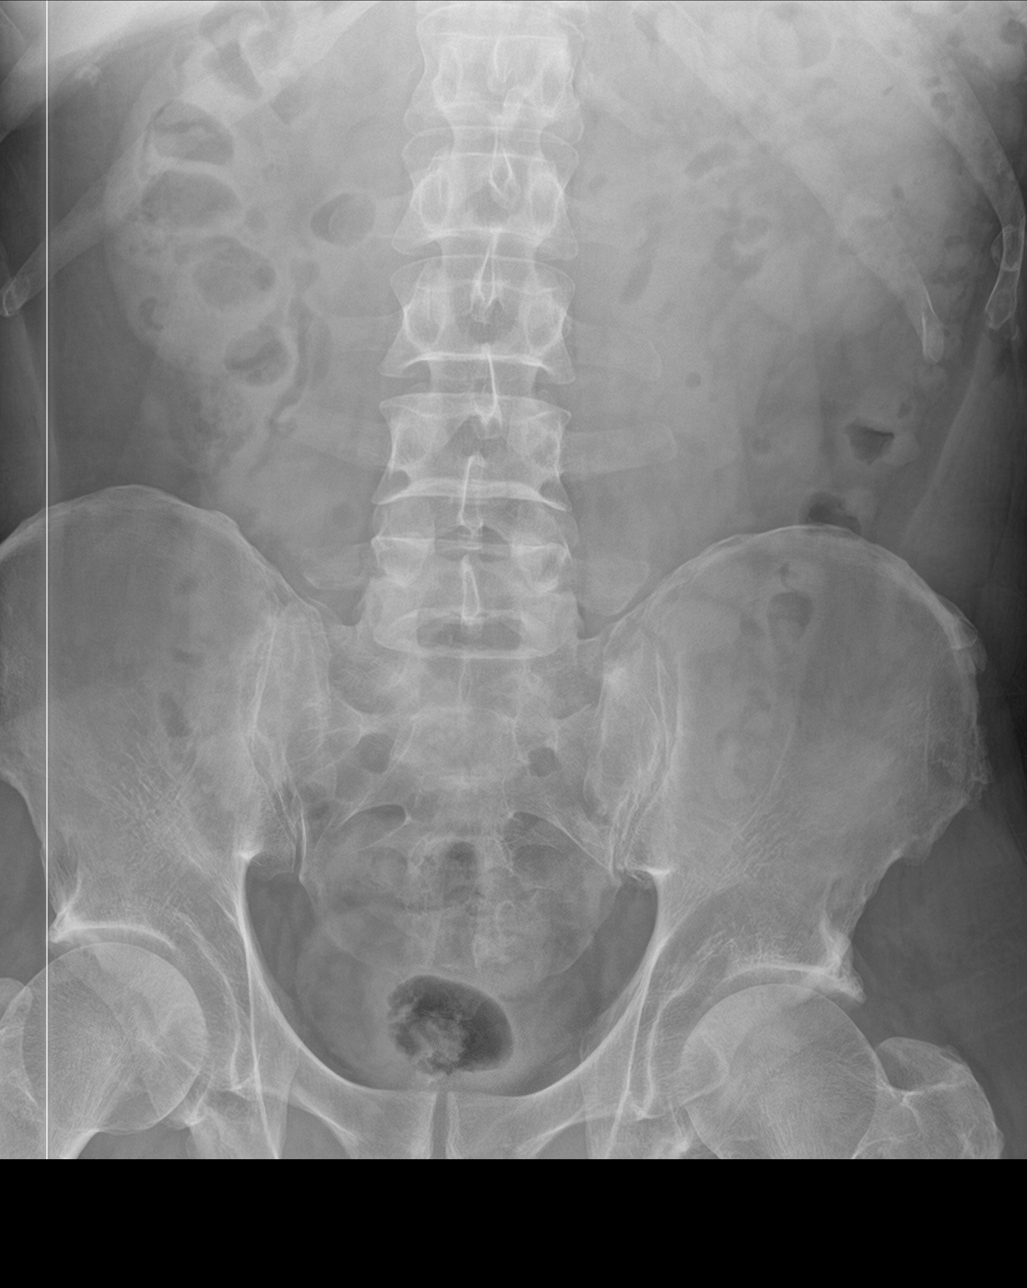

[3 of 3 positions shown; findings below may reference images not displayed]

FINDINGS: There is no evidence of dilated bowel loops or free intraperitoneal
air. No radiopaque calculi or other significant radiographic
abnormality is seen. Heart size and mediastinal contours are within
normal limits. Both lungs are clear.
IMPRESSION: Negative abdominal radiographs.  No acute cardiopulmonary disease.

## 2016-05-18 MED FILL — LISINOPRIL 5 MG TABLET: 5 | 30 days supply | Qty: 30 | Fill #6

## 2016-06-16 MED FILL — LISINOPRIL 5 MG TABLET: 5 | 30 days supply | Qty: 30 | Fill #7

## 2016-07-02 ENCOUNTER — Encounter (HOSPITAL_COMMUNITY): Payer: Self-pay | Admitting: Emergency Medicine

## 2016-07-02 ENCOUNTER — Emergency Department (HOSPITAL_COMMUNITY)
Admission: EM | Admit: 2016-07-02 | Discharge: 2016-07-02 | Disposition: A | Discharge status: Home / Self Care | Payer: Self-pay | Attending: Emergency Medicine | Admitting: Emergency Medicine

## 2016-07-02 ENCOUNTER — Emergency Department (HOSPITAL_COMMUNITY): Payer: Self-pay

## 2016-07-02 DIAGNOSIS — S61202A Unspecified open wound of right middle finger without damage to nail, initial encounter: Secondary | ICD-10-CM | POA: Insufficient documentation

## 2016-07-02 DIAGNOSIS — Z23 Encounter for immunization: Secondary | ICD-10-CM | POA: Insufficient documentation

## 2016-07-02 DIAGNOSIS — S61401A Unspecified open wound of right hand, initial encounter: Secondary | ICD-10-CM

## 2016-07-02 DIAGNOSIS — S0990XA Unspecified injury of head, initial encounter: Secondary | ICD-10-CM | POA: Insufficient documentation

## 2016-07-02 DIAGNOSIS — Y999 Unspecified external cause status: Secondary | ICD-10-CM | POA: Insufficient documentation

## 2016-07-02 DIAGNOSIS — Y9301 Activity, walking, marching and hiking: Secondary | ICD-10-CM | POA: Insufficient documentation

## 2016-07-02 DIAGNOSIS — S0181XA Laceration without foreign body of other part of head, initial encounter: Secondary | ICD-10-CM | POA: Insufficient documentation

## 2016-07-02 DIAGNOSIS — I10 Essential (primary) hypertension: Secondary | ICD-10-CM | POA: Insufficient documentation

## 2016-07-02 DIAGNOSIS — S61204A Unspecified open wound of right ring finger without damage to nail, initial encounter: Secondary | ICD-10-CM | POA: Insufficient documentation

## 2016-07-02 DIAGNOSIS — F17200 Nicotine dependence, unspecified, uncomplicated: Secondary | ICD-10-CM | POA: Insufficient documentation

## 2016-07-02 DIAGNOSIS — E119 Type 2 diabetes mellitus without complications: Secondary | ICD-10-CM | POA: Insufficient documentation

## 2016-07-02 DIAGNOSIS — W228XXA Striking against or struck by other objects, initial encounter: Secondary | ICD-10-CM | POA: Insufficient documentation

## 2016-07-02 DIAGNOSIS — Z79899 Other long term (current) drug therapy: Secondary | ICD-10-CM | POA: Insufficient documentation

## 2016-07-02 DIAGNOSIS — Y929 Unspecified place or not applicable: Secondary | ICD-10-CM | POA: Insufficient documentation

## 2016-07-02 DIAGNOSIS — S61200A Unspecified open wound of right index finger without damage to nail, initial encounter: Secondary | ICD-10-CM | POA: Insufficient documentation

## 2016-07-02 MED ORDER — LIDOCAINE-EPINEPHRINE (PF) 2 %-1:200000 IJ SOLN
10.0000 mL | Freq: Once | INTRAMUSCULAR | Status: AC
  Administered 2016-07-02: 10 mL via INTRADERMAL
  Filled 2016-07-02: qty 20

## 2016-07-02 MED ORDER — TETANUS-DIPHTH-ACELL PERTUSSIS 5-2.5-18.5 LF-MCG/0.5 IM SUSP
0.5000 mL | Freq: Once | INTRAMUSCULAR | Status: AC
  Administered 2016-07-02: 0.5 mL via INTRAMUSCULAR
  Filled 2016-07-02: qty 0.5

## 2016-07-02 NOTE — ED Notes (Signed)
Patient transported to CT 

## 2016-07-02 NOTE — ED Triage Notes (Signed)
Patient back into triage for re-assessment. No feeling dizzy and with increased pain. Prior assessment, patient had minimal pain. Now complaining of eye pain. GCS 15. PERRL. VS at baseline.

## 2016-07-02 NOTE — ED Notes (Signed)
Patient appears to exhibit difficulty staying awake and acts as though his eyes are rolling back in his head.
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
## 2016-07-07 ENCOUNTER — Ambulatory Visit (HOSPITAL_COMMUNITY)
Admission: EM | Admit: 2016-07-07 | Discharge: 2016-07-07 | Disposition: A | Discharge status: Home / Self Care | Payer: No Typology Code available for payment source | Attending: Family Medicine | Admitting: Family Medicine

## 2016-07-07 ENCOUNTER — Encounter (HOSPITAL_COMMUNITY): Payer: Self-pay | Admitting: Emergency Medicine

## 2016-07-07 DIAGNOSIS — Z4802 Encounter for removal of sutures: Secondary | ICD-10-CM

## 2016-07-07 NOTE — ED Triage Notes (Signed)
[facility] a complaint of needing sutures removed from his forehead that were placed on 07/03/2015.

## 2016-07-07 NOTE — Discharge Instructions (Signed)
Your laceration appears to be healing well without signs of infection. 5 sutures have been removed and bandaid has been applied. Keep your wound clean, should it become infected follow up with your primary care provider or return to clinic as needed.

## 2016-07-07 NOTE — ED Provider Notes (Signed)
CSN: [id number]     Arrival date & time 07/07/16  1000 History   First MD Initiated Contact with Patient 07/07/16 1009     Chief Complaint  Patient presents with  . Suture / Staple Removal   (Consider location/radiation/quality/duration/timing/severity/associated sxs/prior Treatment) 51 year old male presents for suture removal. [facility], 5 sutures on his forehead. He has had no complaints and no signs of infection.   The history is provided by the patient.  Suture / Staple Removal     Past Medical History:  Diagnosis Date  . Diabetes mellitus type 2 in nonobese (HCC) 01/21/2016  . Gastric ulcer   . Hepatitis C   . Hyperlipidemia   . Hypertension   . Substance abuse    Past Surgical History:  Procedure Laterality Date  . STOMACH SURGERY     Family History  Problem Relation Age of Onset  . Diabetes Father   . Hypertension Father   . Diabetes Sister   . Hypertension Sister   . Diabetes Brother   . Hypertension Brother    Social History  Substance Use Topics  . Smoking status: Current Some Day Smoker  . Smokeless tobacco: Not on file  . Alcohol use 0.0 oz/week     Comment: drank a quart of beer this morning PT DENIES 09/26/15    Review of Systems  Reason unable to perform ROS: as covered in HPI.  All other systems reviewed and are negative.   Allergies  Ibuprofen; Aspirin; Other; and Tylenol [acetaminophen]  Home Medications   Prior to Admission medications   Medication Sig Start Date End Date Taking? Authorizing Provider  lisinopril (PRINIVIL,ZESTRIL) 5 MG tablet Take 1 tablet (5 mg total) by mouth daily. No more refills - must have OV 10/22/15  Yes Pete Glatterawn T Langeland, MD   Meds Ordered and Administered this Visit  Medications - No data to display  BP 134/91 (BP Location: Right Arm)   Pulse 64   Temp 97.6 F (36.4 C) (Oral)   Resp 16   SpO2 100%  No data found.   Physical Exam  Constitutional: He is oriented to person, place,  and time. He appears well-developed and well-nourished. No distress.  HENT:  Head: Normocephalic.  Right Ear: External ear normal.  Left Ear: External ear normal.  Mouth/Throat: Oropharynx is clear and moist.  Neurological: He is alert and oriented to person, place, and time.  Skin: Skin is warm and dry. Capillary refill takes less than 2 seconds. He is not diaphoretic.     Nursing note and vitals reviewed.   Urgent Care Course     Procedures (including critical care time)  Labs Review Labs Reviewed - No data to display  Imaging Review No results found.   Visual Acuity Review  Right Eye Distance:   Left Eye Distance:   Bilateral Distance:    Right Eye Near:   Left Eye Near:    Bilateral Near:         MDM   1. Visit for suture removal   Your laceration appears to be healing well without signs of infection. 5 sutures have been removed and bandaid has been applied. Keep your wound clean, should it become infected follow up with your primary care provider or return to clinic as needed.      Dorena BodoLawrence Aleana Fifita, NP 07/07/16 1022

## 2016-07-19 MED FILL — LISINOPRIL 5 MG TABLET: 5 | 30 days supply | Qty: 30 | Fill #8

## 2016-07-21 ENCOUNTER — Emergency Department (HOSPITAL_COMMUNITY)
Admission: EM | Admit: 2016-07-21 | Discharge: 2016-07-21 | Disposition: A | Discharge status: Home / Self Care | Payer: No Typology Code available for payment source | Attending: Emergency Medicine | Admitting: Emergency Medicine

## 2016-07-21 ENCOUNTER — Encounter (HOSPITAL_COMMUNITY): Payer: Self-pay

## 2016-07-21 DIAGNOSIS — S91331A Puncture wound without foreign body, right foot, initial encounter: Secondary | ICD-10-CM

## 2016-07-21 DIAGNOSIS — Y9389 Activity, other specified: Secondary | ICD-10-CM | POA: Insufficient documentation

## 2016-07-21 DIAGNOSIS — W450XXA Nail entering through skin, initial encounter: Secondary | ICD-10-CM | POA: Insufficient documentation

## 2016-07-21 DIAGNOSIS — F17200 Nicotine dependence, unspecified, uncomplicated: Secondary | ICD-10-CM | POA: Insufficient documentation

## 2016-07-21 DIAGNOSIS — Y9289 Other specified places as the place of occurrence of the external cause: Secondary | ICD-10-CM | POA: Insufficient documentation

## 2016-07-21 DIAGNOSIS — Y999 Unspecified external cause status: Secondary | ICD-10-CM | POA: Insufficient documentation

## 2016-07-21 DIAGNOSIS — I10 Essential (primary) hypertension: Secondary | ICD-10-CM | POA: Insufficient documentation

## 2016-07-21 DIAGNOSIS — E119 Type 2 diabetes mellitus without complications: Secondary | ICD-10-CM | POA: Insufficient documentation

## 2016-07-21 MED ORDER — CIPROFLOXACIN HCL 500 MG PO TABS: 500.0000 mg | ORAL_TABLET | Freq: Two times a day (BID) | ORAL | 0 refills | Status: DC

## 2016-07-21 MED ORDER — CHLORHEXIDINE GLUCONATE 4 % EX LIQD: Freq: Every day | CUTANEOUS | 0 refills | Status: DC | PRN

## 2016-07-21 NOTE — ED Triage Notes (Signed)
Pt states he stepped on a nail yesterday at work. Pt has no complaints but is here per job policy. Last teatnus 3 weeks ago.

## 2016-07-21 NOTE — ED Provider Notes (Signed)
[facility]-EMERGENCY DEPT Provider Note   CSN: [id number] Arrival date & time: 07/21/16  1117  By signing my name below, I, Teofilo PodMatthew P. Jamison, attest that this documentation has been prepared under the direction and in the presence of Roxy Horsemanobert Bobi Daudelin, PA-C. Electronically Signed: Teofilo PodMatthew P. Jamison, ED Scribe. 07/21/2016. 11:56 AM.   History   Chief Complaint Chief Complaint  Patient presents with  . Puncture Wound    The history is provided by the patient. No language interpreter was used.   HPI Comments:  Chad Wolfe is a 51 y.o. male who presents to the Emergency Department, here due to a puncture wound on his right foot that he sustained last night. Pt reports that he stepped on a nail at work last night that went through his work boots and socks. Pt states that the area is painful. Last tetanus was 3 weeks ago.   Past Medical History:  Diagnosis Date  . Diabetes mellitus type 2 in nonobese (HCC) 01/21/2016  . Gastric ulcer   . Hepatitis C   . Hyperlipidemia   . Hypertension   . Substance abuse     Patient Active Problem List   Diagnosis Date Noted  . Diabetes mellitus type 2 in nonobese (HCC) 01/21/2016  . Fall from chair 05/15/2015  . Alcohol abuse 05/05/2015  . Pain, dental 03/27/2013  . HTN (hypertension) 04/25/2012  . Chronic hepatitis C virus infection (HCC) 11/15/2007  . ABDOMINAL PAIN-EPIGASTRIC 11/15/2007    Past Surgical History:  Procedure Laterality Date  . STOMACH SURGERY         Home Medications    Prior to Admission medications   Medication Sig Start Date End Date Taking? Authorizing Provider  lisinopril (PRINIVIL,ZESTRIL) 5 MG tablet Take 1 tablet (5 mg total) by mouth daily. No more refills - must have OV 10/22/15   Pete Glatterawn T Langeland, MD    Family History Family History  Problem Relation Age of Onset  . Diabetes Father   . Hypertension Father   . Diabetes Sister   . Hypertension Sister   . Diabetes Brother   . Hypertension Brother       Social History Social History  Substance Use Topics  . Smoking status: Current Some Day Smoker  . Smokeless tobacco: Not on file  . Alcohol use 0.0 oz/week     Comment: drank a quart of beer this morning PT DENIES 09/26/15     Allergies   Ibuprofen; Aspirin; Other; and Tylenol [acetaminophen]   Review of Systems Review of Systems  Constitutional: Negative for fever.  Skin: Positive for wound.     Physical Exam Updated Vital Signs BP 106/73 (BP Location: Left Arm)   Pulse 69   Temp 98.6 F (37 C)   Resp 16   SpO2 97%   Physical Exam  Constitutional: He appears well-developed and well-nourished. No distress.  HENT:  Head: Normocephalic and atraumatic.  Eyes: Conjunctivae are normal.  Cardiovascular: Normal rate.   Pulmonary/Chest: Effort normal.  Abdominal: He exhibits no distension.  Neurological: He is alert.  Skin: Skin is warm and dry.  Small puncture wound to sole of right foot, no discharge, no bleeding, no erythema, no evidence of retained FB or abscess  Psychiatric: He has a normal mood and affect.  Nursing note and vitals reviewed.    ED Treatments / Results  DIAGNOSTIC STUDIES:  Oxygen Saturation is 97% on RA, normal by my interpretation.    COORDINATION OF CARE:  11:56 AM Will prescribe antibiotics.  Discussed treatment plan with pt at bedside and pt agreed to plan.   Labs (all labs ordered are listed, but only abnormal results are displayed) Labs Reviewed - No data to display  EKG  EKG Interpretation None       Radiology No results found.  Procedures Procedures (including critical care time)  Medications Ordered in ED Medications - No data to display   Initial Impression / Assessment and Plan / ED Course  I have reviewed the triage vital signs and the nursing notes.  Pertinent labs & imaging results that were available during my care of the patient were reviewed by me and considered in my medical decision making (see chart  for details).    Patient stepped on a nail yesterday.  Went through sole of shoe.  Will treat with cipro.  No evidence of FB or infection. Tdap is current.  Final Clinical Impressions(s) / ED Diagnoses   Final diagnoses:  Puncture wound of right foot, initial encounter    New Prescriptions New Prescriptions   CHLORHEXIDINE (HIBICLENS) 4 % EXTERNAL LIQUID    Apply topically daily as needed.   CIPROFLOXACIN (CIPRO) 500 MG TABLET    Take 1 tablet (500 mg total) by mouth 2 (two) times daily.   I personally performed the services described in this documentation, which was scribed in my presence. The recorded information has been reviewed and is accurate.       Roxy Horseman, PA-C 07/21/16 1227    Jacalyn Lefevre, MD 07/21/16 [phone]

## 2016-07-22 MED FILL — ?CIPROFLOXACIN HCL 500MG TA: 500 | 7 days supply | Qty: 14 | Fill #0

## 2016-08-16 MED FILL — ?LISINOPRIL 5 MG TABLET: 5 | 30 days supply | Qty: 30 | Fill #9

## 2016-09-08 ENCOUNTER — Telehealth: Payer: Self-pay

## 2016-09-09 NOTE — Telephone Encounter (Signed)
called pt to find out if colonoscopy can be scheduled 

## 2016-09-21 MED FILL — ?LISINOPRIL 5 MG TABLET: 5 | 30 days supply | Qty: 30 | Fill #10

## 2016-10-02 ENCOUNTER — Encounter (HOSPITAL_COMMUNITY): Payer: Self-pay

## 2016-10-02 ENCOUNTER — Emergency Department (HOSPITAL_COMMUNITY)
Admission: EM | Admit: 2016-10-02 | Discharge: 2016-10-03 | Disposition: A | Discharge status: Home / Self Care | Payer: No Typology Code available for payment source | Attending: Emergency Medicine | Admitting: Emergency Medicine

## 2016-10-02 DIAGNOSIS — F17200 Nicotine dependence, unspecified, uncomplicated: Secondary | ICD-10-CM | POA: Insufficient documentation

## 2016-10-02 DIAGNOSIS — S60811A Abrasion of right wrist, initial encounter: Secondary | ICD-10-CM | POA: Insufficient documentation

## 2016-10-02 DIAGNOSIS — I10 Essential (primary) hypertension: Secondary | ICD-10-CM | POA: Insufficient documentation

## 2016-10-02 DIAGNOSIS — S6391XA Sprain of unspecified part of right wrist and hand, initial encounter: Secondary | ICD-10-CM | POA: Insufficient documentation

## 2016-10-02 DIAGNOSIS — S161XXA Strain of muscle, fascia and tendon at neck level, initial encounter: Secondary | ICD-10-CM

## 2016-10-02 DIAGNOSIS — Y92410 Unspecified street and highway as the place of occurrence of the external cause: Secondary | ICD-10-CM | POA: Insufficient documentation

## 2016-10-02 DIAGNOSIS — S63501A Unspecified sprain of right wrist, initial encounter: Secondary | ICD-10-CM

## 2016-10-02 DIAGNOSIS — S0090XA Unspecified superficial injury of unspecified part of head, initial encounter: Secondary | ICD-10-CM

## 2016-10-02 DIAGNOSIS — Y999 Unspecified external cause status: Secondary | ICD-10-CM | POA: Insufficient documentation

## 2016-10-02 DIAGNOSIS — S0990XA Unspecified injury of head, initial encounter: Secondary | ICD-10-CM

## 2016-10-02 DIAGNOSIS — Y939 Activity, unspecified: Secondary | ICD-10-CM | POA: Insufficient documentation

## 2016-10-02 DIAGNOSIS — Z79899 Other long term (current) drug therapy: Secondary | ICD-10-CM | POA: Insufficient documentation

## 2016-10-02 DIAGNOSIS — E119 Type 2 diabetes mellitus without complications: Secondary | ICD-10-CM | POA: Insufficient documentation

## 2016-10-02 MED ORDER — IBUPROFEN 200 MG PO TABS: 600.0000 mg | ORAL_TABLET | Freq: Once | ORAL | Status: DC
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
## 2016-10-03 NOTE — ED Notes (Signed)
Patient is alert and oriented x3.  He was given DC instructions and follow up visit instructions.  Patient gave verbal understanding.  He was DC ambulatory under his own power to home.  V/S stable.  He was not showing any signs of distress on DC 

## 2016-10-03 NOTE — ED Notes (Signed)
Patient left before receiving medication.

## 2016-10-21 ENCOUNTER — Encounter: Payer: Self-pay | Admitting: Internal Medicine

## 2016-10-22 ENCOUNTER — Encounter: Payer: Self-pay | Admitting: Internal Medicine

## 2016-10-25 ENCOUNTER — Encounter: Payer: Self-pay | Admitting: Internal Medicine

## 2016-11-18 ENCOUNTER — Telehealth: Payer: Self-pay | Admitting: Internal Medicine

## 2016-11-18 MED ORDER — LISINOPRIL 5 MG PO TABS: 5.0000 mg | ORAL_TABLET | Freq: Every day | ORAL | 0 refills | Status: DC

## 2016-11-18 NOTE — Telephone Encounter (Signed)
PT need a refill for lisinopril (PRINIVIL,ZESTRIL) 5 MG tablet  since he has none, he got an appt with the new PT 12/20/16 to follow, can you please auth a refill until then, please follow with pt

## 2016-11-18 NOTE — Telephone Encounter (Signed)
Refilled x 30 days - patient must keep office visit for any further refills.

## 2016-11-19 MED FILL — LISINOPRIL 5 MG TAB: 5 | 30 days supply | Qty: 30 | Fill #0

## 2016-12-15 ENCOUNTER — Telehealth: Payer: Self-pay | Admitting: Internal Medicine

## 2016-12-15 NOTE — Telephone Encounter (Signed)
Pt called stating that he needed a refill for his meds and was told that he needed an appt before he was prescribed a refill. Checked Epic, advised pt that he has appt scheduled for 12/21/16 at 3:45 pm, pt stated he can't make appt bc he has to work. Advised pt that there is a note that he must have appt for refills, pt asked to speak to a nurse, I transferred call to [facility]. CMA Julius Bowelsollock explained that note in Epic that states no more refills without an OV, that he may go to ED or Urgent Care and that since pt cannot make it to his appt we will cancel his appt and he stated that he will call back when he is able to come in.

## 2016-12-21 ENCOUNTER — Ambulatory Visit: Payer: No Typology Code available for payment source | Admitting: Internal Medicine

## 2017-02-26 ENCOUNTER — Emergency Department (HOSPITAL_COMMUNITY): Payer: Self-pay

## 2017-02-26 ENCOUNTER — Observation Stay (HOSPITAL_COMMUNITY)
Admission: EM | Admit: 2017-02-26 | Discharge: 2017-02-27 | Payer: Self-pay | Attending: Orthopedic Surgery | Admitting: Orthopedic Surgery

## 2017-02-26 ENCOUNTER — Ambulatory Visit: Admit: 2017-02-26 | Payer: No Typology Code available for payment source | Admitting: Orthopedic Surgery

## 2017-02-26 ENCOUNTER — Encounter (HOSPITAL_COMMUNITY): Admission: EM | Payer: Self-pay | Source: Home / Self Care | Attending: Emergency Medicine

## 2017-02-26 ENCOUNTER — Emergency Department (HOSPITAL_COMMUNITY): Payer: Self-pay | Admitting: Certified Registered"

## 2017-02-26 ENCOUNTER — Encounter (HOSPITAL_COMMUNITY): Payer: Self-pay

## 2017-02-26 DIAGNOSIS — S63282A Dislocation of proximal interphalangeal joint of right middle finger, initial encounter: Principal | ICD-10-CM | POA: Insufficient documentation

## 2017-02-26 DIAGNOSIS — S62612B Displaced fracture of proximal phalanx of right middle finger, initial encounter for open fracture: Secondary | ICD-10-CM | POA: Insufficient documentation

## 2017-02-26 DIAGNOSIS — F17200 Nicotine dependence, unspecified, uncomplicated: Secondary | ICD-10-CM | POA: Insufficient documentation

## 2017-02-26 DIAGNOSIS — E119 Type 2 diabetes mellitus without complications: Secondary | ICD-10-CM | POA: Insufficient documentation

## 2017-02-26 DIAGNOSIS — I10 Essential (primary) hypertension: Secondary | ICD-10-CM | POA: Insufficient documentation

## 2017-02-26 DIAGNOSIS — S61209A Unspecified open wound of unspecified finger without damage to nail, initial encounter: Secondary | ICD-10-CM | POA: Diagnosis present

## 2017-02-26 DIAGNOSIS — S61212A Laceration without foreign body of right middle finger without damage to nail, initial encounter: Secondary | ICD-10-CM

## 2017-02-26 DIAGNOSIS — S63252A Unspecified dislocation of right middle finger, initial encounter: Secondary | ICD-10-CM

## 2017-02-26 DIAGNOSIS — S63259A Unspecified dislocation of unspecified finger, initial encounter: Secondary | ICD-10-CM

## 2017-02-26 HISTORY — PX: I & D EXTREMITY: SHX5045

## 2017-02-26 SURGERY — IRRIGATION AND DEBRIDEMENT EXTREMITY
Anesthesia: General | Site: Hand | Laterality: Right

## 2017-02-26 MED ORDER — ONDANSETRON HCL 4 MG PO TABS: 4.0000 mg | ORAL_TABLET | Freq: Four times a day (QID) | ORAL | Status: DC | PRN

## 2017-02-26 MED ORDER — CEFAZOLIN SODIUM-DEXTROSE 2-4 GM/100ML-% IV SOLN
2.0000 g | Freq: Once | INTRAVENOUS | Status: AC
  Administered 2017-02-26: 2 g via INTRAVENOUS
  Filled 2017-02-26: qty 100

## 2017-02-26 MED ORDER — ONDANSETRON HCL 4 MG/2ML IJ SOLN: 4.0000 mg | Freq: Four times a day (QID) | INTRAMUSCULAR | Status: DC | PRN

## 2017-02-26 MED ORDER — FENTANYL CITRATE (PF) 100 MCG/2ML IJ SOLN: 25.0000 ug | INTRAMUSCULAR | Status: DC | PRN

## 2017-02-26 MED ORDER — CEPHALEXIN 500 MG PO CAPS
500.0000 mg | ORAL_CAPSULE | Freq: Once | ORAL | Status: DC
  Filled 2017-02-26: qty 1

## 2017-02-26 MED ORDER — CEFAZOLIN SODIUM-DEXTROSE 1-4 GM/50ML-% IV SOLN
1.0000 g | INTRAVENOUS | Status: AC
  Administered 2017-02-26: 1 g via INTRAVENOUS
  Filled 2017-02-26: qty 50

## 2017-02-26 MED ORDER — VITAMIN C 500 MG PO TABS
1000.0000 mg | ORAL_TABLET | Freq: Every day | ORAL | Status: DC
  Administered 2017-02-27: 1000 mg via ORAL
  Filled 2017-02-26 (×2): qty 2

## 2017-02-26 MED ORDER — LACTATED RINGERS IV SOLN
INTRAVENOUS | Status: DC | PRN
  Administered 2017-02-26 (×2): via INTRAVENOUS

## 2017-02-26 MED ORDER — SODIUM CHLORIDE 0.45 % IV SOLN
INTRAVENOUS | Status: DC
  Administered 2017-02-26: 18:00:00 via INTRAVENOUS

## 2017-02-26 MED ORDER — LIDOCAINE 2% (20 MG/ML) 5 ML SYRINGE
INTRAMUSCULAR | Status: DC | PRN
  Administered 2017-02-26: 60 mg via INTRAVENOUS

## 2017-02-26 MED ORDER — ONDANSETRON HCL 4 MG/2ML IJ SOLN
INTRAMUSCULAR | Status: DC | PRN
  Administered 2017-02-26: 4 mg via INTRAVENOUS

## 2017-02-26 MED ORDER — LIDOCAINE 2% (20 MG/ML) 5 ML SYRINGE
INTRAMUSCULAR | Status: AC
  Filled 2017-02-26: qty 5

## 2017-02-26 MED ORDER — DEXAMETHASONE SODIUM PHOSPHATE 10 MG/ML IJ SOLN
INTRAMUSCULAR | Status: DC | PRN
  Administered 2017-02-26: 8 mg via INTRAVENOUS

## 2017-02-26 MED ORDER — BUPIVACAINE HCL (PF) 0.5 % IJ SOLN
INTRAMUSCULAR | Status: AC
  Filled 2017-02-26: qty 30

## 2017-02-26 MED ORDER — MORPHINE SULFATE (PF) 2 MG/ML IV SOLN: 1.0000 mg | INTRAVENOUS | Status: DC | PRN

## 2017-02-26 MED ORDER — PHENYLEPHRINE 40 MCG/ML (10ML) SYRINGE FOR IV PUSH (FOR BLOOD PRESSURE SUPPORT)
PREFILLED_SYRINGE | INTRAVENOUS | Status: DC | PRN
  Administered 2017-02-26 (×5): 80 ug via INTRAVENOUS

## 2017-02-26 MED ORDER — TETANUS-DIPHTH-ACELL PERTUSSIS 5-2.5-18.5 LF-MCG/0.5 IM SUSP
0.5000 mL | Freq: Once | INTRAMUSCULAR | Status: AC
  Administered 2017-02-26: 0.5 mL via INTRAMUSCULAR
  Filled 2017-02-26: qty 0.5

## 2017-02-26 MED ORDER — BUPIVACAINE HCL (PF) 0.25 % IJ SOLN
INTRAMUSCULAR | Status: DC | PRN
  Administered 2017-02-26: 10 mL

## 2017-02-26 MED ORDER — LACTATED RINGERS IV SOLN
INTRAVENOUS | Status: DC
  Administered 2017-02-26: 09:00:00 via INTRAVENOUS

## 2017-02-26 MED ORDER — MORPHINE SULFATE (PF) 4 MG/ML IV SOLN: 1.0000 mg | INTRAVENOUS | Status: DC | PRN

## 2017-02-26 MED ORDER — SUGAMMADEX SODIUM 200 MG/2ML IV SOLN
INTRAVENOUS | Status: AC
  Filled 2017-02-26: qty 2

## 2017-02-26 MED ORDER — MIDAZOLAM HCL 2 MG/2ML IJ SOLN
INTRAMUSCULAR | Status: AC
  Filled 2017-02-26: qty 2

## 2017-02-26 MED ORDER — PROPOFOL 10 MG/ML IV BOLUS
INTRAVENOUS | Status: DC | PRN
  Administered 2017-02-26: 50 mg via INTRAVENOUS
  Administered 2017-02-26: 200 mg via INTRAVENOUS

## 2017-02-26 MED ORDER — EPHEDRINE SULFATE-NACL 50-0.9 MG/10ML-% IV SOSY
PREFILLED_SYRINGE | INTRAVENOUS | Status: DC | PRN
Start: 2017-02-26 — End: 2017-02-26
  Administered 2017-02-26 (×3): 5 mg via INTRAVENOUS

## 2017-02-26 MED ORDER — AMOXICILLIN-POT CLAVULANATE 875-125 MG PO TABS
1.0000 | ORAL_TABLET | Freq: Once | ORAL | Status: AC
  Administered 2017-02-26: 1 via ORAL
  Filled 2017-02-26: qty 1

## 2017-02-26 MED ORDER — ROCURONIUM BROMIDE 10 MG/ML (PF) SYRINGE
PREFILLED_SYRINGE | INTRAVENOUS | Status: AC
  Filled 2017-02-26: qty 5

## 2017-02-26 MED ORDER — OXYCODONE HCL 5 MG PO TABS
5.0000 mg | ORAL_TABLET | ORAL | Status: DC | PRN
  Administered 2017-02-26 – 2017-02-27 (×6): 10 mg via ORAL
  Filled 2017-02-26 (×6): qty 2

## 2017-02-26 MED ORDER — SUCCINYLCHOLINE CHLORIDE 200 MG/10ML IV SOSY
PREFILLED_SYRINGE | INTRAVENOUS | Status: DC | PRN
  Administered 2017-02-26: 100 mg via INTRAVENOUS

## 2017-02-26 MED ORDER — LIDOCAINE HCL (PF) 1 % IJ SOLN
10.0000 mL | Freq: Once | INTRAMUSCULAR | Status: AC
  Administered 2017-02-26: 10 mL
  Filled 2017-02-26: qty 30

## 2017-02-26 MED ORDER — METHOCARBAMOL 500 MG PO TABS
500.0000 mg | ORAL_TABLET | Freq: Four times a day (QID) | ORAL | Status: DC | PRN
  Administered 2017-02-26 – 2017-02-27 (×3): 500 mg via ORAL
  Filled 2017-02-26 (×3): qty 1

## 2017-02-26 MED ORDER — PROMETHAZINE HCL 25 MG/ML IJ SOLN: 6.2500 mg | INTRAMUSCULAR | Status: DC | PRN

## 2017-02-26 MED ORDER — FENTANYL CITRATE (PF) 100 MCG/2ML IJ SOLN
INTRAMUSCULAR | Status: DC | PRN
  Administered 2017-02-26 (×2): 50 ug via INTRAVENOUS
  Administered 2017-02-26: 25 ug via INTRAVENOUS

## 2017-02-26 MED ORDER — SENNA 8.6 MG PO TABS
1.0000 | ORAL_TABLET | Freq: Two times a day (BID) | ORAL | Status: DC
  Administered 2017-02-27: 8.6 mg via ORAL
  Filled 2017-02-26 (×2): qty 1

## 2017-02-26 MED ORDER — METHOCARBAMOL 1000 MG/10ML IJ SOLN: 500.0000 mg | Freq: Four times a day (QID) | INTRAVENOUS | Status: DC | PRN

## 2017-02-26 MED ORDER — CEFAZOLIN SODIUM-DEXTROSE 1-4 GM/50ML-% IV SOLN
1.0000 g | Freq: Three times a day (TID) | INTRAVENOUS | Status: DC
  Administered 2017-02-26 – 2017-02-27 (×2): 1 g via INTRAVENOUS
  Filled 2017-02-26 (×3): qty 50

## 2017-02-26 MED ORDER — FENTANYL CITRATE (PF) 250 MCG/5ML IJ SOLN
INTRAMUSCULAR | Status: AC
  Filled 2017-02-26: qty 5

## 2017-02-26 MED ORDER — MIDAZOLAM HCL 5 MG/5ML IJ SOLN
INTRAMUSCULAR | Status: DC | PRN
  Administered 2017-02-26: 2 mg via INTRAVENOUS

## 2017-02-26 MED ORDER — ONDANSETRON HCL 4 MG/2ML IJ SOLN
INTRAMUSCULAR | Status: AC
  Filled 2017-02-26: qty 2

## 2017-02-26 MED ORDER — SUCCINYLCHOLINE CHLORIDE 200 MG/10ML IV SOSY
PREFILLED_SYRINGE | INTRAVENOUS | Status: AC
  Filled 2017-02-26: qty 10

## 2017-02-26 MED ORDER — LISINOPRIL 5 MG PO TABS
5.0000 mg | ORAL_TABLET | Freq: Every day | ORAL | Status: DC
  Administered 2017-02-27: 5 mg via ORAL
  Filled 2017-02-26: qty 1

## 2017-02-26 MED ORDER — DEXAMETHASONE SODIUM PHOSPHATE 10 MG/ML IJ SOLN
INTRAMUSCULAR | Status: AC
  Filled 2017-02-26: qty 1

## 2017-02-26 MED ORDER — SODIUM CHLORIDE 0.9 % IR SOLN
Status: DC | PRN
  Administered 2017-02-26: 3000 mL

## 2017-02-26 MED ORDER — TRAMADOL HCL 50 MG PO TABS: 50.0000 mg | ORAL_TABLET | Freq: Once | ORAL | Status: DC

## 2017-02-26 MED ORDER — HYDROMORPHONE HCL 1 MG/ML IJ SOLN
0.5000 mg | Freq: Once | INTRAMUSCULAR | Status: AC
  Administered 2017-02-26: 0.5 mg via INTRAMUSCULAR
  Filled 2017-02-26: qty 1

## 2017-02-26 SURGICAL SUPPLY — 43 items
BANDAGE ACE 4X5 VEL STRL LF (GAUZE/BANDAGES/DRESSINGS) ×3 IMPLANT
BANDAGE COBAN STERILE 2 (GAUZE/BANDAGES/DRESSINGS) ×2 IMPLANT
BNDG COHESIVE 1X5 TAN STRL LF (GAUZE/BANDAGES/DRESSINGS) ×2 IMPLANT
BNDG CONFORM 2 STRL LF (GAUZE/BANDAGES/DRESSINGS) ×4 IMPLANT
BNDG GAUZE ELAST 4 BULKY (GAUZE/BANDAGES/DRESSINGS) ×3 IMPLANT
CORDS BIPOLAR (ELECTRODE) ×3 IMPLANT
COVER SURGICAL LIGHT HANDLE (MISCELLANEOUS) ×3 IMPLANT
CUFF TOURNIQUET SINGLE 18IN (TOURNIQUET CUFF) ×3 IMPLANT
CUFF TOURNIQUET SINGLE 24IN (TOURNIQUET CUFF) IMPLANT
DRSG ADAPTIC 3X8 NADH LF (GAUZE/BANDAGES/DRESSINGS) ×3 IMPLANT
DRSG EMULSION OIL 3X3 NADH (GAUZE/BANDAGES/DRESSINGS) ×4 IMPLANT
GAUZE SPONGE 4X4 12PLY STRL (GAUZE/BANDAGES/DRESSINGS) ×3 IMPLANT
GAUZE XEROFORM 1X8 LF (GAUZE/BANDAGES/DRESSINGS) ×3 IMPLANT
GLOVE BIOGEL M 8.0 STRL (GLOVE) ×3 IMPLANT
GLOVE SS BIOGEL STRL SZ 8 (GLOVE) ×1 IMPLANT
GLOVE SUPERSENSE BIOGEL SZ 8 (GLOVE) ×2
GOWN STRL REUS W/ TWL LRG LVL3 (GOWN DISPOSABLE) ×1 IMPLANT
GOWN STRL REUS W/ TWL XL LVL3 (GOWN DISPOSABLE) ×2 IMPLANT
GOWN STRL REUS W/TWL LRG LVL3 (GOWN DISPOSABLE) ×3
GOWN STRL REUS W/TWL XL LVL3 (GOWN DISPOSABLE) ×6
KIT BASIN OR (CUSTOM PROCEDURE TRAY) ×3 IMPLANT
KIT ROOM TURNOVER OR (KITS) ×3 IMPLANT
MANIFOLD NEPTUNE II (INSTRUMENTS) ×3 IMPLANT
NDL HYPO 25GX1X1/2 BEV (NEEDLE) IMPLANT
NEEDLE HYPO 25GX1X1/2 BEV (NEEDLE) ×3 IMPLANT
NS IRRIG 1000ML POUR BTL (IV SOLUTION) ×3 IMPLANT
PACK ORTHO EXTREMITY (CUSTOM PROCEDURE TRAY) ×3 IMPLANT
PAD ARMBOARD 7.5X6 YLW CONV (MISCELLANEOUS) ×3 IMPLANT
PAD CAST 4YDX4 CTTN HI CHSV (CAST SUPPLIES) ×1 IMPLANT
PADDING CAST COTTON 4X4 STRL (CAST SUPPLIES) ×3
SCRUB BETADINE 4OZ XXX (MISCELLANEOUS) ×3 IMPLANT
SOL PREP POV-IOD 4OZ 10% (MISCELLANEOUS) ×3 IMPLANT
SPONGE LAP 4X18 X RAY DECT (DISPOSABLE) ×3 IMPLANT
SUT PROLENE 4 0 PS 2 18 (SUTURE) ×2 IMPLANT
SUT PROLENE 5 0 PS 2 (SUTURE) ×2 IMPLANT
SWAB CULTURE ESWAB REG 1ML (MISCELLANEOUS) IMPLANT
SYR CONTROL 10ML LL (SYRINGE) ×2 IMPLANT
TOWEL OR 17X24 6PK STRL BLUE (TOWEL DISPOSABLE) ×3 IMPLANT
TOWEL OR 17X26 10 PK STRL BLUE (TOWEL DISPOSABLE) ×3 IMPLANT
TUBE CONNECTING 12'X1/4 (SUCTIONS) ×1
TUBE CONNECTING 12X1/4 (SUCTIONS) ×2 IMPLANT
WATER STERILE IRR 1000ML POUR (IV SOLUTION) ×3 IMPLANT
YANKAUER SUCT BULB TIP NO VENT (SUCTIONS) ×3 IMPLANT
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
## 2017-02-26 NOTE — Transfer of Care (Signed)
Immediate Anesthesia Transfer of Care Note  Patient: Chad Wolfe  Procedure(s) Performed: Procedure(s): IRRIGATION AND DEBRIDEMENT AND REDUCTION OF RIGHT MIDDLE FINGER (Right)  Patient Location: PACU  Anesthesia Type:General  Level of Consciousness: awake, alert  and patient cooperative  Airway & Oxygen Therapy: Patient Spontanous Breathing and Patient connected to nasal cannula oxygen  Post-op Assessment: Report given to RN, Post -op Vital signs reviewed and stable and Patient moving all extremities X 4  Post vital signs: Reviewed and stable  Last Vitals:  Vitals:   02/26/17 0319 02/26/17 0759  BP: 127/87 134/85  Pulse: 96 93  Resp: 18 16  Temp: 37.2 C   SpO2: 97% 100%    Last Pain:  Vitals:   02/26/17 0319  TempSrc: Oral  PainSc:          Complications: No apparent anesthesia complications
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
## 2017-02-26 NOTE — Op Note (Signed)
See dictation#[id number] SP open relocation and repair right middle finger PIP joint with frag excision right middle phalanx Emaly Boschert MD

## 2017-02-26 NOTE — ED Notes (Signed)
[facility] notified of need for transport to shortstay at [facility]

## 2017-02-26 NOTE — Anesthesia Postprocedure Evaluation (Signed)
Anesthesia Post Note  Patient: HOA DERISO  Procedure(s) Performed: Procedure(s) (LRB): IRRIGATION AND DEBRIDEMENT AND REDUCTION OF RIGHT MIDDLE FINGER (Right)     Patient location during evaluation: PACU Anesthesia Type: General Level of consciousness: awake and alert Pain management: pain level controlled Vital Signs Assessment: post-procedure vital signs reviewed and stable Respiratory status: spontaneous breathing, nonlabored ventilation, respiratory function stable and patient connected to nasal cannula oxygen Cardiovascular status: blood pressure returned to baseline and stable Postop Assessment: no apparent nausea or vomiting Anesthetic complications: no    Last Vitals:  Vitals:   02/26/17 1445 02/26/17 1449  BP:  127/86  Pulse: 71 78  Resp: 16 18  Temp: 36.7 C   SpO2: 99% 98%    Last Pain:  Vitals:   02/26/17 0319  TempSrc: Oral  PainSc:                  Kennieth Rad

## 2017-02-26 NOTE — Anesthesia Preprocedure Evaluation (Addendum)
Anesthesia Evaluation  Patient identified by MRN, date of birth, ID band Patient awake    Reviewed: Allergy & Precautions, NPO status , Patient's Chart, lab work & pertinent test results  Airway Mallampati: II  TM Distance: >3 FB Neck ROM: Full    Dental  (+) Dental Advisory Given   Pulmonary Current Smoker,    breath sounds clear to auscultation       Cardiovascular hypertension, Pt. on medications  Rhythm:Regular Rate:Normal     Neuro/Psych negative neurological ROS     GI/Hepatic PUD, (+) Hepatitis -, C  Endo/Other  diabetes, Type 2  Renal/GU      Musculoskeletal   Abdominal   Peds  Hematology   Anesthesia Other Findings   Reproductive/Obstetrics                            Lab Results  Component Value Date   WBC 8.6 09/26/2015   HGB 15.0 09/26/2015   HCT 43.0 09/26/2015   MCV 92.1 09/26/2015   PLT 271 09/26/2015   Lab Results  Component Value Date   CREATININE 0.97 09/26/2015   BUN <5 (L) 09/26/2015   NA 139 09/26/2015   K 3.3 (L) 09/26/2015   CL 107 09/26/2015   CO2 22 09/26/2015    Anesthesia Physical Anesthesia Plan  ASA: II  Anesthesia Plan: General   Post-op Pain Management:    Induction: Intravenous  PONV Risk Score and Plan: 1 and Ondansetron, Dexamethasone and Treatment may vary due to age or medical condition  Airway Management Planned: Oral ETT  Additional Equipment:   Intra-op Plan:   Post-operative Plan: Extubation in OR  Informed Consent: I have reviewed the patients History and Physical, chart, labs and discussed the procedure including the risks, benefits and alternatives for the proposed anesthesia with the patient or authorized representative who has indicated his/her understanding and acceptance.   Dental advisory given  Plan Discussed with: CRNA  Anesthesia Plan Comments:         Anesthesia Quick Evaluation

## 2017-02-26 NOTE — Anesthesia Procedure Notes (Signed)
Procedure Name: Intubation Date/Time: 02/26/2017 12:12 PM Performed by: Julian Reil Pre-anesthesia Checklist: Patient identified, Emergency Drugs available, Suction available, Patient being monitored and Timeout performed Patient Re-evaluated:Patient Re-evaluated prior to induction Oxygen Delivery Method: Circle system utilized Preoxygenation: Pre-oxygenation with 100% oxygen Induction Type: IV induction Ventilation: Mask ventilation without difficulty Laryngoscope Size: Miller and 3 Grade View: Grade I Tube type: Oral Tube size: 7.5 mm Number of attempts: 1 Airway Equipment and Method: Stylet Placement Confirmation: ETT inserted through vocal cords under direct vision,  positive ETCO2 and breath sounds checked- equal and bilateral Secured at: 23 cm Tube secured with: Tape Dental Injury: Teeth and Oropharynx as per pre-operative assessment  Comments: Noted poor dentition pre-op, noted upper right front tooth chipped, some missing, lower left incisor loose per patient report. 4x4s bite block used.

## 2017-02-26 NOTE — ED Notes (Signed)
Bed: WTR9 Expected date:  Expected time:  Means of arrival:  Comments: 

## 2017-02-27 ENCOUNTER — Encounter (HOSPITAL_COMMUNITY): Payer: Self-pay | Admitting: Orthopedic Surgery

## 2017-02-27 MED ORDER — OXYCODONE HCL 5 MG PO TABS: 5.0000 mg | ORAL_TABLET | ORAL | 0 refills | Status: AC | PRN

## 2017-02-27 MED ORDER — SULFAMETHOXAZOLE-TRIMETHOPRIM 800-160 MG PO TABS: 1.0000 | ORAL_TABLET | Freq: Two times a day (BID) | ORAL | 0 refills | Status: DC
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
## 2017-02-27 NOTE — Discharge Summary (Signed)
Physician Discharge Summary  Patient ID: Chad Wolfe MRN: [id number] DOB/AGE: Dec 15, 1965 51 y.o.  Admit date: 02/26/2017 Discharge date:   Admission Diagnoses: open right middle finger PIP dislocation Past Medical History:  Diagnosis Date  . Diabetes mellitus type 2 in nonobese (HCC) 01/21/2016  . Gastric ulcer   . Hepatitis C   . Hyperlipidemia   . Hypertension   . Substance abuse     Discharge Diagnoses:  Active Problems:   Open finger dislocation   Surgeries: Procedure(s): IRRIGATION AND DEBRIDEMENT AND REDUCTION OF RIGHT MIDDLE FINGER on 02/26/2017    Consultants:   Discharged Condition: Improved  Hospital Course: Chad Wolfe is an 51 y.o. male who was admitted 02/26/2017 with a chief complaint of Chief Complaint  Patient presents with  . Right middle finger injury  , and found to have a diagnosis of open right middle finger PIP dislocation.  They were brought to the operating room on 02/26/2017 and underwent Procedure(s): IRRIGATION AND DEBRIDEMENT AND REDUCTION OF RIGHT MIDDLE FINGER.    They were given perioperative antibiotics: Anti-infectives    Start     Dose/Rate Route Frequency Ordered Stop   02/27/17 0000  ceFAZolin (ANCEF) IVPB 1 g/50 mL premix     1 g 100 mL/hr over 30 Minutes Intravenous Every 8 hours 02/26/17 1322     02/27/17 0000  sulfamethoxazole-trimethoprim (BACTRIM DS,SEPTRA DS) 800-160 MG tablet     1 tablet Oral 2 times daily 02/27/17 1148     02/26/17 1800  ceFAZolin (ANCEF) IVPB 1 g/50 mL premix     1 g 100 mL/hr over 30 Minutes Intravenous NOW 02/26/17 1322 02/26/17 1827   02/26/17 0930  ceFAZolin (ANCEF) IVPB 2g/100 mL premix     2 g 200 mL/hr over 30 Minutes Intravenous  Once 02/26/17 0923 02/26/17 1221   02/26/17 0430  amoxicillin-clavulanate (AUGMENTIN) 875-125 MG per tablet 1 tablet     1 tablet Oral  Once 02/26/17 0422 02/26/17 0430   02/26/17 0415  cephALEXin (KEFLEX) capsule 500 mg  Status:  Discontinued     500 mg  Oral  Once 02/26/17 0410 02/26/17 0422    .  They were given sequential compression devices, early ambulation, and Other (comment) for DVT prophylaxis.  Recent vital signs: Patient Vitals for the past 24 hrs:  BP Temp Temp src Pulse Resp SpO2 Height Weight  02/27/17 0510 (!) 110/59 98.2 F (36.8 C) - 62 17 96 % - -  02/26/17 2143 108/64 98.4 F (36.9 C) Oral 73 17 95 % - -  02/26/17 1630 135/86 98.2 F (36.8 C) Oral 75 17 97 %  (1.753 m) 68 kg (150 lb)  02/26/17 1620 - 98.6 F (37 C) - 76 18 96 % - -  02/26/17 1547 137/82 - - 77 (!) 31 97 % - -  02/26/17 1545 - - - 80 20 99 % - -  02/26/17 1449 127/86 - - 78 18 98 % - -  02/26/17 1445 - 98.1 F (36.7 C) - 71 16 99 % - -  02/26/17 1432 (!) 135/91 - - 77 20 97 % - -  02/26/17 1417 130/89 - - 85 (!) 21 97 % - -  02/26/17 1415 - - - 82 19 98 % - -  02/26/17 1402 (!) 134/91 - - 86 (!) 21 98 % - -  02/26/17 1347 134/84 - - 98 (!) 23 95 % - -  02/26/17 1345 - - - 98 (!) 21  97 % - -  02/26/17 1332 127/84 - - (!) 105 (!) 26 98 % - -  02/26/17 1317 126/74 97.8 F (36.6 C) - - (!) 24 - - -  .  Recent laboratory studies: Dg Finger Middle Right  Result Date: 02/26/2017 CLINICAL DATA:  Postreduction right middle finger EXAM: RIGHT MIDDLE FINGER 2+V COMPARISON:  02/26/2017 FINDINGS: Interval reduction of the previous dislocation at the proximal interphalangeal joint of the right third finger. There is residual dorsal angulation of the middle phalanx with respect to the proximal phalanx suggesting an anterior ligamentous injury. No fractures identified. Mild soft tissue swelling. IMPRESSION: Interval reduction of previous right third proximal interphalangeal joint dislocation. Residual dorsal angulation of the middle phalanx with respect to the proximal phalanx suggesting an anterior ligamentous injury. Electronically Signed   By: Burman Nieves M.D.   On: 02/26/2017 06:01   Dg Finger Middle Right  Result Date: 02/26/2017 CLINICAL DATA:   Injury EXAM: RIGHT MIDDLE FINGER 2+V COMPARISON:  11/04/2010 FINDINGS: Dorsal dislocation at the third PIP joint. Tiny fracture along the head of the third proximal phalanx. IMPRESSION: Dorsal dislocation at the third DIP joint with probable small fracture adjacent to the head of the third proximal phalanx Electronically Signed   By: Jasmine Pang M.D.   On: 02/26/2017 03:31    Discharge Medications:   Allergies as of 02/27/2017      Reactions   Ibuprofen Other (See Comments)   Stomach upset   Aspirin Other (See Comments)   Other Other (See Comments)   MD told him not to take [facility] due to Hep C   Tylenol [acetaminophen] Other (See Comments)   HEP C      Medication List    STOP taking these medications   chlorhexidine 4 % external liquid Commonly known as:  HIBICLENS     TAKE these medications   lisinopril 5 MG tablet Commonly known as:  PRINIVIL,ZESTRIL Take 1 tablet (5 mg total) by mouth daily. No more refills - must have OV   oxyCODONE 5 MG immediate release tablet Commonly known as:  ROXICODONE Take 1 tablet (5 mg total) by mouth every 4 (four) hours as needed.   sulfamethoxazole-trimethoprim 800-160 MG tablet Commonly known as:  BACTRIM DS,SEPTRA DS Take 1 tablet by mouth 2 (two) times daily.            Discharge Care Instructions        Start     Ordered   02/27/17 0000  sulfamethoxazole-trimethoprim (BACTRIM DS,SEPTRA DS) 800-160 MG tablet  2 times daily     02/27/17 1148   02/27/17 0000  oxyCODONE (ROXICODONE) 5 MG immediate release tablet  Every 4 hours PRN     02/27/17 1148      Diagnostic Studies: Dg Finger Middle Right  Result Date: 02/26/2017 CLINICAL DATA:  Postreduction right middle finger EXAM: RIGHT MIDDLE FINGER 2+V COMPARISON:  02/26/2017 FINDINGS: Interval reduction of the previous dislocation at the proximal interphalangeal joint of the right third finger. There is residual dorsal angulation of the middle phalanx with respect to the proximal  phalanx suggesting an anterior ligamentous injury. No fractures identified. Mild soft tissue swelling. IMPRESSION: Interval reduction of previous right third proximal interphalangeal joint dislocation. Residual dorsal angulation of the middle phalanx with respect to the proximal phalanx suggesting an anterior ligamentous injury. Electronically Signed   By: Burman Nieves M.D.   On: 02/26/2017 06:01   Dg Finger Middle Right  Result Date: 02/26/2017 CLINICAL DATA:  Injury EXAM: RIGHT MIDDLE FINGER 2+V COMPARISON:  11/04/2010 FINDINGS: Dorsal dislocation at the third PIP joint. Tiny fracture along the head of the third proximal phalanx. IMPRESSION: Dorsal dislocation at the third DIP joint with probable small fracture adjacent to the head of the third proximal phalanx Electronically Signed   By: Jasmine Pang M.D.   On: 02/26/2017 03:31    They benefited maximally from their hospital stay and there were no complications.     Disposition: 01-Home or Self Care   Status post irrigation debridement open PIP fracture dislocation with repair reconstruction  Discharge medicines Bactrim DS 1 by mouth twice a day and oxycodone when necessary pain  See me in my office in 12 days & keep the bandage clean and dry  Signed: Karen Chafe 02/27/2017, 11:48 AM
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
## 2017-02-27 NOTE — Discharge Instructions (Signed)
Please keep bandage clean and dry. You may try to move the finger with in the confines of the splint. We would like see her in 12-14 days. You have antibiotics to take and we encourage you to take these until they're finished to prevent infection  We recommend that you to take vitamin C 1000 mg a day to promote healing. We also recommend that if you require  pain medicine that you take a stool softener to prevent constipation as most pain medicines will have constipation side effects. We recommend either Peri-Colace or Senokot and recommend that you also consider adding MiraLAX as well to prevent the constipation affects from pain medicine if you are required to use them. These medicines are over the counter and may be purchased at a local pharmacy. A cup of yogurt and a probiotic can also be helpful during the recovery process as the medicines can disrupt your intestinal environment. Keep bandage clean and dry.  Call for any problems.  No smoking.  Criteria for driving a car: you should be off your pain medicine for 7-8 hours, able to drive one handed(confident), thinking clearly and feeling able in your judgement to drive. Continue elevation as it will decrease swelling.  If instructed by MD move your fingers within the confines of the bandage/splint.  Use ice if instructed by your MD. Call immediately for any sudden loss of feeling in your hand/arm or change in functional abilities of the extremity.

## 2017-02-28 MED FILL — SULFAMETHOXAZOLE-TMP DS TAB: 800-160 | 14 days supply | Qty: 28 | Fill #0

## 2017-02-28 MED FILL — oxyCODONE HCL 5 MG TABS: 5 | 5 days supply | Qty: 20 | Fill #0

## 2017-03-11 ENCOUNTER — Emergency Department (HOSPITAL_COMMUNITY)
Admission: EM | Admit: 2017-03-11 | Discharge: 2017-03-11 | Disposition: A | Discharge status: Home / Self Care | Payer: No Typology Code available for payment source | Attending: Physician Assistant | Admitting: Physician Assistant

## 2017-03-11 ENCOUNTER — Encounter (HOSPITAL_COMMUNITY): Payer: Self-pay | Admitting: Emergency Medicine

## 2017-03-11 DIAGNOSIS — Y939 Activity, unspecified: Secondary | ICD-10-CM | POA: Insufficient documentation

## 2017-03-11 DIAGNOSIS — I10 Essential (primary) hypertension: Secondary | ICD-10-CM | POA: Insufficient documentation

## 2017-03-11 DIAGNOSIS — E119 Type 2 diabetes mellitus without complications: Secondary | ICD-10-CM | POA: Insufficient documentation

## 2017-03-11 DIAGNOSIS — Y929 Unspecified place or not applicable: Secondary | ICD-10-CM | POA: Insufficient documentation

## 2017-03-11 DIAGNOSIS — Z79899 Other long term (current) drug therapy: Secondary | ICD-10-CM | POA: Insufficient documentation

## 2017-03-11 DIAGNOSIS — S62612D Displaced fracture of proximal phalanx of right middle finger, subsequent encounter for fracture with routine healing: Secondary | ICD-10-CM | POA: Insufficient documentation

## 2017-03-11 DIAGNOSIS — Z4802 Encounter for removal of sutures: Secondary | ICD-10-CM | POA: Insufficient documentation

## 2017-03-11 DIAGNOSIS — Y999 Unspecified external cause status: Secondary | ICD-10-CM | POA: Insufficient documentation

## 2017-03-11 DIAGNOSIS — X58XXXD Exposure to other specified factors, subsequent encounter: Secondary | ICD-10-CM | POA: Insufficient documentation

## 2017-03-11 DIAGNOSIS — F17200 Nicotine dependence, unspecified, uncomplicated: Secondary | ICD-10-CM | POA: Insufficient documentation

## 2017-03-11 NOTE — ED Triage Notes (Signed)
Pt states he had surgery on the Sept. 24, requesting to have stitches removed.

## 2017-03-11 NOTE — ED Provider Notes (Signed)
[facility]-EMERGENCY DEPT Provider Note   CSN: [id number] Arrival date & time: 03/11/17  1529     History   Chief Complaint Chief Complaint  Patient presents with  . Suture / Staple Removal    HPI  Chad Wolfe is a 51 y.o. male who presents for suture removal. Patient had surgery with Dr. Cheree Ditto and on 9/24 for irrigation and debridement of open PIP fracture dislocation with repair reconstruction. Per discharge instruction pt was to follow up with Dr. Amanda Pea in his office, but pt unclear on instructions. Pt reports he took all but 2 of the bactrim he received at discharge. Pt has not removed bandage or splint since surgery, but reports improvement in pain. Pt denies any fevers or chills. Denies numbness or tingling.       Past Medical History:  Diagnosis Date  . Diabetes mellitus type 2 in nonobese (HCC) 01/21/2016  . Gastric ulcer   . Hepatitis C   . Hyperlipidemia   . Hypertension   . Substance abuse [facility])     Patient Active Problem List   Diagnosis Date Noted  . Open finger dislocation 02/26/2017  . Diabetes mellitus type 2 in nonobese (HCC) 01/21/2016  . Fall from chair 05/15/2015  . Alcohol abuse 05/05/2015  . Pain, dental 03/27/2013  . HTN (hypertension) 04/25/2012  . Chronic hepatitis C virus infection (HCC) 11/15/2007  . ABDOMINAL PAIN-EPIGASTRIC 11/15/2007    Past Surgical History:  Procedure Laterality Date  . I&D EXTREMITY Right 02/26/2017   Procedure: IRRIGATION AND DEBRIDEMENT AND REDUCTION OF RIGHT MIDDLE FINGER;  Surgeon: Dominica Severin, MD;  Location: [facility] OR;  Service: Orthopedics;  Laterality: Right;  . STOMACH SURGERY         Home Medications    Prior to Admission medications   Medication Sig Start Date End Date Taking? Authorizing Provider  lisinopril (PRINIVIL,ZESTRIL) 5 MG tablet Take 1 tablet (5 mg total) by mouth daily. No more refills - must have OV 11/18/16   Jegede, Keane Scrape E, MD  oxyCODONE (ROXICODONE) 5 MG immediate release  tablet Take 1 tablet (5 mg total) by mouth every 4 (four) hours as needed. 02/27/17 02/27/18  Dominica Severin, MD  sulfamethoxazole-trimethoprim (BACTRIM DS,SEPTRA DS) 800-160 MG tablet Take 1 tablet by mouth 2 (two) times daily. 02/27/17   Dominica Severin, MD    Family History Family History  Problem Relation Age of Onset  . Diabetes Father   . Hypertension Father   . Diabetes Sister   . Hypertension Sister   . Diabetes Brother   . Hypertension Brother     Social History Social History  Substance Use Topics  . Smoking status: Current Some Day Smoker  . Smokeless tobacco: Never Used  . Alcohol use 0.0 oz/week     Comment: drank a quart of beer this morning PT DENIES 09/26/15     Allergies   Ibuprofen; Aspirin; Other; and Tylenol [acetaminophen]   Review of Systems Review of Systems  Constitutional: Negative for chills and fever.  Skin: Positive for wound.  Neurological: Negative for weakness and numbness.     Physical Exam Updated Vital Signs BP 116/79 (BP Location: Left Arm)   Pulse 86   Temp 98.2 F (36.8 C) (Oral)   Resp 16   Ht  (1.753 m)   Wt 65.8 kg (145 lb)   SpO2 100%   BMI 21.41 kg/m   Physical Exam  Constitutional: He appears well-developed and well-nourished. No distress.  HENT:  Head: Normocephalic and atraumatic.  Eyes: Right eye exhibits no discharge. Left eye exhibits no discharge.  Pulmonary/Chest: Effort normal. No respiratory distress.  Musculoskeletal:  Surgical incision at PIP of right middle finger, with 8 sutures present, healing well, no evidence of erythema or drainage to suggest infection. Patient has some swelling at the PIP joint, with limited range of motion, patient is unable to completely straighten the joint, but has good strength at the DIP and MTP joints when isolated. Good cap refill, sensation is intact. See picture below  Neurological: He is alert. Coordination normal.  Skin: Skin is warm and dry. Capillary refill takes  less than 2 seconds. He is not diaphoretic.  Psychiatric: He has a normal mood and affect. His behavior is normal.  Nursing note and vitals reviewed.      ED Treatments / Results  Labs (all labs ordered are listed, but only abnormal results are displayed) Labs Reviewed - No data to display  EKG  EKG Interpretation None       Radiology No results found.  Procedures .Suture Removal Date/Time: 03/11/2017 5:20 PM Performed by: Dartha Lodge Authorized by: Dartha Lodge   Consent:    Consent obtained:  Verbal   Consent given by:  Patient   Risks discussed:  Pain Location:    Location:  Upper extremity   Upper extremity location:  Hand   Hand location:  R long finger Procedure details:    Wound appearance:  No signs of infection and good wound healing   Number of sutures removed:  8 Post-procedure details:    Post-removal:  Dressing applied (Patient placed in static finger splint)   Patient tolerance of procedure:  Tolerated well, no immediate complications   (including critical care time)  Medications Ordered in ED Medications - No data to display   Initial Impression / Assessment and Plan / ED Course  I have reviewed the triage vital signs and the nursing notes.  Pertinent labs & imaging results that were available during my care of the patient were reviewed by me and considered in my medical decision making (see chart for details).  Patient presents for suture removal, 12 days after surgery on right middle finger with Dr. Amanda Pea, no signs of infection, some swelling and limited range of motion at the PIP joint. Contacted Dr. Carlos Levering office, instructed to go ahead and remove stitches, place pt in splint and have patient follow-up at his office Monday morning. Patient tolerated suture removal, without any immediate complications. Patient to show up at [facility] office Monday morning, return precautions provided. Pt expresses understanding and agrees with  plan.  Patient discussed with Dr. Corlis Leak, who saw patient as well and agrees with plan.   Final Clinical Impressions(s) / ED Diagnoses   Final diagnoses:  Visit for suture removal    New Prescriptions New Prescriptions   No medications on file     Dartha Lodge, Cordelia Poche 03/11/17 1726    Abelino Derrick, MD 03/11/17 2319

## 2017-03-11 NOTE — Discharge Instructions (Signed)
Please go to Dr. Carlos Levering office on Monday morning, the address is provided on your paperwork, for follow-up of a finger injury. The incision appears to be healing well, keep area clean and dry and remain in finger splint until you're able to see Dr. Amanda Pea. If you develop fevers or chills, pain redness or warmth at the incision site in the meantime please return to the emergency department for evaluation

## 2017-03-11 NOTE — ED Notes (Signed)
Ortho Paged to place finger splint

## 2017-03-15 MED FILL — oxyCODONE HCL 5 MG TABS: 5 | 7 days supply | Qty: 20 | Fill #0

## 2017-03-15 MED FILL — CEPHALEXIN 500 MG CAPSULE: 500 | 14 days supply | Qty: 56 | Fill #0

## 2020-02-18 ENCOUNTER — Emergency Department (HOSPITAL_COMMUNITY)
Admission: EM | Admit: 2020-02-18 | Discharge: 2020-02-18 | Disposition: A | Discharge status: Home / Self Care | Payer: No Typology Code available for payment source | Attending: Emergency Medicine | Admitting: Emergency Medicine

## 2020-02-18 ENCOUNTER — Encounter (HOSPITAL_COMMUNITY): Payer: Self-pay | Admitting: Emergency Medicine

## 2020-02-18 ENCOUNTER — Emergency Department (HOSPITAL_COMMUNITY): Payer: No Typology Code available for payment source

## 2020-02-18 DIAGNOSIS — L089 Local infection of the skin and subcutaneous tissue, unspecified: Secondary | ICD-10-CM

## 2020-02-18 DIAGNOSIS — S61441A Puncture wound with foreign body of right hand, initial encounter: Secondary | ICD-10-CM | POA: Insufficient documentation

## 2020-02-18 DIAGNOSIS — Z79899 Other long term (current) drug therapy: Secondary | ICD-10-CM | POA: Insufficient documentation

## 2020-02-18 DIAGNOSIS — Y9289 Other specified places as the place of occurrence of the external cause: Secondary | ICD-10-CM | POA: Insufficient documentation

## 2020-02-18 DIAGNOSIS — E119 Type 2 diabetes mellitus without complications: Secondary | ICD-10-CM | POA: Insufficient documentation

## 2020-02-18 DIAGNOSIS — Y939 Activity, unspecified: Secondary | ICD-10-CM | POA: Insufficient documentation

## 2020-02-18 DIAGNOSIS — Y999 Unspecified external cause status: Secondary | ICD-10-CM | POA: Insufficient documentation

## 2020-02-18 DIAGNOSIS — I10 Essential (primary) hypertension: Secondary | ICD-10-CM | POA: Insufficient documentation

## 2020-02-18 DIAGNOSIS — X58XXXA Exposure to other specified factors, initial encounter: Secondary | ICD-10-CM | POA: Insufficient documentation

## 2020-02-18 DIAGNOSIS — F17200 Nicotine dependence, unspecified, uncomplicated: Secondary | ICD-10-CM | POA: Insufficient documentation

## 2020-02-18 MED ORDER — NAPROXEN 250 MG PO TABS: 250.0000 mg | ORAL_TABLET | Freq: Two times a day (BID) | ORAL | 0 refills | Status: DC

## 2020-02-18 MED ORDER — CEPHALEXIN 500 MG PO CAPS: 500.0000 mg | ORAL_CAPSULE | Freq: Four times a day (QID) | ORAL | 0 refills | Status: AC

## 2020-02-18 NOTE — ED Provider Notes (Signed)
[facility] EMERGENCY DEPARTMENT Provider Note   CSN: [id number] Arrival date & time: 02/18/20  0848     History Chief Complaint  Patient presents with  . Hand Injury    Chad Wolfe is a 54 y.o. male presenting for evaluation of right hand pain and swelling.  Patient states about a week ago he was moving pallets", he accidentally impaled his right hand with a piece of wood.  He removed it, but since then has continued to have increasing pain and swelling of his right hand.  He does not know he got all the way out.  He denies numbness or tingling.  He has not been taking anything for pain.  He has not been cleaning it with anything.  He denies pain elsewhere.  His tetanus is up-to-date.   HPI     Past Medical History:  Diagnosis Date  . Diabetes mellitus type 2 in nonobese (HCC) 01/21/2016  . Gastric ulcer   . Hepatitis C   . Hyperlipidemia   . Hypertension   . Substance abuse [facility])     Patient Active Problem List   Diagnosis Date Noted  . Open finger dislocation 02/26/2017  . Diabetes mellitus type 2 in nonobese (HCC) 01/21/2016  . Fall from chair 05/15/2015  . Alcohol abuse 05/05/2015  . Pain, dental 03/27/2013  . HTN (hypertension) 04/25/2012  . Chronic hepatitis C virus infection (HCC) 11/15/2007  . ABDOMINAL PAIN-EPIGASTRIC 11/15/2007    Past Surgical History:  Procedure Laterality Date  . I & D EXTREMITY Right 02/26/2017   Procedure: IRRIGATION AND DEBRIDEMENT AND REDUCTION OF RIGHT MIDDLE FINGER;  Surgeon: Dominica Severin, MD;  Location: [facility] OR;  Service: Orthopedics;  Laterality: Right;  . STOMACH SURGERY         Family History  Problem Relation Age of Onset  . Diabetes Father   . Hypertension Father   . Diabetes Sister   . Hypertension Sister   . Diabetes Brother   . Hypertension Brother     Social History   Tobacco Use  . Smoking status: Current Some Day Smoker  . Smokeless tobacco: Never Used  Substance Use Topics  .  Alcohol use: Yes    Alcohol/week: 0.0 standard drinks    Comment: drank a quart of beer this morning PT DENIES 09/26/15  . Drug use: No    Types: Cocaine, Oxycodone    Comment: oxycodone 2 days ago "my sister gave me one"; no cocaine use    Home Medications Prior to Admission medications   Medication Sig Start Date End Date Taking? Authorizing Provider  cephALEXin (KEFLEX) 500 MG capsule Take 1 capsule (500 mg total) by mouth 4 (four) times daily for 5 days. 02/18/20 02/23/20  Erinn Huskins, PA-C  lisinopril (PRINIVIL,ZESTRIL) 5 MG tablet Take 1 tablet (5 mg total) by mouth daily. No more refills - must have OV 11/18/16   Quentin Angst, MD  naproxen (NAPROSYN) 250 MG tablet Take 1 tablet (250 mg total) by mouth 2 (two) times daily with a meal. 02/18/20   Belita Warsame, PA-C  sulfamethoxazole-trimethoprim (BACTRIM DS,SEPTRA DS) 800-160 MG tablet Take 1 tablet by mouth 2 (two) times daily. 02/27/17   Dominica Severin, MD    Allergies    Ibuprofen, Aspirin, Other, and Tylenol [acetaminophen]  Review of Systems   Review of Systems  Musculoskeletal: Positive for arthralgias.  Skin: Positive for wound.  Hematological: Does not bruise/bleed easily.    Physical Exam Updated Vital Signs BP 125/90 (  BP Location: Right Arm)   Pulse 76   Temp 97.8 F (36.6 C) (Oral)   Resp 20   Ht 5\' 9"  (1.753 m)   Wt 73.5 kg   SpO2 99%   BMI 23.92 kg/m   Physical Exam Vitals and nursing note reviewed.  Constitutional:      General: He is not in acute distress.    Appearance: He is well-developed.  HENT:     Head: Normocephalic and atraumatic.  Pulmonary:     Effort: Pulmonary effort is normal.  Abdominal:     General: There is no distension.  Musculoskeletal:        General: Swelling and tenderness present. Normal range of motion.     Cervical back: Normal range of motion.     Comments: Mild swelling of the right hand at the base of the right thumb.  Puncture wound present, but no  active drainage.  Generalized soreness with palpation of the palm of the right hand.  No pain to palpation of the knee, or index finger.  No numbness.  Full active range of motion of the wrist and all fingers.  Good cap refill.  Skin:    General: Skin is warm.     Findings: No rash.  Neurological:     Mental Status: He is alert and oriented to person, place, and time.     ED Results / Procedures / Treatments   Labs (all labs ordered are listed, but only abnormal results are displayed) Labs Reviewed - No data to display  EKG None  Radiology DG Hand Complete Right  Result Date: 02/18/2020 CLINICAL DATA:  Pain with concern for foreign body EXAM: RIGHT HAND - COMPLETE 3+ VIEW COMPARISON:  Right third finger February 26, 2017 FINDINGS: Frontal, oblique, and lateral views were obtained. There is no demonstrable radiopaque foreign body. No fracture or dislocation. There is slight spurring in the first IP joint and third DIP joint. Other joint spaces appear unremarkable. No appreciable joint space narrowing or erosion. 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 Other joint spaces appear unremarkable. No fracture or dislocation. No radiopaque foreign body evident. Electronically Signed   By: February 28, 2017 III M.D.   On: 02/18/2020 13:53    Procedures Procedures (including critical care time)  Medications Ordered in ED Medications - No data to display  ED Course  I have reviewed the triage vital signs and the nursing notes.  Pertinent labs & imaging results that were available during my care of the patient were reviewed by me and considered in my medical decision making (see chart for details).    MDM Rules/Calculators/A&P                          Patient presenting for evaluation of right hand pain after having a puncture wound to the wound.  On exam, patient appears nontoxic.  He is nervous intact.  He does have some mild swelling and tenderness,  as well as clinical signs of puncture wound.  Will obtain x-ray to ensure no gas, osteo, or obvious foreign body.  No palpable abscess, I believe the risk of attempting I&D or foreign body removal without known location of foreign body outweighs the benefit.  Tetanus is up-to-date.  X-ray viewed interpreted by me, no obvious bony abnormality.  No sign of osteo or foreign body.  Discussed findings with patient.  Discussed treatment with and antibiotics.  Patient to  follow-up with hand as needed, he has seen Dr. Amanda Pea in the past and thus will give Dr. Carlos Levering information.  Strict return precautions given, including signs of worsening infection.  At this time, patient appears safe discharge.  Return precautions given. Patient states he understands and agrees to plan.  Final Clinical Impression(s) / ED Diagnoses Final diagnoses:  Puncture wound of right hand with infection, initial encounter    Rx / DC Orders ED Discharge Orders         Ordered    cephALEXin (KEFLEX) 500 MG capsule  4 times daily        02/18/20 1415    naproxen (NAPROSYN) 250 MG tablet  2 times daily with meals        02/18/20 1415           Andilynn Delavega, PA-C 02/18/20 1429    Tegeler, Canary Brim, MD 02/18/20 1559

## 2020-02-18 NOTE — ED Notes (Signed)
Patient transported to X-ray 

## 2020-02-18 NOTE — ED Triage Notes (Signed)
Pt states over one week ago he picked up a wooden pallet and a large piece of wood was stuck in right palm, pt thought he pulled it out but he has been having pain and swelling in his right hand since this time that seems to be getting worse.

## 2020-02-18 NOTE — Discharge Instructions (Signed)
Take naproxen 2 times a day with meals.  Do not take other anti-inflammatories at the same time (Advil, Motrin, ibuprofen, Aleve). You may supplement with Tylenol if you need further pain control. Use ice packs or heating pads if this helps control your pain. Take Tylenol for). Course, even if your symptoms improve. Call me in doctor listed 1 to set up a follow-up appointment.  If your symptoms completely resolved prior to the appointment, you may always cancel. Return to the emergency room if you develop high fevers, severe worsening pain, pus draining from the area, numbness, difficulty moving or straightening out your fingers, or any new, worsening, or concerning symptoms

## 2020-10-14 ENCOUNTER — Other Ambulatory Visit: Payer: Self-pay

## 2020-10-14 ENCOUNTER — Encounter (HOSPITAL_COMMUNITY): Payer: Self-pay | Admitting: Emergency Medicine

## 2020-10-14 ENCOUNTER — Emergency Department (HOSPITAL_COMMUNITY): Payer: No Typology Code available for payment source

## 2020-10-14 ENCOUNTER — Emergency Department (HOSPITAL_COMMUNITY)
Admission: EM | Admit: 2020-10-14 | Discharge: 2020-10-14 | Disposition: A | Discharge status: Home / Self Care | Payer: No Typology Code available for payment source | Attending: Emergency Medicine | Admitting: Emergency Medicine

## 2020-10-14 DIAGNOSIS — I10 Essential (primary) hypertension: Secondary | ICD-10-CM | POA: Diagnosis not present

## 2020-10-14 DIAGNOSIS — F17200 Nicotine dependence, unspecified, uncomplicated: Secondary | ICD-10-CM | POA: Insufficient documentation

## 2020-10-14 DIAGNOSIS — Y92410 Unspecified street and highway as the place of occurrence of the external cause: Secondary | ICD-10-CM | POA: Diagnosis not present

## 2020-10-14 DIAGNOSIS — M549 Dorsalgia, unspecified: Secondary | ICD-10-CM | POA: Diagnosis present

## 2020-10-14 DIAGNOSIS — E119 Type 2 diabetes mellitus without complications: Secondary | ICD-10-CM | POA: Diagnosis not present

## 2020-10-14 DIAGNOSIS — M545 Low back pain, unspecified: Secondary | ICD-10-CM | POA: Insufficient documentation

## 2020-10-14 DIAGNOSIS — Z79899 Other long term (current) drug therapy: Secondary | ICD-10-CM | POA: Diagnosis not present

## 2020-10-14 DIAGNOSIS — S39012A Strain of muscle, fascia and tendon of lower back, initial encounter: Secondary | ICD-10-CM

## 2020-10-14 MED ORDER — CYCLOBENZAPRINE HCL 5 MG PO TABS: 5.0000 mg | ORAL_TABLET | Freq: Three times a day (TID) | ORAL | 0 refills | Status: DC | PRN

## 2020-10-14 MED ORDER — CYCLOBENZAPRINE HCL 10 MG PO TABS
5.0000 mg | ORAL_TABLET | Freq: Once | ORAL | Status: AC
  Administered 2020-10-14: 5 mg via ORAL
  Filled 2020-10-14: qty 1
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
## 2020-10-14 NOTE — ED Notes (Signed)
Patient verbalizes understanding of discharge instructions. Prescriptions reviewed. Opportunity for questioning and answers were provided. Armband removed by staff, pt discharged from ED ambulatory. ° °

## 2020-10-14 NOTE — Discharge Instructions (Signed)
You have strained your back and your x-rays did not show any fracture.  Please rest tomorrow.  Take Flexeril as needed for muscle spasms.  See your doctor for follow up  Return to ER if you have worse back pain, numbness, weakness, trouble walking.

## 2021-01-03 ENCOUNTER — Emergency Department (HOSPITAL_COMMUNITY): Payer: Self-pay

## 2021-01-03 ENCOUNTER — Inpatient Hospital Stay (HOSPITAL_COMMUNITY)
Admission: EM | Admit: 2021-01-03 | Discharge: 2021-01-06 | DRG: 638 | Disposition: A | Discharge status: Home Health | Payer: Self-pay | Attending: Internal Medicine | Admitting: Internal Medicine

## 2021-01-03 ENCOUNTER — Other Ambulatory Visit: Payer: Self-pay

## 2021-01-03 DIAGNOSIS — Z8249 Family history of ischemic heart disease and other diseases of the circulatory system: Secondary | ICD-10-CM

## 2021-01-03 DIAGNOSIS — R7989 Other specified abnormal findings of blood chemistry: Secondary | ICD-10-CM

## 2021-01-03 DIAGNOSIS — Z9114 Patient's other noncompliance with medication regimen: Secondary | ICD-10-CM

## 2021-01-03 DIAGNOSIS — E871 Hypo-osmolality and hyponatremia: Secondary | ICD-10-CM | POA: Diagnosis present

## 2021-01-03 DIAGNOSIS — E785 Hyperlipidemia, unspecified: Secondary | ICD-10-CM | POA: Diagnosis present

## 2021-01-03 DIAGNOSIS — Z79899 Other long term (current) drug therapy: Secondary | ICD-10-CM

## 2021-01-03 DIAGNOSIS — R251 Tremor, unspecified: Secondary | ICD-10-CM | POA: Diagnosis present

## 2021-01-03 DIAGNOSIS — Z888 Allergy status to other drugs, medicaments and biological substances status: Secondary | ICD-10-CM

## 2021-01-03 DIAGNOSIS — E1142 Type 2 diabetes mellitus with diabetic polyneuropathy: Secondary | ICD-10-CM | POA: Diagnosis present

## 2021-01-03 DIAGNOSIS — E440 Moderate protein-calorie malnutrition: Secondary | ICD-10-CM | POA: Diagnosis present

## 2021-01-03 DIAGNOSIS — E1110 Type 2 diabetes mellitus with ketoacidosis without coma: Principal | ICD-10-CM | POA: Diagnosis present

## 2021-01-03 DIAGNOSIS — K760 Fatty (change of) liver, not elsewhere classified: Secondary | ICD-10-CM | POA: Diagnosis present

## 2021-01-03 DIAGNOSIS — Z9119 Patient's noncompliance with other medical treatment and regimen: Secondary | ICD-10-CM

## 2021-01-03 DIAGNOSIS — F17200 Nicotine dependence, unspecified, uncomplicated: Secondary | ICD-10-CM | POA: Diagnosis present

## 2021-01-03 DIAGNOSIS — Z886 Allergy status to analgesic agent status: Secondary | ICD-10-CM

## 2021-01-03 DIAGNOSIS — Z8711 Personal history of peptic ulcer disease: Secondary | ICD-10-CM

## 2021-01-03 DIAGNOSIS — B182 Chronic viral hepatitis C: Secondary | ICD-10-CM | POA: Diagnosis present

## 2021-01-03 DIAGNOSIS — F1010 Alcohol abuse, uncomplicated: Secondary | ICD-10-CM | POA: Diagnosis present

## 2021-01-03 DIAGNOSIS — Z6823 Body mass index (BMI) 23.0-23.9, adult: Secondary | ICD-10-CM

## 2021-01-03 DIAGNOSIS — Z833 Family history of diabetes mellitus: Secondary | ICD-10-CM

## 2021-01-03 DIAGNOSIS — Z597 Insufficient social insurance and welfare support: Secondary | ICD-10-CM

## 2021-01-03 DIAGNOSIS — I10 Essential (primary) hypertension: Secondary | ICD-10-CM | POA: Diagnosis present

## 2021-01-03 DIAGNOSIS — Z20822 Contact with and (suspected) exposure to covid-19: Secondary | ICD-10-CM | POA: Diagnosis present

## 2021-01-03 LAB — URINALYSIS, ROUTINE W REFLEX MICROSCOPIC
Bacteria, UA: NONE SEEN
Bilirubin Urine: NEGATIVE
Glucose, UA: >=500 mg/dL — AB
Ketones, ur: 20 mg/dL — AB
Leukocytes,Ua: NEGATIVE
Nitrite: NEGATIVE
Protein, ur: 100 mg/dL — AB
Specific Gravity, Urine: 1.028 (ref 1.005–1.030)
pH: 5 (ref 5.0–8.0)

## 2021-01-03 LAB — CBC WITH DIFFERENTIAL/PLATELET
Abs Immature Granulocytes: 0.03 10*3/uL (ref 0.00–0.07)
Basophils Absolute: 0 10*3/uL (ref 0.0–0.1)
Basophils Relative: 0 %
Eosinophils Absolute: 0 10*3/uL (ref 0.0–0.5)
Eosinophils Relative: 0 %
HCT: 47.6 % (ref 39.0–52.0)
Hemoglobin: 16.5 g/dL (ref 13.0–17.0)
Immature Granulocytes: 0 %
Lymphocytes Relative: 11 %
Lymphs Abs: 1 10*3/uL (ref 0.7–4.0)
MCH: 32.5 pg (ref 26.0–34.0)
MCHC: 34.7 g/dL (ref 30.0–36.0)
MCV: 93.9 fL (ref 80.0–100.0)
Monocytes Absolute: 0.5 10*3/uL (ref 0.1–1.0)
Monocytes Relative: 6 %
Neutro Abs: 7 10*3/uL (ref 1.7–7.7)
Neutrophils Relative %: 83 %
Platelets: 202 10*3/uL (ref 150–400)
RBC: 5.07 MIL/uL (ref 4.22–5.81)
RDW: 11.1 % — ABNORMAL LOW (ref 11.5–15.5)
WBC: 8.6 10*3/uL (ref 4.0–10.5)
nRBC: 0 % (ref 0.0–0.2)

## 2021-01-03 LAB — COMPREHENSIVE METABOLIC PANEL
ALT: 219 U/L — ABNORMAL HIGH (ref 0–44)
AST: 349 U/L — ABNORMAL HIGH (ref 15–41)
Albumin: 4.2 g/dL (ref 3.5–5.0)
Alkaline Phosphatase: 84 U/L (ref 38–126)
Anion gap: 22 — ABNORMAL HIGH (ref 5–15)
BUN: 12 mg/dL (ref 6–20)
CO2: 17 mmol/L — ABNORMAL LOW (ref 22–32)
Calcium: 9.5 mg/dL (ref 8.9–10.3)
Chloride: 90 mmol/L — ABNORMAL LOW (ref 98–111)
Creatinine, Ser: 1.15 mg/dL (ref 0.61–1.24)
GFR, Estimated: >60 mL/min (ref >60)
Glucose, Bld: 340 mg/dL — ABNORMAL HIGH (ref 70–99)
Potassium: 5.4 mmol/L — ABNORMAL HIGH (ref 3.5–5.1)
Sodium: 129 mmol/L — ABNORMAL LOW (ref 135–145)
Total Bilirubin: 1.3 mg/dL — ABNORMAL HIGH (ref 0.3–1.2)
Total Protein: 9 g/dL — ABNORMAL HIGH (ref 6.5–8.1)

## 2021-01-03 LAB — I-STAT VENOUS BLOOD GAS, ED
Acid-base deficit: 5 mmol/L — ABNORMAL HIGH (ref 0.0–2.0)
Bicarbonate: 19.9 mmol/L — ABNORMAL LOW (ref 20.0–28.0)
Calcium, Ion: 1.02 mmol/L — ABNORMAL LOW (ref 1.15–1.40)
HCT: 52 % (ref 39.0–52.0)
Hemoglobin: 17.7 g/dL — ABNORMAL HIGH (ref 13.0–17.0)
O2 Saturation: 75 %
Potassium: 5.4 mmol/L — ABNORMAL HIGH (ref 3.5–5.1)
Sodium: 128 mmol/L — ABNORMAL LOW (ref 135–145)
TCO2: 21 mmol/L — ABNORMAL LOW (ref 22–32)
pCO2, Ven: 36.8 mmHg — ABNORMAL LOW (ref 44.0–60.0)
pH, Ven: 7.34 (ref 7.250–7.430)
pO2, Ven: 42 mmHg (ref 32.0–45.0)

## 2021-01-03 NOTE — ED Provider Notes (Signed)
Emergency Medicine Provider Triage Evaluation Note  Chad Wolfe , a 55 y.o. male  was evaluated in triage.  Pt complains of lateral feet numbness x1 month.  Patient states a family history of diabetes, has never been tested himself.  He had a glucose of 375 for EMS.  Patient states he has no feeling in both of his feet, denies any falls or injury.  Patient mitts to drinking beer daily, had a 40 ounce beer earlier today.  Admits to polyuria and polydipsia as well.  Review of Systems  Positive: Arthralgias, polyuria, polydipsia, numbness Negative: Fever, chills  Physical Exam  Ht 5\' 7"  (1.702 m)   Wt 68 kg   BMI 23.49 kg/m  Gen:   Awake, no distress   Resp:  Normal effort  MSK:   Moves extremities without difficulty  Other:  Calluses and skin breakdown on distal aspect of toes on right foot.  DP pulse 2+ bilaterally.  Medical Decision Making  Medically screening exam initiated at 6:24 PM.  Appropriate orders placed.  was informed that the remainder of the evaluation will be completed by another provider, this initial triage assessment does not replace that evaluation, and the importance of remaining in the ED until their evaluation is complete.  Basic labs VBG ordered.  X-ray of left foot ordered.   Portions of this note were generated with Lynnea Ferrier. Dictation errors may occur despite best attempts at proofreading.    Scientist, clinical (histocompatibility and immunogenetics), PA-C 01/03/21 1830    01/05/21, MD 01/04/21 [phone]
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
## 2021-01-04 ENCOUNTER — Observation Stay (HOSPITAL_COMMUNITY): Payer: Self-pay

## 2021-01-04 DIAGNOSIS — E1110 Type 2 diabetes mellitus with ketoacidosis without coma: Secondary | ICD-10-CM | POA: Diagnosis present

## 2021-01-04 LAB — CBG MONITORING, ED
Glucose-Capillary: 290 mg/dL — ABNORMAL HIGH (ref 70–99)
Glucose-Capillary: 286 mg/dL — ABNORMAL HIGH (ref 70–99)
Glucose-Capillary: 213 mg/dL — ABNORMAL HIGH (ref 70–99)
Glucose-Capillary: 203 mg/dL — ABNORMAL HIGH (ref 70–99)
Glucose-Capillary: 192 mg/dL — ABNORMAL HIGH (ref 70–99)
Glucose-Capillary: 210 mg/dL — ABNORMAL HIGH (ref 70–99)
Glucose-Capillary: 190 mg/dL — ABNORMAL HIGH (ref 70–99)
Glucose-Capillary: 136 mg/dL — ABNORMAL HIGH (ref 70–99)
Glucose-Capillary: 184 mg/dL — ABNORMAL HIGH (ref 70–99)
Glucose-Capillary: 158 mg/dL — ABNORMAL HIGH (ref 70–99)
Glucose-Capillary: 187 mg/dL — ABNORMAL HIGH (ref 70–99)

## 2021-01-04 LAB — CBC
HCT: 45.1 % (ref 39.0–52.0)
Hemoglobin: 15.8 g/dL (ref 13.0–17.0)
MCH: 33.3 pg (ref 26.0–34.0)
MCHC: 35 g/dL (ref 30.0–36.0)
MCV: 94.9 fL (ref 80.0–100.0)
Platelets: 173 10*3/uL (ref 150–400)
RBC: 4.75 MIL/uL (ref 4.22–5.81)
RDW: 11.2 % — ABNORMAL LOW (ref 11.5–15.5)
WBC: 7.4 10*3/uL (ref 4.0–10.5)
nRBC: 0 % (ref 0.0–0.2)

## 2021-01-04 LAB — RESP PANEL BY RT-PCR (FLU A&B, COVID) ARPGX2
Influenza A by PCR: NEGATIVE
Influenza B by PCR: NEGATIVE
SARS Coronavirus 2 by RT PCR: NEGATIVE

## 2021-01-04 LAB — BASIC METABOLIC PANEL
Anion gap: 13 (ref 5–15)
BUN: 14 mg/dL (ref 6–20)
CO2: 21 mmol/L — ABNORMAL LOW (ref 22–32)
Calcium: 8.9 mg/dL (ref 8.9–10.3)
Chloride: 97 mmol/L — ABNORMAL LOW (ref 98–111)
Creatinine, Ser: 0.92 mg/dL (ref 0.61–1.24)
GFR, Estimated: >60 mL/min (ref >60)
Glucose, Bld: 147 mg/dL — ABNORMAL HIGH (ref 70–99)
Potassium: 4.2 mmol/L (ref 3.5–5.1)
Sodium: 131 mmol/L — ABNORMAL LOW (ref 135–145)

## 2021-01-04 LAB — COMPREHENSIVE METABOLIC PANEL
ALT: 179 U/L — ABNORMAL HIGH (ref 0–44)
AST: 209 U/L — ABNORMAL HIGH (ref 15–41)
Albumin: 3.6 g/dL (ref 3.5–5.0)
Alkaline Phosphatase: 67 U/L (ref 38–126)
Anion gap: 19 — ABNORMAL HIGH (ref 5–15)
BUN: 17 mg/dL (ref 6–20)
CO2: 18 mmol/L — ABNORMAL LOW (ref 22–32)
Calcium: 9.4 mg/dL (ref 8.9–10.3)
Chloride: 96 mmol/L — ABNORMAL LOW (ref 98–111)
Creatinine, Ser: 1.08 mg/dL (ref 0.61–1.24)
GFR, Estimated: >60 mL/min (ref >60)
Glucose, Bld: 199 mg/dL — ABNORMAL HIGH (ref 70–99)
Potassium: 5.7 mmol/L — ABNORMAL HIGH (ref 3.5–5.1)
Sodium: 133 mmol/L — ABNORMAL LOW (ref 135–145)
Total Bilirubin: 1.9 mg/dL — ABNORMAL HIGH (ref 0.3–1.2)
Total Protein: 8.1 g/dL (ref 6.5–8.1)

## 2021-01-04 LAB — PHOSPHORUS: Phosphorus: 4.7 mg/dL — ABNORMAL HIGH (ref 2.5–4.6)

## 2021-01-04 LAB — ETHANOL: Alcohol, Ethyl (B): 102 mg/dL — ABNORMAL HIGH (ref <10)

## 2021-01-04 LAB — HEMOGLOBIN A1C
Hgb A1c MFr Bld: 11.5 % — ABNORMAL HIGH (ref 4.8–5.6)
Mean Plasma Glucose: 283.35 mg/dL

## 2021-01-04 LAB — GLUCOSE, CAPILLARY
Glucose-Capillary: 218 mg/dL — ABNORMAL HIGH (ref 70–99)
Glucose-Capillary: 283 mg/dL — ABNORMAL HIGH (ref 70–99)

## 2021-01-04 LAB — LIPASE, BLOOD: Lipase: 40 U/L (ref 11–51)

## 2021-01-04 LAB — HIV ANTIBODY (ROUTINE TESTING W REFLEX): HIV Screen 4th Generation wRfx: NONREACTIVE

## 2021-01-04 LAB — BETA-HYDROXYBUTYRIC ACID
Beta-Hydroxybutyric Acid: 5.67 mmol/L — ABNORMAL HIGH (ref 0.05–0.27)
Beta-Hydroxybutyric Acid: 3.36 mmol/L — ABNORMAL HIGH (ref 0.05–0.27)

## 2021-01-04 LAB — MAGNESIUM: Magnesium: 2.3 mg/dL (ref 1.7–2.4)

## 2021-01-04 MED ORDER — LACTATED RINGERS IV BOLUS: 20.0000 mL/kg | Freq: Once | INTRAVENOUS | Status: DC

## 2021-01-04 MED ORDER — INSULIN GLARGINE-YFGN 100 UNIT/ML ~~LOC~~ SOLN
20.0000 [IU] | Freq: Every day | SUBCUTANEOUS | Status: DC
  Administered 2021-01-04 – 2021-01-06 (×3): 20 [IU] via SUBCUTANEOUS
  Filled 2021-01-04 (×3): qty 0.2

## 2021-01-04 MED ORDER — OXYCODONE HCL 5 MG PO TABS: 5.0000 mg | ORAL_TABLET | Freq: Four times a day (QID) | ORAL | Status: DC | PRN

## 2021-01-04 MED ORDER — THIAMINE HCL 100 MG PO TABS
100.0000 mg | ORAL_TABLET | Freq: Every day | ORAL | Status: DC
  Administered 2021-01-04 – 2021-01-06 (×3): 100 mg via ORAL
  Filled 2021-01-04 (×3): qty 1

## 2021-01-04 MED ORDER — ADULT MULTIVITAMIN W/MINERALS CH
1.0000 | ORAL_TABLET | Freq: Every day | ORAL | Status: DC
  Administered 2021-01-04 – 2021-01-06 (×3): 1 via ORAL
  Filled 2021-01-04 (×3): qty 1

## 2021-01-04 MED ORDER — INSULIN ASPART 100 UNIT/ML IJ SOLN
0.0000 [IU] | Freq: Three times a day (TID) | INTRAMUSCULAR | Status: DC
  Administered 2021-01-04: 2 [IU] via SUBCUTANEOUS
  Administered 2021-01-05 (×2): 3 [IU] via SUBCUTANEOUS
  Administered 2021-01-06: 5 [IU] via SUBCUTANEOUS
  Administered 2021-01-06: 2 [IU] via SUBCUTANEOUS

## 2021-01-04 MED ORDER — DIAZEPAM 5 MG PO TABS
5.0000 mg | ORAL_TABLET | Freq: Two times a day (BID) | ORAL | Status: DC
  Administered 2021-01-05 – 2021-01-06 (×3): 5 mg via ORAL
  Filled 2021-01-04 (×3): qty 1

## 2021-01-04 MED ORDER — DIAZEPAM 5 MG PO TABS
10.0000 mg | ORAL_TABLET | Freq: Three times a day (TID) | ORAL | Status: AC
  Administered 2021-01-04 (×3): 10 mg via ORAL
  Filled 2021-01-04 (×3): qty 2

## 2021-01-04 MED ORDER — LISINOPRIL 5 MG PO TABS
5.0000 mg | ORAL_TABLET | Freq: Every day | ORAL | Status: DC
  Administered 2021-01-04 – 2021-01-06 (×3): 5 mg via ORAL
  Filled 2021-01-04 (×3): qty 1

## 2021-01-04 MED ORDER — INSULIN ASPART 100 UNIT/ML IJ SOLN
0.0000 [IU] | Freq: Every day | INTRAMUSCULAR | Status: DC
  Administered 2021-01-04: 2 [IU] via SUBCUTANEOUS

## 2021-01-04 MED ORDER — ENOXAPARIN SODIUM 40 MG/0.4ML IJ SOSY
40.0000 mg | PREFILLED_SYRINGE | Freq: Every day | INTRAMUSCULAR | Status: DC
  Administered 2021-01-04 – 2021-01-06 (×3): 40 mg via SUBCUTANEOUS
  Filled 2021-01-04 (×3): qty 0.4

## 2021-01-04 MED ORDER — LORAZEPAM 1 MG PO TABS: 1.0000 mg | ORAL_TABLET | ORAL | Status: DC | PRN

## 2021-01-04 MED ORDER — ONDANSETRON HCL 4 MG/2ML IJ SOLN
4.0000 mg | Freq: Four times a day (QID) | INTRAMUSCULAR | Status: DC | PRN
  Administered 2021-01-05: 4 mg via INTRAVENOUS
  Filled 2021-01-04: qty 2

## 2021-01-04 MED ORDER — TRAZODONE HCL 50 MG PO TABS
50.0000 mg | ORAL_TABLET | Freq: Every evening | ORAL | Status: DC | PRN
  Administered 2021-01-05: 50 mg via ORAL
  Filled 2021-01-04: qty 1

## 2021-01-04 MED ORDER — LORAZEPAM 2 MG/ML IJ SOLN
1.0000 mg | INTRAMUSCULAR | Status: DC | PRN
  Administered 2021-01-04 (×2): 2 mg via INTRAVENOUS
  Filled 2021-01-04 (×2): qty 1

## 2021-01-04 MED ORDER — GABAPENTIN 100 MG PO CAPS
100.0000 mg | ORAL_CAPSULE | Freq: Two times a day (BID) | ORAL | Status: DC
  Administered 2021-01-04 – 2021-01-05 (×3): 100 mg via ORAL
  Filled 2021-01-04 (×4): qty 1

## 2021-01-04 MED ORDER — DIAZEPAM 5 MG PO TABS: 10.0000 mg | ORAL_TABLET | Freq: Three times a day (TID) | ORAL | Status: DC

## 2021-01-04 MED ORDER — LACTATED RINGERS IV SOLN: INTRAVENOUS | Status: DC

## 2021-01-04 MED ORDER — THIAMINE HCL 100 MG/ML IJ SOLN
100.0000 mg | Freq: Every day | INTRAMUSCULAR | Status: DC
  Filled 2021-01-04: qty 2

## 2021-01-04 MED ORDER — MORPHINE SULFATE (PF) 2 MG/ML IV SOLN: 2.0000 mg | INTRAVENOUS | Status: DC | PRN

## 2021-01-04 MED ORDER — INSULIN REGULAR(HUMAN) IN NACL 100-0.9 UT/100ML-% IV SOLN
INTRAVENOUS | Status: DC
  Administered 2021-01-04: 4.6 [IU]/h via INTRAVENOUS
  Filled 2021-01-04: qty 100

## 2021-01-04 MED ORDER — DEXTROSE IN LACTATED RINGERS 5 % IV SOLN: INTRAVENOUS | Status: DC

## 2021-01-04 MED ORDER — DEXTROSE 50 % IV SOLN
0.0000 mL | INTRAVENOUS | Status: DC | PRN
  Filled 2021-01-04: qty 50

## 2021-01-04 MED ORDER — POLYETHYLENE GLYCOL 3350 17 G PO PACK: 17.0000 g | PACK | Freq: Every day | ORAL | Status: DC | PRN

## 2021-01-04 MED ORDER — LACTATED RINGERS IV BOLUS
20.0000 mL/kg | Freq: Once | INTRAVENOUS | Status: AC
  Administered 2021-01-04: 1360 mL via INTRAVENOUS

## 2021-01-04 MED ORDER — FOLIC ACID 1 MG PO TABS
1.0000 mg | ORAL_TABLET | Freq: Every day | ORAL | Status: DC
  Administered 2021-01-04 – 2021-01-06 (×3): 1 mg via ORAL
  Filled 2021-01-04 (×3): qty 1

## 2021-01-04 NOTE — Progress Notes (Addendum)
Pt arrived from ED, Telebox [id number], VSS, oriented to unit,, CHG complete, CIWA protocol started, call light within reach, will continue to monitor.  Kalman Jewels, RN 01/04/2021 6:47 PM
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
## 2021-01-04 NOTE — ED Provider Notes (Signed)
[facility] EMERGENCY DEPARTMENT Provider Note   CSN: [id number] Arrival date & time: 01/03/21  1804     History Chief Complaint  Patient presents with   feet numbness    Chad Wolfe is a 55 y.o. male.  The history is provided by the patient and medical records.   55 y.o. M with hx of DM2 but has never been on medications, Hep C, HLP, HTN, substance abuse, presenting to the ED with concern for his diabetes.  Reports frequent thirst and urination for the past several days along with numbness to bilateral soles of his feet.  He denies any wounds or blisters of the feet.  He has not had any fevers.  He does admit to longtime alcohol use.  He drinks daily once he gets home from work, often to the point where he passes out.  Mostly drinks beer and wine, unable to quantify exactly how much.  States both his parents have diabetes and he was told at one point that he was diabetic but was never started on medications.  He does admit to some recent weight loss.  Past Medical History:  Diagnosis Date   Diabetes mellitus type 2 in nonobese (HCC) 01/21/2016   Gastric ulcer    Hepatitis C    Hyperlipidemia    Hypertension    Substance abuse [facility])     Patient Active Problem List   Diagnosis Date Noted   Open finger dislocation 02/26/2017   Diabetes mellitus type 2 in nonobese (HCC) 01/21/2016   Fall from chair 05/15/2015   Alcohol abuse 05/05/2015   Pain, dental 03/27/2013   HTN (hypertension) 04/25/2012   Chronic hepatitis C virus infection (HCC) 11/15/2007   ABDOMINAL PAIN-EPIGASTRIC 11/15/2007    Past Surgical History:  Procedure Laterality Date   I & D EXTREMITY Right 02/26/2017   Procedure: IRRIGATION AND DEBRIDEMENT AND REDUCTION OF RIGHT MIDDLE FINGER;  Surgeon: Dominica Severin, MD;  Location: [facility] OR;  Service: Orthopedics;  Laterality: Right;   STOMACH SURGERY         Family History  Problem Relation Age of Onset   Diabetes Father    Hypertension  Father    Diabetes Sister    Hypertension Sister    Diabetes Brother    Hypertension Brother     Social History   Tobacco Use   Smoking status: Some Days   Smokeless tobacco: Never  Substance Use Topics   Alcohol use: Yes    Alcohol/week: 0.0 standard drinks    Comment: drank a quart of beer this morning PT DENIES 09/26/15   Drug use: No    Types: Cocaine, Oxycodone    Comment: oxycodone 2 days ago "my sister gave me one"; no cocaine use    Home Medications Prior to Admission medications   Medication Sig Start Date End Date Taking? Authorizing Provider  cyclobenzaprine (FLEXERIL) 5 MG tablet Take 1 tablet (5 mg total) by mouth 3 (three) times daily as needed. 10/14/20   Charlynne Pander, MD  lisinopril (PRINIVIL,ZESTRIL) 5 MG tablet Take 1 tablet (5 mg total) by mouth daily. No more refills - must have OV 11/18/16   Quentin Angst, MD  naproxen (NAPROSYN) 250 MG tablet Take 1 tablet (250 mg total) by mouth 2 (two) times daily with a meal. 02/18/20   Caccavale, Sophia, PA-C  sulfamethoxazole-trimethoprim (BACTRIM DS,SEPTRA DS) 800-160 MG tablet Take 1 tablet by mouth 2 (two) times daily. 02/27/17   Dominica Severin, MD  Allergies    Ibuprofen, Aspirin, Other, and Tylenol [acetaminophen]  Review of Systems   Review of Systems  Endocrine: Positive for polydipsia and polyuria.  All other systems reviewed and are negative.  Physical Exam Updated Vital Signs BP (!) 142/116 (BP Location: Right Arm)   Pulse (!) 112   Temp 98.4 F (36.9 C) (Oral)   Resp 18   Ht 5\' 7"  (1.702 m)   Wt 68 kg   SpO2 95%   BMI 23.49 kg/m   Physical Exam Vitals and nursing note reviewed.  Constitutional:      Appearance: He is well-developed.     Comments: Thin, but not cachectic  HENT:     Head: Normocephalic and atraumatic.  Eyes:     Conjunctiva/sclera: Conjunctivae normal.     Pupils: Pupils are equal, round, and reactive to light.  Cardiovascular:     Rate and Rhythm: Normal  rate and regular rhythm.     Heart sounds: Normal heart sounds.  Pulmonary:     Effort: Pulmonary effort is normal.     Breath sounds: Normal breath sounds.  Abdominal:     General: Bowel sounds are normal.     Palpations: Abdomen is soft.  Musculoskeletal:        General: Normal range of motion.     Cervical back: Normal range of motion.     Comments: No blisters, ulcerations or skin breakdown noted to the feet  Skin:    General: Skin is warm and dry.  Neurological:     Mental Status: He is alert and oriented to person, place, and time.    ED Results / Procedures / Treatments   Labs (all labs ordered are listed, but only abnormal results are displayed) Labs Reviewed  COMPREHENSIVE METABOLIC PANEL - Abnormal; Notable for the following components:      Result Value   Sodium 129 (*)    Potassium 5.4 (*)    Chloride 90 (*)    CO2 17 (*)    Glucose, Bld 340 (*)    Total Protein 9.0 (*)    AST 349 (*)    ALT 219 (*)    Total Bilirubin 1.3 (*)    Anion gap 22 (*)    All other components within normal limits  CBC WITH DIFFERENTIAL/PLATELET - Abnormal; Notable for the following components:   RDW 11.1 (*)    All other components within normal limits  URINALYSIS, ROUTINE W REFLEX MICROSCOPIC - Abnormal; Notable for the following components:   Glucose, UA >=500 (*)    Hgb urine dipstick MODERATE (*)    Ketones, ur 20 (*)    Protein, ur 100 (*)    All other components within normal limits  HEMOGLOBIN A1C - Abnormal; Notable for the following components:   Hgb A1c MFr Bld 11.5 (*)    All other components within normal limits  I-STAT VENOUS BLOOD GAS, ED - Abnormal; Notable for the following components:   pCO2, Ven 36.8 (*)    Bicarbonate 19.9 (*)    TCO2 21 (*)    Acid-base deficit 5.0 (*)    Sodium 128 (*)    Potassium 5.4 (*)    Calcium, Ion 1.02 (*)    Hemoglobin 17.7 (*)    All other components within normal limits  CBG MONITORING, ED - Abnormal; Notable for the  following components:   Glucose-Capillary 290 (*)    All other components within normal limits  CBG MONITORING, ED - Abnormal; Notable for  the following components:   Glucose-Capillary 286 (*)    All other components within normal limits  RESP PANEL BY RT-PCR (FLU A&B, COVID) ARPGX2  ETHANOL  BETA-HYDROXYBUTYRIC ACID  BETA-HYDROXYBUTYRIC ACID  BETA-HYDROXYBUTYRIC ACID    EKG None  Radiology DG Foot Complete Left  Result Date: 01/03/2021 CLINICAL DATA:  Foot numbness and pain for a month. EXAM: LEFT FOOT - COMPLETE 3+ VIEW COMPARISON:  None. FINDINGS: Degenerative changes at the first MTP joint. No fractures are identified. Enthesopathic changes along the posterior calcaneus at the Achilles insertion site. No other abnormalities. IMPRESSION: No cause for foot pain identified. Electronically Signed   By: Gerome Sam III M.D   On: 01/03/2021 20:01    Procedures Procedures   CRITICAL CARE Performed by: Garlon Hatchet   Total critical care time: 45 minutes  Critical care time was exclusive of separately billable procedures and treating other patients.  Critical care was necessary to treat or prevent imminent or life-threatening deterioration.  Critical care was time spent personally by me on the following activities: development of treatment plan with patient and/or surrogate as well as nursing, discussions with consultants, evaluation of patient's response to treatment, examination of patient, obtaining history from patient or surrogate, ordering and performing treatments and interventions, ordering and review of laboratory studies, ordering and review of radiographic studies, pulse oximetry and re-evaluation of patient's condition.   Medications Ordered in ED Medications  lactated ringers bolus 1,360 mL (has no administration in time range)  insulin regular, human (MYXREDLIN) 100 units/ 100 mL infusion (has no administration in time range)  lactated ringers infusion (has no  administration in time range)  dextrose 5 % in lactated ringers infusion (has no administration in time range)  dextrose 50 % solution 0-50 mL (has no administration in time range)    ED Course  I have reviewed the triage vital signs and the nursing notes.  Pertinent labs & imaging results that were available during my care of the patient were reviewed by me and considered in my medical decision making (see chart for details).    MDM Rules/Calculators/A&P                           55 year old male here with polyuria and polydipsia along with numbness to bilateral feet.  History of diabetes reports he has never been on medications in the past.  He is afebrile, nontoxic.  Unfortunately, prolonged wait in the lobby of 9 hours.  Upon arrival to treatment area he is still tachycardic, urinating frequently.  His labs are concerning for DKA with bicarb of 17, gap of 22.  Hemoglobin A1C is 11.5.  Does have ketones in the urine.  His LFTs are also elevated, suspect this is a combination of his alcohol abuse and known hepatitis C.  Will start on IV fluid bolus, insulin drip.  He will require admission.  Discussed with Dr. Margo Aye-- will admit for ongoing care.  Final Clinical Impression(s) / ED Diagnoses Final diagnoses:  Diabetic ketoacidosis without coma associated with type 2 diabetes mellitus (HCC)  Elevated LFTs    Rx / DC Orders ED Discharge Orders     None        Garlon Hatchet, PA-C 01/04/21 0425    Gilda Crease, MD 01/04/21 [phone]

## 2021-01-04 NOTE — Progress Notes (Signed)
Inpatient Diabetes Program Recommendations  AACE/ADA: New Consensus Statement on Inpatient Glycemic Control (2015)  Target Ranges:  Prepandial:   less than 140 mg/dL      Peak postprandial:   less than 180 mg/dL (1-2 hours)      Critically ill patients:  140 - 180 mg/dL   Lab Results  Component Value Date   GLUCAP 210 (H) 01/04/2021   HGBA1C 11.5 (H) 01/04/2021    Review of Glycemic Control Results for BERLIN, MOKRY (MRN [id number]) as of 01/04/2021 09:00  Ref. Range 01/04/2021 06:03 01/04/2021 07:01 01/04/2021 08:32  Glucose-Capillary Latest Ref Range: 70 - 99 mg/dL 341 (H) 962 (H) 229 (H)   Diabetes history: DM 2 Outpatient Diabetes medications: has not been on meds for DM for several years Current orders for Inpatient glycemic control:  IV insulin/DKA order set  Inpatient Diabetes Program Recommendations:    Patient not currently ready for transition.  It appears that he will need insulin at d/c.  Based on weight, consider Glargine yfgn (Semglee)- 20 units 2 hours prior to d/c of insulin drip (once acidosis is cleared).  Also consider Novolog sensitive correction tid with meals and HS as well as Novolog 3 units tid with meals (hold if patient eats less than 50% or NPO).  Do not recommend restart of Metformin due to hx. Of ETOH use. Diabetes coordinator will see patient on 01/05/21.    Thanks,  Beryl Meager, RN, BC-ADM Inpatient Diabetes Coordinator Pager [phone]  (8a-5p)

## 2021-01-04 NOTE — ED Notes (Signed)
Pt states he needs his sugar checked because he knows it is high

## 2021-01-05 ENCOUNTER — Encounter (HOSPITAL_COMMUNITY): Payer: Self-pay | Admitting: Internal Medicine

## 2021-01-05 DIAGNOSIS — E1110 Type 2 diabetes mellitus with ketoacidosis without coma: Secondary | ICD-10-CM | POA: Diagnosis present

## 2021-01-05 LAB — GLUCOSE, CAPILLARY
Glucose-Capillary: 166 mg/dL — ABNORMAL HIGH (ref 70–99)
Glucose-Capillary: 188 mg/dL — ABNORMAL HIGH (ref 70–99)
Glucose-Capillary: 113 mg/dL — ABNORMAL HIGH (ref 70–99)
Glucose-Capillary: 163 mg/dL — ABNORMAL HIGH (ref 70–99)

## 2021-01-05 LAB — COMPREHENSIVE METABOLIC PANEL
ALT: 132 U/L — ABNORMAL HIGH (ref 0–44)
AST: 140 U/L — ABNORMAL HIGH (ref 15–41)
Albumin: 3 g/dL — ABNORMAL LOW (ref 3.5–5.0)
Alkaline Phosphatase: 52 U/L (ref 38–126)
Anion gap: 13 (ref 5–15)
BUN: 15 mg/dL (ref 6–20)
CO2: 21 mmol/L — ABNORMAL LOW (ref 22–32)
Calcium: 9 mg/dL (ref 8.9–10.3)
Chloride: 95 mmol/L — ABNORMAL LOW (ref 98–111)
Creatinine, Ser: 1.05 mg/dL (ref 0.61–1.24)
GFR, Estimated: >60 mL/min (ref >60)
Glucose, Bld: 225 mg/dL — ABNORMAL HIGH (ref 70–99)
Potassium: 4.5 mmol/L (ref 3.5–5.1)
Sodium: 129 mmol/L — ABNORMAL LOW (ref 135–145)
Total Bilirubin: 2.3 mg/dL — ABNORMAL HIGH (ref 0.3–1.2)
Total Protein: 6.8 g/dL (ref 6.5–8.1)

## 2021-01-05 LAB — PHOSPHORUS: Phosphorus: 3.8 mg/dL (ref 2.5–4.6)

## 2021-01-05 MED ORDER — GABAPENTIN 300 MG PO CAPS: 300.0000 mg | ORAL_CAPSULE | Freq: Two times a day (BID) | ORAL | Status: DC

## 2021-01-05 MED ORDER — SODIUM CHLORIDE 1 G PO TABS
1.0000 g | ORAL_TABLET | Freq: Three times a day (TID) | ORAL | Status: DC
  Administered 2021-01-05 – 2021-01-06 (×3): 1 g via ORAL
  Filled 2021-01-05 (×4): qty 1

## 2021-01-05 MED ORDER — INSULIN ASPART 100 UNIT/ML IJ SOLN
4.0000 [IU] | Freq: Three times a day (TID) | INTRAMUSCULAR | Status: DC
  Administered 2021-01-05 – 2021-01-06 (×4): 4 [IU] via SUBCUTANEOUS

## 2021-01-05 MED ORDER — GABAPENTIN 100 MG PO CAPS: 200.0000 mg | ORAL_CAPSULE | Freq: Two times a day (BID) | ORAL | Status: DC

## 2021-01-05 MED ORDER — GABAPENTIN 300 MG PO CAPS
300.0000 mg | ORAL_CAPSULE | Freq: Three times a day (TID) | ORAL | Status: DC
  Administered 2021-01-05 – 2021-01-06 (×3): 300 mg via ORAL
  Filled 2021-01-05 (×3): qty 1

## 2021-01-05 MED ORDER — GLUCERNA SHAKE PO LIQD
237.0000 mL | Freq: Three times a day (TID) | ORAL | Status: DC
  Administered 2021-01-05 – 2021-01-06 (×3): 237 mL via ORAL

## 2021-01-05 MED ORDER — PROSOURCE PLUS PO LIQD
30.0000 mL | Freq: Two times a day (BID) | ORAL | Status: DC
  Administered 2021-01-06: 30 mL via ORAL
  Filled 2021-01-05: qty 30

## 2021-01-05 MED ORDER — SODIUM CHLORIDE 0.9 % IV SOLN: INTRAVENOUS | Status: DC
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
## 2021-01-05 NOTE — Progress Notes (Signed)
Inpatient Diabetes Program Recommendations  AACE/ADA: New Consensus Statement on Inpatient Glycemic Control (2015)  Target Ranges:  Prepandial:   less than 140 mg/dL      Peak postprandial:   less than 180 mg/dL (1-2 hours)      Critically ill patients:  140 - 180 mg/dL   Lab Results  Component Value Date   GLUCAP 188 (H) 01/05/2021   HGBA1C 11.5 (H) 01/04/2021    Review of Glycemic Control Results for Chad Wolfe, Chad Wolfe (MRN [id number]) as of 01/05/2021 13:12  Ref. Range 01/04/2021 15:22 01/04/2021 19:24 01/04/2021 21:38 01/05/2021 06:11 01/05/2021 11:32  Glucose-Capillary Latest Ref Range: 70 - 99 mg/dL 623 (H) 762 (H) 831 (H) 166 (H) 188 (H)   Diabetes history: DM 2 Outpatient Diabetes medications: has taken metformin in the past Current orders for Inpatient glycemic control:  Novolog moderate tid with meals and HS Novolog 4 units tid with meals Semglee- 20 units daily  Inpatient Diabetes Program Recommendations:    Spoke to patient at bedside.  He states that has taken pill before for diabetes but he cannot tolerate it. Based on history, patient has taken metformin in the past.  Ideally patient would d/c on insulin based on A1C, however there is questions whether patient would actually take this at home.  Patient complains of neuropathy in his feet and states that "he cannot feel them".  This makes it hard for him to walk and get around.  He states that his brother is coming tomorrow and can help him at home if he waits to d/c tomorrow.  Unclear if patient is able to take insulin at home.  Will follow up on 01/06/21.  It would be ideal if he had assistance at home, but it sounds like his brother is only coming in for a few days.  May consider adding Amaryl 2 mg daily. Would not recommend Metformin due to history of ETOH abuse.  We reviewed normal blood sugar values, when to test, and potential need for insulin.  Patient very weak.  Will follow-up on 8/2.    Thanks  Beryl Meager, RN,  BC-ADM Inpatient Diabetes Coordinator Pager [phone] (8a-5p)

## 2021-01-05 NOTE — TOC Progression Note (Addendum)
Transition of Care [facility]) - Progression Note    Patient Details  Name: Chad Wolfe MRN: [id number] Date of Birth: [birth year]  Transition of Care [facility]) CM/SW Contact  Leone Haven, RN Phone Number: 01/05/2021, 12:07 PM  Clinical Narrative:    Patient does not have any insurance, he will need med ast, charity HHPT, PCP follow up.  Hospital followup is listed on [facility]S with [facility].  NCM left vm with Amy with [facility] who is the chairty agency today for referral for HHPT. Amy with [facility] declined referral , due to etoh abuse, stating it would be a safety concern for their clinicians and this would be a factor in patient retaining information for plan of care.  8/2- NCM made referral to [place] with [facility] for HHPT for charity, Awaiting call back.  NCM made referral to [facility] for rolling walker for charity as well. The meds will be sent to [facility] pharmacy to fill. NCM will assist with [facility]. Patient  states his sister will transport him home.         Expected Discharge Plan and Services                                                 Social Determinants of Health (SDOH) Interventions    Readmission Risk Interventions No flowsheet data found.

## 2021-01-06 ENCOUNTER — Other Ambulatory Visit (HOSPITAL_COMMUNITY): Payer: Self-pay

## 2021-01-06 LAB — COMPREHENSIVE METABOLIC PANEL
ALT: 104 U/L — ABNORMAL HIGH (ref 0–44)
AST: 120 U/L — ABNORMAL HIGH (ref 15–41)
Albumin: 2.5 g/dL — ABNORMAL LOW (ref 3.5–5.0)
Alkaline Phosphatase: 51 U/L (ref 38–126)
Anion gap: 8 (ref 5–15)
BUN: 10 mg/dL (ref 6–20)
CO2: 25 mmol/L (ref 22–32)
Calcium: 8.5 mg/dL — ABNORMAL LOW (ref 8.9–10.3)
Chloride: 100 mmol/L (ref 98–111)
Creatinine, Ser: 0.9 mg/dL (ref 0.61–1.24)
GFR, Estimated: >60 mL/min (ref >60)
Glucose, Bld: 177 mg/dL — ABNORMAL HIGH (ref 70–99)
Potassium: 3.4 mmol/L — ABNORMAL LOW (ref 3.5–5.1)
Sodium: 133 mmol/L — ABNORMAL LOW (ref 135–145)
Total Bilirubin: 1.5 mg/dL — ABNORMAL HIGH (ref 0.3–1.2)
Total Protein: 5.9 g/dL — ABNORMAL LOW (ref 6.5–8.1)

## 2021-01-06 LAB — GLUCOSE, CAPILLARY
Glucose-Capillary: 213 mg/dL — ABNORMAL HIGH (ref 70–99)
Glucose-Capillary: 146 mg/dL — ABNORMAL HIGH (ref 70–99)
Glucose-Capillary: 255 mg/dL — ABNORMAL HIGH (ref 70–99)

## 2021-01-06 MED ORDER — POTASSIUM CHLORIDE CRYS ER 20 MEQ PO TBCR
40.0000 meq | EXTENDED_RELEASE_TABLET | Freq: Once | ORAL | Status: AC
  Administered 2021-01-06: 40 meq via ORAL
  Filled 2021-01-06: qty 2

## 2021-01-06 MED ORDER — GLIPIZIDE 5 MG PO TABS
5.0000 mg | ORAL_TABLET | Freq: Two times a day (BID) | ORAL | 1 refills | Status: DC
  Filled 2021-01-06: qty 60, 30d supply, fill #0

## 2021-01-06 MED ORDER — LISINOPRIL 5 MG PO TABS
5.0000 mg | ORAL_TABLET | Freq: Every day | ORAL | 1 refills | Status: DC
  Filled 2021-01-06: qty 30, 30d supply, fill #0

## 2021-01-06 MED ORDER — THIAMINE HCL 100 MG PO TABS
100.0000 mg | ORAL_TABLET | Freq: Every day | ORAL | 1 refills | Status: DC
  Filled 2021-01-06: qty 30, 30d supply, fill #0

## 2021-01-06 MED ORDER — SODIUM CHLORIDE 1 G PO TABS
1.0000 g | ORAL_TABLET | Freq: Three times a day (TID) | ORAL | 0 refills | Status: AC
  Filled 2021-01-06: qty 9, 3d supply, fill #0

## 2021-01-06 MED ORDER — FOLIC ACID 1 MG PO TABS
1.0000 mg | ORAL_TABLET | Freq: Every day | ORAL | 1 refills | Status: DC
  Filled 2021-01-06: qty 30, 30d supply, fill #0

## 2021-01-06 MED ORDER — POTASSIUM CHLORIDE CRYS ER 20 MEQ PO TBCR
20.0000 meq | EXTENDED_RELEASE_TABLET | Freq: Every day | ORAL | 0 refills | Status: DC
  Filled 2021-01-06: qty 7, 7d supply, fill #0

## 2021-01-06 MED ORDER — LIVING WELL WITH DIABETES BOOK
Freq: Once | Status: DC
  Filled 2021-01-06: qty 1

## 2021-01-06 MED ORDER — GABAPENTIN 300 MG PO CAPS
300.0000 mg | ORAL_CAPSULE | Freq: Three times a day (TID) | ORAL | 1 refills | Status: DC
  Filled 2021-01-06: qty 90, 30d supply, fill #0

## 2021-01-06 NOTE — TOC Transition Note (Addendum)
Transition of Care [facility]) - CM/SW Discharge Note   Patient Details  Name: Chad Wolfe MRN: [id number] Date of Birth: 07-Jan-1966  Transition of Care [facility]) CM/SW Contact:  Leone Haven, RN Phone Number: 01/06/2021, 1:54 PM   Clinical Narrative:    Patient is for dc today, TOC to fill meds, NCM assisted with [employer].  NCM awaiting to  hear back from [place] with [facility] if they will take patient or not for charity for HHPT.  [employer] to supply the rolling walker for charity for patient.  Patient states his sister will transport hime home today.  He states his sister will be here around 5 pm. Per [place] with [facility] states patient would not provide brothers income they may not be able to provide HHPT for him.  Per [place] with [facility] , they can take referral for HHPT for charity for patient.     Final next level of care: Home/Self Care Barriers to Discharge: No Barriers Identified   Patient Goals and CMS Choice Patient states their goals for this hospitalization and ongoing recovery are:: return home      Discharge Placement                       Discharge Plan and Services                DME Arranged: Walker rolling DME Agency: [employer] Date DME Agency Contacted: 01/06/21 Time DME Agency Contacted: [phone] Representative spoke with at DME Agency: Silvio Pate HH Arranged: PT          Social Determinants of Health (SDOH) Interventions     Readmission Risk Interventions No flowsheet data found.
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
## 2021-01-06 NOTE — Social Work (Signed)
CSW received consult for Substance Abuse Counseling/Education-   CSW met with patient at bedside. CSW introduced self and explained role. CSW discussed with patient resources for ETOH. Patient states " I am done drinking" the thought of drinking is making me sick right now". Patient at first declined resources, stating that he doesn't need them, he has stopped drinking before, cold [place] and for 7 years and he can do it again. He  states  is aware where he can go for [employer], etc. However, after further conversation patient was willing to accept SA resources and [place]. Patient appeared motivated to stopped.  Thurmond Butts, MSW, LCSW Clinical Social Worker
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
## 2021-01-06 NOTE — Progress Notes (Signed)
Inpatient Diabetes Program Recommendations  AACE/ADA: New Consensus Statement on Inpatient Glycemic Control (2015)  Target Ranges:  Prepandial:   less than 140 mg/dL      Peak postprandial:   less than 180 mg/dL (1-2 hours)      Critically ill patients:  140 - 180 mg/dL   Lab Results  Component Value Date   GLUCAP 255 (H) 01/06/2021   HGBA1C 11.5 (H) 01/04/2021    Spoke with patient regarding d/c on oral medications for DM- Glipizide.  Discussed normal blood sugars of 80-130 mg/dl and less than 092 mg/dl after eating.  Gave patient a meter (from [place]) and allowed patient to practice checking blood sugar at bedside.   Discussed importance of f/u with PCP and eliminating sugar from beverages.  Patient verbalized understanding.  He states that his sister can help him with checking blood sugars.  Instructed him to check blood sugars 2 times a day and alert MD if greater than 250 mg/dL.   Explained that he may need additional medications added or maybe even insulin if blood sugars continue to be higher than goal.   Thanks,  Beryl Meager, RN, BC-ADM Inpatient Diabetes Coordinator Pager [phone]  (8a-5p)
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
## 2021-01-07 ENCOUNTER — Ambulatory Visit (INDEPENDENT_AMBULATORY_CARE_PROVIDER_SITE_OTHER): Payer: No Typology Code available for payment source | Admitting: Primary Care

## 2021-01-15 ENCOUNTER — Other Ambulatory Visit: Payer: Self-pay

## 2021-01-15 ENCOUNTER — Ambulatory Visit: Payer: Self-pay | Admitting: Physician Assistant

## 2021-01-15 VITALS — BP 100/68 | HR 108 | Resp 16 | Ht 69.0 in | Wt 152.0 lb

## 2021-01-15 DIAGNOSIS — I10 Essential (primary) hypertension: Secondary | ICD-10-CM

## 2021-01-15 DIAGNOSIS — Z6822 Body mass index (BMI) 22.0-22.9, adult: Secondary | ICD-10-CM

## 2021-01-15 DIAGNOSIS — F1010 Alcohol abuse, uncomplicated: Secondary | ICD-10-CM

## 2021-01-15 DIAGNOSIS — D75839 Thrombocytosis, unspecified: Secondary | ICD-10-CM

## 2021-01-15 DIAGNOSIS — E1169 Type 2 diabetes mellitus with other specified complication: Secondary | ICD-10-CM

## 2021-01-15 DIAGNOSIS — E559 Vitamin D deficiency, unspecified: Secondary | ICD-10-CM

## 2021-01-15 DIAGNOSIS — E875 Hyperkalemia: Secondary | ICD-10-CM

## 2021-01-15 DIAGNOSIS — B182 Chronic viral hepatitis C: Secondary | ICD-10-CM

## 2021-01-15 MED ORDER — GLIPIZIDE 5 MG PO TABS
5.0000 mg | ORAL_TABLET | Freq: Two times a day (BID) | ORAL | 1 refills | Status: DC
  Filled 2021-01-15 – 2021-01-28 (×2): qty 60, 30d supply, fill #0

## 2021-01-15 MED ORDER — GABAPENTIN 300 MG PO CAPS
300.0000 mg | ORAL_CAPSULE | Freq: Three times a day (TID) | ORAL | 1 refills | Status: DC
  Filled 2021-01-15 – 2021-01-28 (×2): qty 90, 30d supply, fill #0

## 2021-01-15 MED ORDER — THIAMINE HCL 100 MG PO TABS
100.0000 mg | ORAL_TABLET | Freq: Every day | ORAL | 1 refills | Status: DC
  Filled 2021-01-15: qty 30, 30d supply, fill #0

## 2021-01-15 MED ORDER — TRUE METRIX BLOOD GLUCOSE TEST VI STRP
ORAL_STRIP | 12 refills | Status: AC
Start: 2021-01-15 — End: ?
  Filled 2021-01-15: qty 100, 25d supply, fill #0

## 2021-01-15 MED ORDER — TRUE METRIX METER W/DEVICE KIT
1.0000 [IU] | PACK | Freq: Once | 0 refills | Status: AC
  Filled 2021-01-15: qty 1, 365d supply, fill #0
  Filled 2021-01-15: qty 1, 30d supply, fill #0

## 2021-01-15 MED ORDER — LISINOPRIL 5 MG PO TABS
5.0000 mg | ORAL_TABLET | Freq: Every day | ORAL | 1 refills | Status: DC
  Filled 2021-01-15 – 2021-01-28 (×2): qty 30, 30d supply, fill #0

## 2021-01-15 MED ORDER — TRUEPLUS LANCETS 28G MISC
12 refills | Status: AC
  Filled 2021-01-15: qty 100, 30d supply, fill #0

## 2021-01-15 MED ORDER — FOLIC ACID 1 MG PO TABS
1.0000 mg | ORAL_TABLET | Freq: Every day | ORAL | 1 refills | Status: DC
  Filled 2021-01-15 – 2021-01-28 (×2): qty 30, 30d supply, fill #0

## 2021-01-15 NOTE — Progress Notes (Signed)
Medication refill

## 2021-01-15 NOTE — Patient Instructions (Signed)
I have sent refills of your medications over to your pharmacy.  I encourage you to check your blood sugar levels twice daily, keep a written log, and have available for all office visits.  We will call you with today's lab results.  Please let me know if there is anything else we can do for you.  Roney Jaffe, PA-C Physician Assistant [facility] [email or web address]   https://www.diabeteseducator.org/docs/default-source/living-with-diabetes/conquering-the-grocery-store-v1.pdf?sfvrsn=4">  Carbohydrate Counting for Diabetes Mellitus, Adult Carbohydrate counting is a method of keeping track of how many carbohydrates you eat. Eating carbohydrates naturally increases the amount of sugar (glucose) in the blood. Counting how many carbohydrates you eat improves your bloodglucose control, which helps you manage your diabetes. It is important to know how many carbohydrates you can safely have in each meal. This is different for every person. A dietitian can help you make a meal plan and calculate how many carbohydrates you should have at each meal andsnack. What foods contain carbohydrates? Carbohydrates are found in the following foods: Grains, such as breads and cereals. Dried beans and soy products. Starchy vegetables, such as potatoes, peas, and corn. Fruit and fruit juices. Milk and yogurt. Sweets and snack foods, such as cake, cookies, candy, chips, and soft drinks. How do I count carbohydrates in foods? There are two ways to count carbohydrates in food. You can read food labels or learn standard serving sizes of foods. You can use either of the methods or acombination of both. Using the Nutrition Facts label The Nutrition Facts list is included on the labels of almost all packaged foods and beverages in the [place] It includes: The serving size. Information about nutrients in each serving, including the grams (g) of carbohydrate per  serving. To use the Nutrition Facts: Decide how many servings you will have. Multiply the number of servings by the number of carbohydrates per serving. The resulting number is the total amount of carbohydrates that you will be having. Learning the standard serving sizes of foods When you eat carbohydrate foods that are not packaged or do not include Nutrition Facts on the label, you need to measure the servings in order to count the amount of carbohydrates. Measure the foods that you will eat with a food scale or measuring cup, if needed. Decide how many standard-size servings you will eat. Multiply the number of servings by 15. For foods that contain carbohydrates, one serving equals 15 g of carbohydrates. For example, if you eat 2 cups or 10 oz (300 g) of strawberries, you will have eaten 2 servings and 30 g of carbohydrates (2 servings x 15 g = 30 g). For foods that have more than one food mixed, such as soups and casseroles, you must count the carbohydrates in each food that is included. The following list contains standard serving sizes of common carbohydrate-rich foods. Each of these servings has about 15 g of carbohydrates: 1 slice of bread. 1 six-inch (15 cm) tortilla. ? cup or 2 oz (53 g) cooked rice or pasta.  cup or 3 oz (85 g) cooked or canned, drained and rinsed beans or lentils.  cup or 3 oz (85 g) starchy vegetable, such as peas, corn, or squash.  cup or 4 oz (120 g) hot cereal.  cup or 3 oz (85 g) boiled or mashed potatoes, or  or 3 oz (85 g) of a large baked potato.  cup or 4 fl oz (118 mL) fruit juice. 1 cup or 8 fl oz (237 mL) milk. 1 small  or 4 oz (106 g) apple.  or 2 oz (63 g) of a medium banana. 1 cup or 5 oz (150 g) strawberries. 3 cups or 1 oz (24 g) popped popcorn. What is an example of carbohydrate counting? To calculate the number of carbohydrates in this sample meal, follow the stepsshown below. Sample meal 3 oz (85 g) chicken breast. ? cup or 4 oz  (106 g) brown rice.  cup or 3 oz (85 g) corn. 1 cup or 8 fl oz (237 mL) milk. 1 cup or 5 oz (150 g) strawberries with sugar-free whipped topping. Carbohydrate calculation Identify the foods that contain carbohydrates: Rice. Corn. Milk. Strawberries. Calculate how many servings you have of each food: 2 servings rice. 1 serving corn. 1 serving milk. 1 serving strawberries. Multiply each number of servings by 15 g: 2 servings rice x 15 g = 30 g. 1 serving corn x 15 g = 15 g. 1 serving milk x 15 g = 15 g. 1 serving strawberries x 15 g = 15 g. Add together all of the amounts to find the total grams of carbohydrates eaten: 30 g + 15 g + 15 g + 15 g = 75 g of carbohydrates total. What are tips for following this plan? Shopping Develop a meal plan and then make a shopping list. Buy fresh and frozen vegetables, fresh and frozen fruit, dairy, eggs, beans, lentils, and whole grains. Look at food labels. Choose foods that have more fiber and less sugar. Avoid processed foods and foods with added sugars. Meal planning Aim to have the same amount of carbohydrates at each meal and for each snack time. Plan to have regular, balanced meals and snacks. Where to find more information American Diabetes Association: www.diabetes.org Centers for Disease Control and Prevention: [employer].br Summary Carbohydrate counting is a method of keeping track of how many carbohydrates you eat. Eating carbohydrates naturally increases the amount of sugar (glucose) in the blood. Counting how many carbohydrates you eat improves your blood glucose control, which helps you manage your diabetes. A dietitian can help you make a meal plan and calculate how many carbohydrates you should have at each meal and snack. This information is not intended to replace advice given to you by your health care provider. Make sure you discuss any questions you have with your healthcare provider. Document Revised: 05/24/2019 Document  Reviewed: 05/25/2019 Elsevier Patient Education  2021 [employer].

## 2021-01-15 NOTE — Progress Notes (Signed)
New Patient Office Visit  Subjective:  Patient ID: Chad Wolfe, male    DOB: 26-Jun-1965  Age: 55 y.o. MRN: [id number]  CC:  Chief Complaint  Patient presents with   Diabetes     HPI Chad Wolfe reports that he was recently hospitalized from July 30 through January 06, 2021.  Hospital note:  Brief/Interim Summary:   Patient is a 55 year old male with history of chronic alcohol abuse, untreated hepatitis C, consult wound to the abdomen, diabetes type 2, hypertension who presented with complaints of feetpain/numbness.  He also reported nausea, abdominal pain, polyuria, polydipsia.  He does not take medications for his diabetes type 2.  He drinks two 40 ounce beers daily.  On presentation he was hypertensive.  Sodium was 129, glucose was 340, potassium was 5.4,mildly elevated liver enzymes.  Hemoglobin A1c found to be 11.5.He was found to have elevated anion gap, low CO2.  Patient was admitted for the management of DKA.  Diabetic coordinator following.  Gap has closed and he has been started on Lantus and sliding scale.  Hospital course remarkable for severe bilateral lower extremity numbness/tingling/weakness secondary to diabetic neuropathy.  PT/OT consulted and recommended home health.  Patient is medically stable for discharge today.   Following problems were addressed during his hospitalization:   DKA/diabetes type 2: Presented with polyuria, polydipsia, abdominal pain.  Currently not taking any medications at home.  Hemoglobin A1c found to be 11.5.  Diabetic coordinator following. Gap is closed.  Insulin drip discontinued.  Currently on Lantus and sliding scale.  Ideally he requires insulin on discharge but due to his current alcohol issues/noncompliance, he is not a ideal candidate for discharge on insulin.  We will consider discharging on oral antihyperglycemics, glipizide 5 mg twice a day after discussion with diabetic coordinator.   Bilateral lower extremity  weakness/numbness/tingling: Secondary to severe diabetic neuropathy and also could be from chronic alcohol abuse.  Started on gabapentin 300 mg 3 times a day.  PT/OT recommended HH   Chronic alcohol abuse: Currently not in withdrawal..  On thiamine and folic acid.  Drinks two 40 ounce of beer daily.  Counselled for cessation.   Elevated liver enzymes/hepatitis C: Secondary to chronic alcohol abuse, hepatitis C which is untreated.  CT abdomen/pelvis showed hepatic steatosis. We recommend follow-up with ID as an outpatient for treatment of hepatitis C.   Hyponatremia: Secondary to decreased solute intake, beer potomania.  Continue to monitor.  Improved with salt tablets   Hypertension:  Currently blood pressure more stable.  Continue lisinopril.       States today that he has been compliant to his medications.  Reports that he has been checking his blood glucose levels at home, states that he does have some higher readings, example given was 200.  States that he has been working to improve his lifestyle modifications and eating a lower sugar diet.  Reports that he has improved his hydration and drinking more water.  Reports that he continues to have numbness in both feet, states that the gabapentin does offer some relief.  Reports that he has not been checking his blood pressure at home.  Reports that he continues to state from alcohol, continues to state that he does not feel he needs help with quitting, states that he has not had any urges.     Past Medical History:  Diagnosis Date   Diabetes mellitus type 2 in nonobese ([place]) 01/21/2016   Gastric ulcer    Hepatitis C  Hyperlipidemia    Hypertension    Substance abuse ([place])     Past Surgical History:  Procedure Laterality Date   I & D EXTREMITY Right 02/26/2017   Procedure: IRRIGATION AND DEBRIDEMENT AND REDUCTION OF RIGHT MIDDLE FINGER;  Surgeon: Roseanne Kaufman, MD;  Location: [facility];  Service: Orthopedics;  Laterality: Right;    STOMACH SURGERY      Family History  Problem Relation Age of Onset   Diabetes Father    Hypertension Father    Diabetes Sister    Hypertension Sister    Diabetes Brother    Hypertension Brother     Social History   Socioeconomic History   Marital status: Divorced    Spouse name: Not on file   Number of children: Not on file   Years of education: Not on file   Highest education level: Not on file  Occupational History   Not on file  Tobacco Use   Smoking status: Some Days   Smokeless tobacco: Never  Substance and Sexual Activity   Alcohol use: Yes    Alcohol/week: 0.0 standard drinks    Comment: drank a quart of beer this morning PT DENIES 09/26/15   Drug use: No    Types: Cocaine, Oxycodone    Comment: oxycodone 2 days ago "my sister gave me one"; no cocaine use   Sexual activity: Not on file    Comment: opiates  Other Topics Concern   Not on file  Social History Narrative   Not on file   Social Determinants of Health   Financial Resource Strain: Not on file  Food Insecurity: Not on file  Transportation Needs: Not on file  Physical Activity: Not on file  Stress: Not on file  Social Connections: Not on file  Intimate Partner Violence: Not on file    ROS Review of Systems  Constitutional: Negative.   HENT: Negative.    Eyes: Negative.   Respiratory:  Negative for shortness of breath.   Cardiovascular:  Negative for chest pain and palpitations.  Gastrointestinal: Negative.   Endocrine: Negative.   Genitourinary: Negative.   Musculoskeletal: Negative.   Skin: Negative.   Allergic/Immunologic: Negative.   Neurological: Negative.   Hematological: Negative.   Psychiatric/Behavioral: Negative.     Objective:   Today's Vitals: BP 100/68   Pulse (!) 108   Resp 16   Ht $R'5\' 9"'uR$  (1.753 m)   Wt 152 lb (68.9 kg)   SpO2 98%   BMI 22.45 kg/m   Physical Exam Vitals and nursing note reviewed.  Constitutional:      Appearance: Normal appearance.  HENT:      Head: Normocephalic and atraumatic.     Right Ear: External ear normal.     Left Ear: External ear normal.     Nose: Nose normal.     Mouth/Throat:     Mouth: Mucous membranes are moist.     Pharynx: Oropharynx is clear.  Eyes:     Extraocular Movements: Extraocular movements intact.     Conjunctiva/sclera: Conjunctivae normal.     Pupils: Pupils are equal, round, and reactive to light.  Cardiovascular:     Rate and Rhythm: Normal rate and regular rhythm.     Pulses: Normal pulses.     Heart sounds: Normal heart sounds.  Pulmonary:     Effort: Pulmonary effort is normal.     Breath sounds: Normal breath sounds.  Musculoskeletal:        General: Normal range of motion.  Cervical back: Normal range of motion and neck supple.  Skin:    General: Skin is warm and dry.  Neurological:     General: No focal deficit present.     Mental Status: He is alert and oriented to person, place, and time.  Psychiatric:        Mood and Affect: Mood normal.        Behavior: Behavior normal.        Thought Content: Thought content normal.        Judgment: Judgment normal.    Assessment & Plan:   Problem List Items Addressed This Visit       Cardiovascular and Mediastinum   HTN (hypertension) (Chronic)   Relevant Medications   lisinopril (ZESTRIL) 5 MG tablet     Digestive   Chronic hepatitis C virus infection ([place])   Relevant Orders   HCV Ab w Reflex to Quant PCR     Endocrine   Diabetes mellitus type 2 in nonobese (HCC) - Primary   Relevant Medications   glipiZIDE (GLUCOTROL) 5 MG tablet   gabapentin (NEURONTIN) 300 MG capsule   lisinopril (ZESTRIL) 5 MG tablet   TRUEplus Lancets 28G MISC   Blood Glucose Monitoring Suppl (TRUE METRIX METER) w/Device KIT   glucose blood (TRUE METRIX BLOOD GLUCOSE TEST) test strip     Other   Alcohol abuse   Relevant Medications   thiamine 497 MG tablet   folic acid (FOLVITE) 1 MG tablet   BMI 22.0-22.9, adult    Outpatient  Encounter Medications as of 01/15/2021  Medication Sig   Blood Glucose Monitoring Suppl (TRUE METRIX METER) w/Device KIT 1 Units by Does not apply route once for 1 dose.   cyanocobalamin 1000 MCG tablet Take 1,000 mcg by mouth daily.   glucose blood (TRUE METRIX BLOOD GLUCOSE TEST) test strip Use as instructed   Omega-3 Fatty Acids (FISH OIL) 1000 MG CAPS Take by mouth.   potassium chloride SA (KLOR-CON) 20 MEQ tablet Take 1 tablet (20 mEq total) by mouth daily.   TRUEplus Lancets 28G MISC use as directed   [DISCONTINUED] folic acid (FOLVITE) 1 MG tablet Take 1 tablet (1 mg total) by mouth daily.   [DISCONTINUED] gabapentin (NEURONTIN) 300 MG capsule Take 1 capsule (300 mg total) by mouth 3 (three) times daily.   [DISCONTINUED] glipiZIDE (GLUCOTROL) 5 MG tablet Take 1 tablet (5 mg total) by mouth 2 (two) times daily.   [DISCONTINUED] lisinopril (ZESTRIL) 5 MG tablet Take 1 tablet (5 mg total) by mouth daily.   [DISCONTINUED] thiamine 100 MG tablet Take 1 tablet (100 mg total) by mouth daily.   folic acid (FOLVITE) 1 MG tablet Take 1 tablet (1 mg total) by mouth daily.   gabapentin (NEURONTIN) 300 MG capsule Take 1 capsule (300 mg total) by mouth 3 (three) times daily.   glipiZIDE (GLUCOTROL) 5 MG tablet Take 1 tablet (5 mg total) by mouth 2 (two) times daily.   lisinopril (ZESTRIL) 5 MG tablet Take 1 tablet (5 mg total) by mouth daily.   thiamine 100 MG tablet Take 1 tablet (100 mg total) by mouth daily.   No facility-administered encounter medications on file as of 01/15/2021.  1. Type 2 diabetes mellitus with other specified complication, without long-term current use of insulin (HCC) Continue current regimen.  Patient encouraged to check blood glucose levels at home, keep a written log and have available for all office visits.  Patient has upcoming appointment to establish care at [place]  at [facility] on February 05, 2021.  Patient education given on diabetic diet  Patient was given  application for [place] financial assistance, [facility] to [facility] constraints   - glipiZIDE (GLUCOTROL) 5 MG tablet; Take 1 tablet (5 mg total) by mouth 2 (two) times daily.  Dispense: 60 tablet; Refill: 1 - gabapentin (NEURONTIN) 300 MG capsule; Take 1 capsule (300 mg total) by mouth 3 (three) times daily.  Dispense: 90 capsule; Refill: 1 - CBC with Differential/Platelet - Comp. Metabolic Panel (12) - Vitamin D, 25-hydroxy - TRUEplus Lancets 28G MISC; use as directed  Dispense: 100 each; Refill: 12 - Blood Glucose Monitoring Suppl (TRUE METRIX METER) w/Device KIT; 1 Units by Does not apply route once for 1 dose.  Dispense: 1 kit; Refill: 0 - glucose blood (TRUE METRIX BLOOD GLUCOSE TEST) test strip; Use as instructed  Dispense: 100 each; Refill: 12  2. Primary hypertension Continue current regimen.  Patient encouraged to check blood pressure at home, keep a written log and have available follow-up - lisinopril (ZESTRIL) 5 MG tablet; Take 1 tablet (5 mg total) by mouth daily.  Dispense: 30 tablet; Refill: 1  3. Chronic hepatitis C without hepatic coma ([place]) Patient had stated no previous treatment, will refer to infectious disease if appropriate - HCV Ab w Reflex to Quant PCR  4. Alcohol abuse Patient again refuses counseling referral  - thiamine 100 MG tablet; Take 1 tablet (100 mg total) by mouth daily.  Dispense: 30 tablet; Refill: 1 - folic acid (FOLVITE) 1 MG tablet; Take 1 tablet (1 mg total) by mouth daily.  Dispense: 30 tablet; Refill: 1  5. BMI 22.0-22.9, adult   I have reviewed the patient's medical history (PMH, PSH, Social History, Family History, Medications, and allergies) , and have been updated if relevant. I spent 32 minutes reviewing chart and  face to face time with patient.     Follow-up: Return in about 3 weeks (around 02/05/2021) for with Dr. Redmond Pulling at [facility].   Loraine Grip  [name], PA-C

## 2021-01-16 ENCOUNTER — Other Ambulatory Visit: Payer: Self-pay

## 2021-01-21 LAB — CBC WITH DIFFERENTIAL/PLATELET
Basophils Absolute: 0.1 10*3/uL (ref 0.0–0.2)
Basos: 1 %
EOS (ABSOLUTE): 0.1 10*3/uL (ref 0.0–0.4)
Eos: 1 %
Hematocrit: 47 % (ref 37.5–51.0)
Hemoglobin: 15.9 g/dL (ref 13.0–17.7)
Immature Grans (Abs): 0 10*3/uL (ref 0.0–0.1)
Immature Granulocytes: 0 %
Lymphocytes Absolute: 2.1 10*3/uL (ref 0.7–3.1)
Lymphs: 23 %
MCH: 32.4 pg (ref 26.6–33.0)
MCHC: 33.8 g/dL (ref 31.5–35.7)
MCV: 96 fL (ref 79–97)
Monocytes Absolute: 0.9 10*3/uL (ref 0.1–0.9)
Monocytes: 10 %
Neutrophils Absolute: 6 10*3/uL (ref 1.4–7.0)
Neutrophils: 65 %
Platelets: 452 10*3/uL — ABNORMAL HIGH (ref 150–450)
RBC: 4.9 x10E6/uL (ref 4.14–5.80)
RDW: 11.2 % — ABNORMAL LOW (ref 11.6–15.4)
WBC: 9.3 10*3/uL (ref 3.4–10.8)

## 2021-01-21 LAB — COMP. METABOLIC PANEL (12)
AST: 35 IU/L (ref 0–40)
Albumin/Globulin Ratio: 1.1 — ABNORMAL LOW (ref 1.2–2.2)
Albumin: 4.1 g/dL (ref 3.8–4.9)
Alkaline Phosphatase: 95 IU/L (ref 44–121)
BUN/Creatinine Ratio: 13 (ref 9–20)
BUN: 12 mg/dL (ref 6–24)
Bilirubin Total: 0.4 mg/dL (ref 0.0–1.2)
Calcium: 9.9 mg/dL (ref 8.7–10.2)
Chloride: 93 mmol/L — ABNORMAL LOW (ref 96–106)
Creatinine, Ser: 0.94 mg/dL (ref 0.76–1.27)
Globulin, Total: 3.7 g/dL (ref 1.5–4.5)
Glucose: 411 mg/dL — ABNORMAL HIGH (ref 65–99)
Potassium: 5.5 mmol/L — ABNORMAL HIGH (ref 3.5–5.2)
Sodium: 135 mmol/L (ref 134–144)
Total Protein: 7.8 g/dL (ref 6.0–8.5)
eGFR: 95 mL/min/{1.73_m2} (ref >59)

## 2021-01-21 LAB — HCV AB W REFLEX TO QUANT PCR: HCV Ab: >11 s/co ratio — ABNORMAL HIGH (ref 0.0–0.9)

## 2021-01-21 LAB — HCV RT-PCR, QUANT (NON-GRAPH)
HCV log10: 6.342 log10 IU/mL
Hepatitis C Quantitation: 2200000 IU/mL

## 2021-01-21 LAB — VITAMIN D 25 HYDROXY (VIT D DEFICIENCY, FRACTURES): Vit D, 25-Hydroxy: 21.9 ng/mL — ABNORMAL LOW (ref 30.0–100.0)

## 2021-01-21 NOTE — Addendum Note (Signed)
Addended by: Roney Jaffe on: 01/21/2021 10:55 AM   Modules accepted: Orders

## 2021-01-22 ENCOUNTER — Telehealth: Payer: Self-pay | Admitting: *Deleted

## 2021-01-22 NOTE — Telephone Encounter (Signed)
Patient verified DOB Patient is aware of results and treatment. Patient has appointment with [facility] on 8/25 at 3pm and patient will follow up with PCP on 9/1 and have labs monitored.

## 2021-01-22 NOTE — Telephone Encounter (Signed)
-----   Message from Roney Jaffe, [place] sent at 01/21/2021 10:55 AM EDT ----- Please call patient and let him know that his kidney function is within normal limits, his liver function has improved.  His potassium levels are elevated, this is more than likely due to recent potassium treatment which he has completed.  His vitamin D levels are low, he needs to take 2000 international units once a day.  He will purchase this over-the-counter.  His platelet count is slightly elevated, we will continue to monitor this.  His testing for hepatitis C did show active virus, I am starting a referral for him to be seen by infectious disease.  He should continue his follow-up with his primary care provider or he is welcome to return to the mobile unit if needed

## 2021-01-28 ENCOUNTER — Other Ambulatory Visit: Payer: Self-pay

## 2021-01-28 ENCOUNTER — Other Ambulatory Visit: Payer: Self-pay | Admitting: Family Medicine

## 2021-01-29 ENCOUNTER — Encounter: Payer: Self-pay | Admitting: Infectious Diseases

## 2021-02-02 ENCOUNTER — Other Ambulatory Visit: Payer: Self-pay

## 2021-02-02 ENCOUNTER — Ambulatory Visit: Payer: Self-pay | Attending: Family Medicine

## 2021-02-04 ENCOUNTER — Other Ambulatory Visit (HOSPITAL_COMMUNITY): Payer: Self-pay

## 2021-02-04 ENCOUNTER — Telehealth: Payer: Self-pay

## 2021-02-04 ENCOUNTER — Ambulatory Visit (INDEPENDENT_AMBULATORY_CARE_PROVIDER_SITE_OTHER): Payer: Self-pay | Admitting: Infectious Diseases

## 2021-02-04 ENCOUNTER — Encounter: Payer: Self-pay | Admitting: Infectious Diseases

## 2021-02-04 ENCOUNTER — Other Ambulatory Visit: Payer: Self-pay

## 2021-02-04 VITALS — BP 112/79 | HR 100 | Temp 97.5°F | Wt 159.0 lb

## 2021-02-04 DIAGNOSIS — Z5989 Other problems related to housing and economic circumstances: Secondary | ICD-10-CM | POA: Insufficient documentation

## 2021-02-04 DIAGNOSIS — B182 Chronic viral hepatitis C: Secondary | ICD-10-CM

## 2021-02-04 DIAGNOSIS — R7401 Elevation of levels of liver transaminase levels: Secondary | ICD-10-CM | POA: Insufficient documentation

## 2021-02-04 NOTE — Assessment & Plan Note (Signed)
Will plan on epclusa patient assistance (simple regimen I think will be best for him, he has a lot of new medicatoin to him)  [facility] financial application to be completed today.   Will ask PCP to give Pneumococcal vaccines at [facility] tomorrow.

## 2021-02-04 NOTE — Assessment & Plan Note (Signed)
New Patient with Chronic Hepatitis C genotype unknown, fibrosis risk unknown, treatment naive.  Seems as if risk factor was remote with unregulated tattoos during incarceration.   I discussed with the patient the lab findings that confirm chronic hepatitis C as well as the natural history and progression of disease including about 30% of people who develop cirrhosis of the liver if left untreated and once cirrhosis is established there is a 2-7% risk per year of liver cancer and liver failure.  I discussed the importance of treatment and benefits in reducing the risk, even if significant liver fibrosis exists. I also discussed risk for re-infection following treatment should he not continue to modify risk factors.    Patient counseled extensively on limiting acetaminophen to no more than 2 grams daily, avoidance of alcohol.   Transmission discussed with patient including sexual transmission, sharing razors and toothbrush.   Will need referral to gastroenterology if concern for cirrhosis  Will prescribe appropriate medication based on genotype and coverage   Hepatitis A and B titers to be drawn today with appropriate vaccinations as needed   Pneumovax vaccine at upcoming visit if not previously given  Further work up to include liver staging through non-invasive serum analysis with Liver Fibrosis panel; U/S to follow if discordant or concerning results.  Will call Lynnea Ferrier back once all results are in and counsel on medication over the phone. He will return 4 weeks after starting to meet with pharmacy team and check RNA at that time.

## 2021-02-04 NOTE — Patient Instructions (Addendum)
Thiamin is the vitamin that is over the count you should pick up.   At your appointment tomorrow I want you to talk with them about your foot pain and follow up on your diabetes.   You will come here for a few months so we can cure your hepatitis C.   Nice to meet you today!    We need to get a little more information about your hepatitis c infection before we start your treatment. I anticipate that we can get you started in a few weeks after we submit approval to your insurance to ensure payment. We may need to place referral for an ultrasound and/or gastroenterology if your blood work indicates more damage to the liver than expected.     ABOUT HEPATITIS C VIRUS:  Chronic Hepatitis C is the most common blood-borne infection in the [place], affecting approximately 3 million people.  It is the leading cause of cirrhosis, liver cancer, and end stage liver disease requiring transplantation when this infection goes untreated for many years  The majority of people who are infected are unaware because there are not many early symptoms that are specific to this and often go undiagnosed until a specific blood test is drawn.   The hepatitis c virus is passed primarily through direct exposure of contaminated blood or body fluids. It is most efficiently transmitted through repeated exposure to infected blood.  Risk for sexual transmission is very low but is possible if there is high frequency of unprotected sexual activity with known hepatitis c partner or multiple partners of known status.  Over time, approximately 60-70% of people can develop some degree of liver disease. Cirrhosis occurs in 10-20% of those with chronic infection. 1-5% will get liver cancer, which has a very high rate of death.   Approximately 15-25% clear the infection without medication (usually in the first 6 months of becoming exposed to virus)  Newer medications provide over 95% cure rate when taken as  prescribed    IN GENERAL ABOUT DIET  Persons living with chronic hepatitis c infection should eat a diet to maintain a healthy weight and avoid nutritional deficiencies.   Completely avoiding alcohol is the best decision for your liver health. If unable to do so please limit alcohol to as little as possible to less than 1 standard drink a day - this is very irritating to your liver.  Limit tylenol use to less than 2,000 mg daily (two extra strength tablets only twice a day)  If you have cirrhosis of the liver please take no more than 1,000 mg tylenol a day  Patients with cirrhosis should not have protein restriction; we recommend a protein intake of approximately 1.2-1.5 g/kg/day.   For patients with cirrhosis and hepatic encephalopathy, the American Association for the Study of Liver Diseases (AASLD) recommended protein intake is 1.2-1.5 g/kg/day.  If you experience ascites (fluid accumulation in the abdomen associated with severe liver damage / cirrhosis) please limit sodium intake to < 2000 mg a day    UNTIL YOU HAVE BEEN TREATED AND CURED:  Use condoms with all sexual encounters or practice abstinence to avoid sexual transmission   No sharing of razors, toothbrushes, nail clippers or anything that could potentially have blood on it.   If you cut yourself please clean and cover any wounds or open sores to others do not come into contact with your blood.   If blood spills onto item/surface please clean with 1:10 bleach solution and allow to dry,  EVEN if it is dried blood.    GENERAL HELPFUL HINTS ON HCV THERAPY:  1. Stay well-hydrated.  2. Notify the [facility] Clinic of any changes in your other over-the-counter/herbal or prescription medications.  3. If you miss a dose of your medication, take the missed dose as soon as you remember. Return to your regular time/dose schedule the next day.   4.  Do not stop taking your medications without first talking with your healthcare  provider.  5.  You will see our pharmacist-specialist within the first 2 weeks of starting your medication to monitor for any possible side effects.  6.  You will have blood work once during treatment 4 weeks after your first pill. Again soon after treatment is completed and one final lab 3 months after your last pill to ensure cure!   TIPS TO BE SUCCESSFUL WITH DAILY MEDICATION USE:  1. Set a reminder on your phone  2. Try filling out a pill box for the week - pick a day and put one pill for every day during the week so you know right away if you missed a pill.   3. Have a trusted family member ask you about your medications.   4. Smartphone app    Medication we would like to use for you will be : Fransisca Connors Instructions:  Take Epclusa, one tablet daily with or without food. You should take it at approximately the same time every day. Your treatment will be for 12 weeks. Do not miss a dose.    Do not run out of Epclusa! If you are down to one week of medication left and have not heard about your next shipment, please let us know as soon as possible. You will be given 28 days of medication at a time and provided with 2 refills.   If you need to start a new medication, prescription from your doctor or over the counter medication, you need to contact us to make sure it does not interfere with Epclusa. There are several medications that can interfere with Epclusa and can make you sick or make the medication not work.  If you need to take a medication for acid reflux, your choices are as follows: TUMS or Rolaids separated at least four hours from [place].  Pepcid (famotidine) 40mg  up to twice per day administered with Epclusa or 12 hours apart from .    Tylenol (acetominophen) and Advil (ibuprofen) are safe to take with Epclusa if needed for headache, fever, pain.   DO NOT stop Epclusa unless instructed to by your provider. If you are hospitalized while taking this  medication please bring it with you to the hospital to avoid interruption of therapy. Every pill is important!  The most common side effects associated with Epclusa  include:  Fatigue Headache Nausea Diarrhea Insomnia

## 2021-02-04 NOTE — Telephone Encounter (Signed)
[facility] Patient Advocate Encounter ? ?Insurance verification completed.   ? ?The patient is uninsured and will need patient assistance for medication. ? ?We can complete the application and will need to meet with the patient for signatures and income documentation. ? ?Dvante Hands, CPhT ?Specialty Pharmacy Patient Advocate ?[facility] ?Phone: [phone] ?Fax:  [phone]  ?

## 2021-02-04 NOTE — Progress Notes (Signed)
Patient Name: Chad Wolfe  Date of Birth: 12-16-1965  MRN: [id number]  PCP: Pete Glatter, MD (Inactive)  Referring Provider: Diona Foley, Ph#: [phone]    CC:  New patient - initial evaluation and management of chronic hepatitis C infection.  HPI/ROS:  Chad Wolfe is a 55 y.o. male referred to [place] for hepatitis c evaluation and management.   Recently diagnosed with diabetes and started on medications. Hospitalized recently with severe alcohol use disorder. He has since stopped drinking alcohol and sodas. He says his liver and kidney function tests recently were much better and healing. He feels better since discharge for sure and has continued to work with his primary care team. Remarks on neuropathy and foot pain today.   He first heard about hepatitis c infection about 10 years ago. Risk factor seems to be related to incarceration history and/or multiple unregulated tattoos. He recalls at one point being evaluated with what sounds like a liver biopsy but never had treatment for this.   No sexual partners currently. Denies any illicit drug use. HIV test recently non reactive.   Patient does not have documented immunity to Hepatitis A. Patient does not have documented immunity to Hepatitis B.     Review of Systems  Constitutional:  Negative for chills and fever.  HENT:  Negative for tinnitus.   Eyes:  Negative for photophobia.  Respiratory:  Negative for cough.   Cardiovascular:  Negative for chest pain.  Gastrointestinal:  Negative for diarrhea, nausea and vomiting.  Genitourinary:  Negative for dysuria.  Skin:  Negative for rash.  Neurological:  Negative for headaches.   All other systems reviewed and are negative      Past Medical History:  Diagnosis Date   Diabetes mellitus type 2 in nonobese (HCC) 01/21/2016   Gastric ulcer    Hepatitis C    Hyperlipidemia    Hypertension    Substance abuse (HCC)     Prior to Admission medications    Medication Sig Start Date End Date Taking? Authorizing Provider  folic acid (FOLVITE) 1 MG tablet Take 1 tablet (1 mg total) by mouth daily. 01/15/21  Yes [name], [name], PA-C  gabapentin (NEURONTIN) 300 MG capsule Take 1 capsule (300 mg total) by mouth 3 (three) times daily. 01/15/21  Yes [name], [name], PA-C  glipiZIDE (GLUCOTROL) 5 MG tablet Take 1 tablet (5 mg total) by mouth 2 (two) times daily. 01/15/21 01/15/22 Yes [name], [name], PA-C  glucose blood (TRUE METRIX BLOOD GLUCOSE TEST) test strip Use as instructed 01/15/21  Yes [name], [name], PA-C  lisinopril (ZESTRIL) 5 MG tablet Take 1 tablet (5 mg total) by mouth daily. 01/15/21  Yes [name], [name], PA-C  Omega-3 Fatty Acids (FISH OIL) 1000 MG CAPS Take by mouth.   Yes [provider]  thiamine 100 MG tablet Take 1 tablet (100 mg total) by mouth daily. 01/15/21  Yes [name], [name], PA-C  TRUEplus Lancets 28G MISC use as directed 01/15/21  Yes [name], [name], PA-C  cyanocobalamin 1000 MCG tablet Take 1,000 mcg by mouth daily. Patient not taking: Reported on 02/04/2021    [provider]  potassium chloride SA (KLOR-CON) 20 MEQ tablet Take 1 tablet (20 mEq total) by mouth daily. Patient not taking: Reported on 02/04/2021 01/06/21   [name], MD    Allergies  Allergen Reactions   Ibuprofen Other (See Comments)    Stomach upset   Aspirin Other (See Comments)  Other Other (See Comments)    MD told him not to take [facility] due to Hep C   Tylenol [Acetaminophen] Other (See Comments)    HEP C    Social History   Tobacco Use   Smoking status: Never   Smokeless tobacco: Never  Substance Use Topics   Alcohol use: Yes    Alcohol/week: 0.0 standard drinks    Comment: drank a quart of beer this morning PT DENIES 09/26/15   Drug use: No    Types: Cocaine, Oxycodone    Comment: oxycodone 2 days ago "my sister gave me one"; no cocaine use    Family History  Problem Relation Age of Onset   Diabetes Father    Hypertension  Father    Diabetes Sister    Hypertension Sister    Diabetes Brother    Hypertension Brother      Objective:   Vitals:   02/04/21 1556  BP: 112/79  Pulse: 100  Temp: (!) 97.5 F (36.4 C)  SpO2: 95%   Constitutional: in no apparent distress, well developed and well nourished, and oriented times 3 Eyes: anicteric Cardiovascular: Cor RRR and No murmurs Respiratory: clear Gastrointestinal: Bowel sounds are normal, liver is not enlarged, spleen is not enlarged Musculoskeletal: peripheral pulses normal, no pedal edema, no clubbing or cyanosis Skin: negative for - jaundice, spider hemangioma, telangiectasia, palmar erythema, ecchymosis and atrophy; no porphyria cutanea tarda Lymphatic: no cervical lymphadenopathy   Laboratory: Genotype:  Lab Results  Component Value Date   HCVGENOTYPE 1a 12/20/2007   HCV viral load:  Lab Results  Component Value Date   HCVQUANT 594,851 (H) 09/02/2014   Lab Results  Component Value Date   WBC 9.3 01/15/2021   HGB 15.9 01/15/2021   HCT 47.0 01/15/2021   MCV 96 01/15/2021   PLT 452 (H) 01/15/2021    Lab Results  Component Value Date   CREATININE 0.94 01/15/2021   BUN 12 01/15/2021   NA 135 01/15/2021   K 5.5 (H) 01/15/2021   CL 93 (L) 01/15/2021   CO2 25 01/06/2021    Lab Results  Component Value Date   ALT 104 (H) 01/06/2021   AST 35 01/15/2021   ALKPHOS 95 01/15/2021    Lab Results  Component Value Date   INR 0.95 02/04/2011   BILITOT 0.4 01/15/2021   ALBUMIN 4.1 01/15/2021     Imaging:  CT scan reviewed - no masses/tumors. Hepatic steatosis. No definitive cirrhotic findings.   Assessment & Plan:   Problem List Items Addressed This Visit       Unprioritized   Uninsured    Will plan on epclusa patient assistance (simple regimen I think will be best for him, he has a lot of new medicatoin to him)  [facility] financial application to be completed today.   Will ask PCP to give Pneumococcal vaccines at [facility] tomorrow.        Transaminitis    He has had a big improvement in ALT with normalization since he stopped drinking. I encouraged him to continue complete alcohol cessation for overall liver health.  No signs of decompensated liver disease or cirrhosis. Will follow blood work.       Chronic hepatitis C virus infection (HCC) - Primary    New Patient with Chronic Hepatitis C genotype unknown, fibrosis risk unknown, treatment naive.  Seems as if risk factor was remote with unregulated tattoos during incarceration.   I discussed with the patient the lab findings that confirm chronic  hepatitis C as well as the natural history and progression of disease including about 30% of people who develop cirrhosis of the liver if left untreated and once cirrhosis is established there is a 2-7% risk per year of liver cancer and liver failure.  I discussed the importance of treatment and benefits in reducing the risk, even if significant liver fibrosis exists. I also discussed risk for re-infection following treatment should he not continue to modify risk factors.   Patient counseled extensively on limiting acetaminophen to no more than 2 grams daily, avoidance of alcohol.  Transmission discussed with patient including sexual transmission, sharing razors and toothbrush.  Will need referral to gastroenterology if concern for cirrhosis Will prescribe appropriate medication based on genotype and coverage  Hepatitis A and B titers to be drawn today with appropriate vaccinations as needed  Pneumovax vaccine at upcoming visit if not previously given Further work up to include liver staging through non-invasive serum analysis with Liver Fibrosis panel; U/S to follow if discordant or concerning results.  Will call Lynnea Ferrier back once all results are in and counsel on medication over the phone. He will return 4 weeks after starting to meet with pharmacy team and check RNA at that time.        Relevant Orders   Liver  Fibrosis, FibroTest-ActiTest   Hepatitis C genotype   Protime-INR   Hepatitis B surface antibody,qualitative   Hepatitis B surface antigen   Hepatitis A Ab, Total    I spent 45 minutes with the patient including face to face counsel of the patient re hepatitis c and the details described above and in coordination of their care.  Rexene Alberts, MSN, NP-C [facility]  [email or web address] Pager: [phone] Office: [phone] [facility] Main Line: [phone]

## 2021-02-04 NOTE — Assessment & Plan Note (Signed)
He has had a big improvement in ALT with normalization since he stopped drinking. I encouraged him to continue complete alcohol cessation for overall liver health.  No signs of decompensated liver disease or cirrhosis. Will follow blood work.

## 2021-02-05 ENCOUNTER — Encounter: Payer: Self-pay | Admitting: Family Medicine

## 2021-02-05 ENCOUNTER — Other Ambulatory Visit: Payer: Self-pay

## 2021-02-05 ENCOUNTER — Ambulatory Visit (INDEPENDENT_AMBULATORY_CARE_PROVIDER_SITE_OTHER): Payer: Self-pay | Admitting: Family Medicine

## 2021-02-05 VITALS — BP 110/73 | HR 89 | Temp 98.3°F | Resp 16 | Wt 157.4 lb

## 2021-02-05 DIAGNOSIS — F1010 Alcohol abuse, uncomplicated: Secondary | ICD-10-CM

## 2021-02-05 DIAGNOSIS — Z87891 Personal history of nicotine dependence: Secondary | ICD-10-CM

## 2021-02-05 DIAGNOSIS — I10 Essential (primary) hypertension: Secondary | ICD-10-CM

## 2021-02-05 DIAGNOSIS — Z7689 Persons encountering health services in other specified circumstances: Secondary | ICD-10-CM

## 2021-02-05 DIAGNOSIS — E119 Type 2 diabetes mellitus without complications: Secondary | ICD-10-CM

## 2021-02-05 DIAGNOSIS — E1169 Type 2 diabetes mellitus with other specified complication: Secondary | ICD-10-CM

## 2021-02-05 DIAGNOSIS — Z87898 Personal history of other specified conditions: Secondary | ICD-10-CM

## 2021-02-05 DIAGNOSIS — B182 Chronic viral hepatitis C: Secondary | ICD-10-CM

## 2021-02-05 LAB — POCT GLYCOSYLATED HEMOGLOBIN (HGB A1C): Hemoglobin A1C: 10.6 % — AB (ref 4.0–5.6)

## 2021-02-05 MED ORDER — FOLIC ACID 1 MG PO TABS
1.0000 mg | ORAL_TABLET | Freq: Every day | ORAL | 1 refills | Status: DC
Start: 2021-02-05 — End: 2022-01-19
  Filled 2021-02-05: qty 90, 90d supply, fill #0

## 2021-02-05 MED ORDER — GABAPENTIN 300 MG PO CAPS
300.0000 mg | ORAL_CAPSULE | Freq: Three times a day (TID) | ORAL | 3 refills | Status: DC
  Filled 2021-02-05 – 2021-02-23 (×2): qty 90, 30d supply, fill #0

## 2021-02-05 MED ORDER — LISINOPRIL 5 MG PO TABS
5.0000 mg | ORAL_TABLET | Freq: Every day | ORAL | 0 refills | Status: DC
Start: 2021-02-05 — End: 2021-05-11
  Filled 2021-02-05: qty 90, 90d supply, fill #0
  Filled 2021-02-23: qty 30, 30d supply, fill #0
  Filled 2021-03-23: qty 30, 30d supply, fill #1
  Filled 2021-04-20: qty 30, 30d supply, fill #2

## 2021-02-05 MED ORDER — GLIPIZIDE 5 MG PO TABS
5.0000 mg | ORAL_TABLET | Freq: Two times a day (BID) | ORAL | 0 refills | Status: DC
  Filled 2021-02-05: qty 180, 90d supply, fill #0
  Filled 2021-02-23: qty 60, 30d supply, fill #0
  Filled 2021-03-23: qty 60, 30d supply, fill #1
  Filled 2021-04-20: qty 60, 30d supply, fill #2

## 2021-02-05 NOTE — Progress Notes (Signed)
10Patient is here to est care. Patient will need some refills on medication.

## 2021-02-05 NOTE — Progress Notes (Signed)
New Patient Office Visit  Subjective:  Patient ID: Chad Wolfe, male    DOB: May 06, 1966  Age: 55 y.o. MRN: [id number]  CC:  Chief Complaint  Patient presents with   Establish Care   Diabetes    HPI [name] presents for to establish care. Patient was in prison and has not had care since June. Patient was recently dx with diabetes. He was hospitalized with DKA. He is presently doing pretty good as he has stopped smoking and drinking. He reports that he also is starting evaluation and treatment for hepatitis c.   Past Medical History:  Diagnosis Date   Diabetes mellitus type 2 in nonobese (HCC) 01/21/2016   Gastric ulcer    Hepatitis C    Hyperlipidemia    Hypertension    Substance abuse (HCC)     Past Surgical History:  Procedure Laterality Date   I & D EXTREMITY Right 02/26/2017   Procedure: IRRIGATION AND DEBRIDEMENT AND REDUCTION OF RIGHT MIDDLE FINGER;  Surgeon: Dominica Severin, MD;  Location: [facility] OR;  Service: Orthopedics;  Laterality: Right;   STOMACH SURGERY      Family History  Problem Relation Age of Onset   Diabetes Father    Hypertension Father    Diabetes Sister    Hypertension Sister    Diabetes Brother    Hypertension Brother     Social History   Socioeconomic History   Marital status: Divorced    Spouse name: Not on file   Number of children: Not on file   Years of education: Not on file   Highest education level: Not on file  Occupational History   Not on file  Tobacco Use   Smoking status: Never   Smokeless tobacco: Never  Substance and Sexual Activity   Alcohol use: Yes    Alcohol/week: 0.0 standard drinks    Comment: drank a quart of beer this morning PT DENIES 09/26/15   Drug use: No    Types: Cocaine, Oxycodone    Comment: oxycodone 2 days ago "my sister gave me one"; no cocaine use   Sexual activity: Not on file    Comment: opiates  Other Topics Concern    Not on file  Social History Narrative   Not on file   Social Determinants of Health   Financial Resource Strain: Not on file  Food Insecurity: Not on file  Transportation Needs: Not on file  Physical Activity: Not on file  Stress: Not on file  Social Connections: Not on file  Intimate Partner Violence: Not on file    ROS Review of Systems  All other systems reviewed and are negative.  Objective:   Today's Vitals: BP 110/73 (BP Location: Right Arm, Patient Position: Sitting, Cuff Size: Normal)   Pulse 89   Temp 98.3 F (36.8 C) (Oral)   Resp 16   Wt 157 lb 6.4 oz (71.4 kg)   BMI 23.24 kg/m   Physical Exam Vitals and nursing note reviewed.  Constitutional:      General: He is not in acute distress. Cardiovascular:     Rate and Rhythm: Normal rate and regular rhythm.  Pulmonary:     Effort: Pulmonary effort is normal.     Breath sounds: Normal breath sounds.  Abdominal:     Palpations: Abdomen is soft.     Tenderness: There is no abdominal tenderness.  Neurological:     General: No focal deficit present.     Mental Status: He is alert  and oriented to person, place, and time.    Assessment & Plan:    1. Type 2 diabetes mellitus with other specified complication, without long-term current use of insulin (HCC) Continue present management and will monitor - glipiZIDE (GLUCOTROL) 5 MG tablet; Take 1 tablet (5 mg total) by mouth 2 (two) times daily.  Dispense: 180 tablet; Refill: 0 - gabapentin (NEURONTIN) 300 MG capsule; Take 1 capsule (300 mg total) by mouth 3 (three) times daily.  Dispense: 90 capsule; Refill: 3  2. Primary hypertension Meds refilled. Continue present management - lisinopril (ZESTRIL) 5 MG tablet; Take 1 tablet (5 mg total) by mouth daily.  Dispense: 90 tablet; Refill: 0  3. Stopped drinking alcohol Encouraged continued cessation  4. Stopped smoking between 1 and 3 months ago Encouraged continued cessation  5. Chronic hepatitis C without  hepatic coma (HCC) Management as per consultant  6. Encounter to establish care     Outpatient Encounter Medications as of 02/05/2021  Medication Sig   folic acid (FOLVITE) 1 MG tablet Take 1 tablet (1 mg total) by mouth daily.   gabapentin (NEURONTIN) 300 MG capsule Take 1 capsule (300 mg total) by mouth 3 (three) times daily.   glipiZIDE (GLUCOTROL) 5 MG tablet Take 1 tablet (5 mg total) by mouth 2 (two) times daily.   glucose blood (TRUE METRIX BLOOD GLUCOSE TEST) test strip Use as instructed   lisinopril (ZESTRIL) 5 MG tablet Take 1 tablet (5 mg total) by mouth daily.   Omega-3 Fatty Acids (FISH OIL) 1000 MG CAPS Take by mouth.   thiamine 100 MG tablet Take 1 tablet (100 mg total) by mouth daily.   TRUEplus Lancets 28G MISC use as directed   potassium chloride SA (KLOR-CON) 20 MEQ tablet Take 1 tablet (20 mEq total) by mouth daily. (Patient not taking: No sig reported)   [DISCONTINUED] cyanocobalamin 1000 MCG tablet Take 1,000 mcg by mouth daily. (Patient not taking: Reported on 02/04/2021)   No facility-administered encounter medications on file as of 02/05/2021.    Follow-up: Return in about 3 months (around 05/07/2021) for follow up.   [name], MD

## 2021-02-06 ENCOUNTER — Other Ambulatory Visit: Payer: Self-pay

## 2021-02-06 ENCOUNTER — Telehealth: Payer: Self-pay

## 2021-02-06 NOTE — Telephone Encounter (Signed)
[facility] Patient Advocate Encounter  Completed and sent Support Path application for Epclusa for this patient who is uninsured.    Patient assistance phone number for follow up is [phone].   This encounter will be updated until final determination.   [name], CPhT Specialty Pharmacy Patient Advocate [facility] Phone: [phone] Fax:  [phone]  

## 2021-02-10 ENCOUNTER — Telehealth: Payer: Self-pay

## 2021-02-10 LAB — PROTIME-INR
INR: 1
Prothrombin Time: 9.8 s (ref 9.0–11.5)

## 2021-02-10 LAB — LIVER FIBROSIS, FIBROTEST-ACTITEST
ALT: 33 U/L (ref 9–46)
Alpha-2-Macroglobulin: 365 mg/dL — ABNORMAL HIGH (ref 106–279)
Apolipoprotein A1: 148 mg/dL (ref 94–176)
Bilirubin: 0.5 mg/dL (ref 0.2–1.2)
Fibrosis Score: 0.78
GGT: 99 U/L — ABNORMAL HIGH (ref 3–85)
Haptoglobin: 42 mg/dL — ABNORMAL LOW (ref 43–212)
Necroinflammat ACT Score: 0.3
Reference ID: 4013681

## 2021-02-10 LAB — HEPATITIS A ANTIBODY, TOTAL: Hepatitis A AB,Total: REACTIVE — AB

## 2021-02-10 LAB — HEPATITIS C GENOTYPE

## 2021-02-10 LAB — HEPATITIS B SURFACE ANTIBODY,QUALITATIVE: Hep B S Ab: NONREACTIVE

## 2021-02-10 LAB — HEPATITIS B SURFACE ANTIGEN: Hepatitis B Surface Ag: NONREACTIVE

## 2021-02-10 NOTE — Telephone Encounter (Signed)
[facility] Patient Advocate Encounter  Completed and sent Support Path application for Epclusa for this patient who is uninsured.    Patient is approved 02/10/21 through 05/05/21.  Medication will come from [facility].  Once I have a delivery date I will call the patient and have him to come pick up medication and set up a 1 month follow up.   Clearance Coots, CPhT Specialty Pharmacy Patient [facility] Phone: [phone] Fax:  [phone]

## 2021-02-12 ENCOUNTER — Other Ambulatory Visit: Payer: Self-pay

## 2021-02-16 ENCOUNTER — Telehealth: Payer: Self-pay

## 2021-02-16 NOTE — Telephone Encounter (Signed)
[facility] Patient Advocate Encounter  Patient's medications have been couriered to [facility] from [facility] and will be picked up 02/18/21.  Vere Diantonio , CPhT Specialty Pharmacy Patient Advocate [facility] Phone: [phone] Fax:  [phone]  

## 2021-02-18 ENCOUNTER — Telehealth: Payer: Self-pay | Admitting: Pharmacist

## 2021-02-18 ENCOUNTER — Other Ambulatory Visit: Payer: Self-pay | Admitting: Pharmacist

## 2021-02-18 DIAGNOSIS — B182 Chronic viral hepatitis C: Secondary | ICD-10-CM

## 2021-02-18 MED ORDER — SOFOSBUVIR-VELPATASVIR 400-100 MG PO TABS: 1.0000 | ORAL_TABLET | Freq: Every day | ORAL | 2 refills | Status: DC

## 2021-02-18 NOTE — Telephone Encounter (Signed)
Called Mr. Dungan regarding Chad Wolfe medication information. Informed him of side effects including fatigue and headache, he may take with or without food, let him know there were no drug interactions with his current medications and confirmed he was not taking any acid suppressants. The patient voiced understanding regarding side effects and administration, as well as the importance of adherence for full 12 week course. Patient confirmed he was aware of his next scheduled appointment with Judeth Cornfield on October 17. Patient stated he would be taking his Epclusa every evening after work with a meal.

## 2021-02-23 ENCOUNTER — Other Ambulatory Visit: Payer: Self-pay

## 2021-03-04 ENCOUNTER — Telehealth: Payer: Self-pay

## 2021-03-04 NOTE — Telephone Encounter (Signed)
[facility] Patient Advocate Encounter ° °Patient's medications have been couriered to [facility] from [facility] and will be picked up . ° °Reagen Haberman , CPhT °Specialty Pharmacy Patient Advocate °[facility] °Phone: [phone] °Fax:  [phone]  °

## 2021-03-06 ENCOUNTER — Inpatient Hospital Stay (INDEPENDENT_AMBULATORY_CARE_PROVIDER_SITE_OTHER): Payer: No Typology Code available for payment source | Admitting: Primary Care

## 2021-03-19 ENCOUNTER — Encounter: Payer: Self-pay | Admitting: Family Medicine

## 2021-03-19 ENCOUNTER — Other Ambulatory Visit: Payer: Self-pay

## 2021-03-19 ENCOUNTER — Ambulatory Visit (INDEPENDENT_AMBULATORY_CARE_PROVIDER_SITE_OTHER): Payer: Self-pay | Admitting: Family Medicine

## 2021-03-19 VITALS — BP 125/74 | HR 83 | Temp 98.1°F | Resp 16 | Wt 158.8 lb

## 2021-03-19 DIAGNOSIS — E1142 Type 2 diabetes mellitus with diabetic polyneuropathy: Secondary | ICD-10-CM

## 2021-03-19 MED ORDER — GABAPENTIN 600 MG PO TABS
600.0000 mg | ORAL_TABLET | Freq: Three times a day (TID) | ORAL | 1 refills | Status: DC
  Filled 2021-03-19: qty 90, 30d supply, fill #0
  Filled 2021-04-20: qty 90, 30d supply, fill #1

## 2021-03-19 NOTE — Progress Notes (Signed)
Patient is here with c/o feet pain since August 2022. Patient said that the medication is not working and pain is 9/10

## 2021-03-20 ENCOUNTER — Encounter: Payer: Self-pay | Admitting: Family Medicine

## 2021-03-20 NOTE — Progress Notes (Signed)
Established  Patient Office Visit  Subjective:  Patient ID: Chad Wolfe, male    DOB: 1966-05-25  Age: 55 y.o. MRN: [id number]  CC:  Chief Complaint  Patient presents with   Diabetes   Foot Pain    HPI Chad Wolfe presents for complaint of burning in his feet. Present management provides only minimal relief. Patient recently found out that he was diabetic.   Past Medical History:  Diagnosis Date   Diabetes mellitus type 2 in nonobese (HCC) 01/21/2016   Gastric ulcer    Hepatitis C    Hyperlipidemia    Hypertension    Substance abuse (HCC)       Social History   Socioeconomic History   Marital status: Divorced    Spouse name: Not on file   Number of children: Not on file   Years of education: Not on file   Highest education level: Not on file  Occupational History   Not on file  Tobacco Use   Smoking status: Never   Smokeless tobacco: Never  Substance and Sexual Activity   Alcohol use: Yes    Alcohol/week: 0.0 standard drinks    Comment: drank a quart of beer this morning PT DENIES 09/26/15   Drug use: No    Types: Cocaine, Oxycodone    Comment: oxycodone 2 days ago "my sister gave me one"; no cocaine use   Sexual activity: Not on file    Comment: opiates  Other Topics Concern   Not on file  Social History Narrative   Not on file   Social Determinants of Health   Financial Resource Strain: Not on file  Food Insecurity: Not on file  Transportation Needs: Not on file  Physical Activity: Not on file  Stress: Not on file  Social Connections: Not on file  Intimate Partner Violence: Not on file    ROS Review of Systems  Musculoskeletal:  Negative for gait problem.  Neurological:  Positive for numbness.  All other systems reviewed and are negative.  Objective:   Today's Vitals: BP 125/74   Pulse 83   Temp 98.1 F (36.7 C) (Oral)   Resp 16   Wt 158 lb 12.8 oz (72 kg)   SpO2 95%   BMI 23.45 kg/m   Physical Exam Vitals and nursing  note reviewed.  Constitutional:      General: He is not in acute distress. Cardiovascular:     Rate and Rhythm: Normal rate and regular rhythm.  Pulmonary:     Effort: Pulmonary effort is normal.     Breath sounds: Normal breath sounds.  Neurological:     General: No focal deficit present.     Mental Status: He is alert and oriented to person, place, and time.     Gait: Gait abnormal (2/2 symptoms).    Assessment & Plan:   1. Diabetic polyneuropathy associated with type 2 diabetes mellitus (HCC) Neurontin increased from 300mg  tid to 600mg  tid. Patient referred to pain management for further eval/mgt - Ambulatory referral to Pain Clinic    Outpatient Encounter Medications as of 03/19/2021  Medication Sig   folic acid (FOLVITE) 1 MG tablet Take 1 tablet (1 mg total) by mouth daily.   gabapentin (NEURONTIN) 600 MG tablet Take 1 tablet (600 mg total) by mouth 3 (three) times daily.   glipiZIDE (GLUCOTROL) 5 MG tablet Take 1 tablet (5 mg total) by mouth 2 (two) times daily.   glucose blood (TRUE METRIX BLOOD GLUCOSE TEST) test  strip Use as instructed   lisinopril (ZESTRIL) 5 MG tablet Take 1 tablet (5 mg total) by mouth daily.   Omega-3 Fatty Acids (FISH OIL) 1000 MG CAPS Take by mouth.   potassium chloride SA (KLOR-CON) 20 MEQ tablet Take 1 tablet (20 mEq total) by mouth daily.   Sofosbuvir-Velpatasvir (EPCLUSA) 400-100 MG TABS Take 1 tablet by mouth daily.   thiamine 100 MG tablet Take 1 tablet (100 mg total) by mouth daily.   TRUEplus Lancets 28G MISC use as directed   [DISCONTINUED] gabapentin (NEURONTIN) 300 MG capsule Take 1 capsule (300 mg total) by mouth 3 (three) times daily.   No facility-administered encounter medications on file as of 03/19/2021.    Follow-up: No follow-ups on file.   [name], MD

## 2021-03-23 ENCOUNTER — Ambulatory Visit: Payer: Self-pay | Admitting: Infectious Diseases

## 2021-03-23 ENCOUNTER — Other Ambulatory Visit: Payer: Self-pay

## 2021-03-24 ENCOUNTER — Other Ambulatory Visit: Payer: Self-pay

## 2021-03-25 ENCOUNTER — Telehealth: Payer: Self-pay

## 2021-03-25 NOTE — Telephone Encounter (Signed)
[facility] Patient Advocate Encounter  Patient's medications have been couriered to [facility] from [facility] and will be picked up 03/30/21.  Clearance Coots , CPhT Specialty Pharmacy Patient [facility] Phone: [phone] Fax:  [phone]

## 2021-03-30 ENCOUNTER — Ambulatory Visit: Payer: Self-pay | Admitting: Infectious Diseases

## 2021-04-02 ENCOUNTER — Ambulatory Visit: Payer: Self-pay | Admitting: Infectious Diseases

## 2021-04-20 ENCOUNTER — Other Ambulatory Visit: Payer: Self-pay

## 2021-05-07 ENCOUNTER — Other Ambulatory Visit: Payer: Self-pay

## 2021-05-07 ENCOUNTER — Encounter: Payer: Self-pay | Admitting: Family Medicine

## 2021-05-07 ENCOUNTER — Ambulatory Visit (INDEPENDENT_AMBULATORY_CARE_PROVIDER_SITE_OTHER): Payer: Self-pay | Admitting: Family Medicine

## 2021-05-07 VITALS — BP 128/88 | HR 82 | Temp 97.9°F | Resp 16 | Wt 160.0 lb

## 2021-05-07 DIAGNOSIS — E1142 Type 2 diabetes mellitus with diabetic polyneuropathy: Secondary | ICD-10-CM

## 2021-05-07 LAB — POCT GLYCOSYLATED HEMOGLOBIN (HGB A1C): Hemoglobin A1C: 6.5 % — AB (ref 4.0–5.6)

## 2021-05-07 MED ORDER — PREGABALIN 75 MG PO CAPS: 75.0000 mg | ORAL_CAPSULE | Freq: Two times a day (BID) | ORAL | 0 refills | Status: DC

## 2021-05-07 NOTE — Progress Notes (Signed)
Patient is still in pain from his feet. Patient pain is 10/10 and need something for pain

## 2021-05-07 NOTE — Progress Notes (Signed)
Established Patient Office Visit  Subjective:  Patient ID: Chad Wolfe, male    DOB: 1966-01-02  Age: 55 y.o. MRN: [id number]  CC:  Chief Complaint  Patient presents with   Follow-up   Diabetes    HPI [name] presents for follow up of diabetes. Patient reports that the neurontin has not seemed to provide adequate relief of the numbness, tingling and pain  in his feet. He has taken someone else's Lyrica and reports that this provided good relief of his symptoms.   Past Medical History:  Diagnosis Date   Diabetes mellitus type 2 in nonobese (HCC) 01/21/2016   Gastric ulcer    Hepatitis C    Hyperlipidemia    Hypertension    Substance abuse (HCC)     Past Surgical History:  Procedure Laterality Date   I & D EXTREMITY Right 02/26/2017   Procedure: IRRIGATION AND DEBRIDEMENT AND REDUCTION OF RIGHT MIDDLE FINGER;  Surgeon: Dominica Severin, MD;  Location: [facility] OR;  Service: Orthopedics;  Laterality: Right;   STOMACH SURGERY      Family History  Problem Relation Age of Onset   Diabetes Father    Hypertension Father    Diabetes Sister    Hypertension Sister    Diabetes Brother    Hypertension Brother     Social History   Socioeconomic History   Marital status: Divorced    Spouse name: Not on file   Number of children: Not on file   Years of education: Not on file   Highest education level: Not on file  Occupational History   Not on file  Tobacco Use   Smoking status: Never   Smokeless tobacco: Never  Substance and Sexual Activity   Alcohol use: Yes    Alcohol/week: 0.0 standard drinks    Comment: drank a quart of beer this morning PT DENIES 09/26/15   Drug use: No    Types: Cocaine, Oxycodone    Comment: oxycodone 2 days ago "my sister gave me one"; no cocaine use   Sexual activity: Not on file    Comment: opiates  Other Topics Concern   Not on file  Social History Narrative   Not on file   Social Determinants of Health   Financial Resource  Strain: Not on file  Food Insecurity: Not on file  Transportation Needs: Not on file  Physical Activity: Not on file  Stress: Not on file  Social Connections: Not on file  Intimate Partner Violence: Not on file    ROS Review of Systems  All other systems reviewed and are negative.  Objective:   Today's Vitals: BP 128/88   Pulse 82   Temp 97.9 F (36.6 C) (Oral)   Resp 16   Wt 160 lb (72.6 kg)   BMI 23.63 kg/m   Physical Exam Vitals and nursing note reviewed.  Constitutional:      General: He is not in acute distress. Cardiovascular:     Rate and Rhythm: Normal rate and regular rhythm.  Pulmonary:     Effort: Pulmonary effort is normal.     Breath sounds: Normal breath sounds.  Neurological:     General: No focal deficit present.     Mental Status: He is alert and oriented to person, place, and time.    Assessment & Plan:   1. Type 2 diabetes mellitus with diabetic polyneuropathy, without long-term current use of insulin (HCC) A1c much improved and now at goal.  Patient prescribed Lyrica 75  mg bid for sx.  - POCT glycosylated hemoglobin (Hb A1C)    Outpatient Encounter Medications as of 05/07/2021  Medication Sig   folic acid (FOLVITE) 1 MG tablet Take 1 tablet (1 mg total) by mouth daily.   gabapentin (NEURONTIN) 600 MG tablet Take 1 tablet (600 mg total) by mouth 3 (three) times daily.   glipiZIDE (GLUCOTROL) 5 MG tablet Take 1 tablet (5 mg total) by mouth 2 (two) times daily.   glucose blood (TRUE METRIX BLOOD GLUCOSE TEST) test strip Use as instructed   lisinopril (ZESTRIL) 5 MG tablet Take 1 tablet (5 mg total) by mouth daily.   Omega-3 Fatty Acids (FISH OIL) 1000 MG CAPS Take by mouth.   potassium chloride SA (KLOR-CON) 20 MEQ tablet Take 1 tablet (20 mEq total) by mouth daily.   pregabalin (LYRICA) 75 MG capsule Take 1 capsule (75 mg total) by mouth 2 (two) times daily.   Sofosbuvir-Velpatasvir (EPCLUSA) 400-100 MG TABS Take 1 tablet by mouth daily.    thiamine 100 MG tablet Take 1 tablet (100 mg total) by mouth daily.   TRUEplus Lancets 28G MISC use as directed   No facility-administered encounter medications on file as of 05/07/2021.    Follow-up: No follow-ups on file.   Chad Raymond, MD

## 2021-05-11 ENCOUNTER — Other Ambulatory Visit: Payer: Self-pay

## 2021-05-11 ENCOUNTER — Telehealth: Payer: Self-pay | Admitting: Family Medicine

## 2021-05-11 ENCOUNTER — Other Ambulatory Visit: Payer: Self-pay | Admitting: *Deleted

## 2021-05-11 DIAGNOSIS — E1169 Type 2 diabetes mellitus with other specified complication: Secondary | ICD-10-CM

## 2021-05-11 DIAGNOSIS — I10 Essential (primary) hypertension: Secondary | ICD-10-CM

## 2021-05-11 MED ORDER — GLIPIZIDE 5 MG PO TABS: 5.0000 mg | ORAL_TABLET | Freq: Two times a day (BID) | ORAL | 0 refills | Status: DC

## 2021-05-11 MED ORDER — GLIPIZIDE 5 MG PO TABS
5.0000 mg | ORAL_TABLET | Freq: Two times a day (BID) | ORAL | 0 refills | Status: DC
  Filled 2021-05-11: qty 180, 90d supply, fill #0
  Filled 2021-05-20 – 2021-06-24 (×2): qty 60, 30d supply, fill #0
  Filled 2021-07-27: qty 60, 30d supply, fill #1

## 2021-05-11 MED ORDER — GABAPENTIN 600 MG PO TABS
600.0000 mg | ORAL_TABLET | Freq: Three times a day (TID) | ORAL | 1 refills | Status: DC
  Filled 2021-05-11 – 2021-05-20 (×2): qty 90, 30d supply, fill #0

## 2021-05-11 MED ORDER — GABAPENTIN 600 MG PO TABS: 600.0000 mg | ORAL_TABLET | Freq: Three times a day (TID) | ORAL | 1 refills | Status: DC

## 2021-05-11 MED ORDER — LISINOPRIL 5 MG PO TABS
5.0000 mg | ORAL_TABLET | Freq: Every day | ORAL | 0 refills | Status: DC
  Filled 2021-05-11: qty 90, 90d supply, fill #0
  Filled 2021-05-20 – 2021-06-24 (×2): qty 30, 30d supply, fill #0
  Filled 2021-07-27: qty 30, 30d supply, fill #1

## 2021-05-11 MED ORDER — LISINOPRIL 5 MG PO TABS: 5.0000 mg | ORAL_TABLET | Freq: Every day | ORAL | 0 refills | Status: DC

## 2021-05-11 NOTE — Telephone Encounter (Signed)
Pt wants to know when his next appt is. Pt states he was not told during his visit and since patient walked out and admits not checking out no follow up appt was scheduled. Pt wants to know when Dr. Andrey Campanile wants to see him again.   Pt also is asking for med refills for 3 MEDICATIONS:  1) "My sugar pill" pt doesn't recall the name but states it should be on my chart.   2)Rx #: [id number]  lisinopril (ZESTRIL) 5 MG tablet [[id number]]  3) Rx #: [id number]  gabapentin (NEURONTIN) 600 MG tablet [[id number]]   Pharmacy  [id number] - [place] ([place]), [place] - [address]  [address], [place] ([place]) [place] [address]

## 2021-05-11 NOTE — Telephone Encounter (Signed)
Pt returned Nurse's call regarding refill. Pt states he wants meds now sent to [facility]. [facility] was a mistake and needs it to be [facility]. Thank you .

## 2021-05-11 NOTE — Telephone Encounter (Signed)
Refills have been sent  and follow-up appt has been made

## 2021-05-13 ENCOUNTER — Telehealth: Payer: Self-pay

## 2021-05-13 NOTE — Telephone Encounter (Signed)
Patient called for refill of Epclusa

## 2021-05-14 NOTE — Telephone Encounter (Signed)
Spoke with patient to verify he had received the medicaiton regimen necessary for treatment.  He was grateful and will be in clinic for his appointment on 12/12/

## 2021-05-18 ENCOUNTER — Ambulatory Visit: Payer: Self-pay | Admitting: Infectious Diseases

## 2021-05-20 ENCOUNTER — Other Ambulatory Visit: Payer: Self-pay

## 2021-06-03 NOTE — Progress Notes (Signed)
Patient ID: Chad Wolfe, male    DOB: 08/15/1965  MRN: [id number]  CC: Foot Pain Follow-Up   Subjective: Chad Wolfe is a 55 y.o. male who presents for foot pain follow-up.   His concerns today include:  Recent appointment on 05/07/2021 with [name], MD and prescribed Lyrica at that time. Today reports prescribed medication not helping. He took two 100 mg tablets for 1 day from his sister and did not help. Reports he was unable to be scheduled at the [facility] related to having no health insurance. Reports in the process of getting approved for [facility].    Patient Active Problem List   Diagnosis Date Noted   Uninsured 02/04/2021   Transaminitis 02/04/2021   BMI 22.0-22.9, adult 01/15/2021   DKA (diabetic ketoacidosis) (HCC) 01/05/2021   DKA, type 2 (HCC) 01/04/2021   Open finger dislocation 02/26/2017   Diabetes mellitus type 2 in nonobese (HCC) 01/21/2016   Fall from chair 05/15/2015   Alcohol abuse 05/05/2015   Pain, dental 03/27/2013   HTN (hypertension) 04/25/2012   Chronic hepatitis C virus infection (HCC) 11/15/2007   ABDOMINAL PAIN-EPIGASTRIC 11/15/2007     Current Outpatient Medications on File Prior to Visit  Medication Sig Dispense Refill   folic acid (FOLVITE) 1 MG tablet Take 1 tablet (1 mg total) by mouth daily. 90 tablet 1   gabapentin (NEURONTIN) 600 MG tablet Take 1 tablet (600 mg total) by mouth 3 (three) times daily. 90 tablet 1   glipiZIDE (GLUCOTROL) 5 MG tablet Take 1 tablet (5 mg total) by mouth 2 (two) times daily. 180 tablet 0   glucose blood (TRUE METRIX BLOOD GLUCOSE TEST) test strip Use as instructed 100 each 12   lisinopril (ZESTRIL) 5 MG tablet Take 1 tablet (5 mg total) by mouth daily. 90 tablet 0   Omega-3 Fatty Acids (FISH OIL) 1000 MG CAPS Take by mouth.     potassium chloride SA (KLOR-CON) 20 MEQ tablet Take 1 tablet (20 mEq total) by mouth daily. 7 tablet 0   Sofosbuvir-Velpatasvir (EPCLUSA) 400-100 MG  TABS Take 1 tablet by mouth daily. 30 tablet 2   thiamine 100 MG tablet Take 1 tablet (100 mg total) by mouth daily. 30 tablet 1   TRUEplus Lancets 28G MISC use as directed 100 each 12   No current facility-administered medications on file prior to visit.    Allergies  Allergen Reactions   Ibuprofen Other (See Comments)    Stomach upset   Aspirin Other (See Comments)   Other Other (See Comments)    MD told him not to take [facility] due to Hep C   Tylenol [Acetaminophen] Other (See Comments)    HEP C    Social History   Socioeconomic History   Marital status: Divorced    Spouse name: Not on file   Number of children: Not on file   Years of education: Not on file   Highest education level: Not on file  Occupational History   Not on file  Tobacco Use   Smoking status: Never   Smokeless tobacco: Never  Substance and Sexual Activity   Alcohol use: Yes    Alcohol/week: 0.0 standard drinks    Comment: drank a quart of beer this morning PT DENIES 09/26/15   Drug use: No    Types: Cocaine, Oxycodone    Comment: oxycodone 2 days ago "my sister gave me one"; no cocaine use   Sexual activity: Not on file  Comment: opiates  Other Topics Concern   Not on file  Social History Narrative   Not on file   Social Determinants of Health   Financial Resource Strain: Not on file  Food Insecurity: Not on file  Transportation Needs: Not on file  Physical Activity: Not on file  Stress: Not on file  Social Connections: Not on file  Intimate Partner Violence: Not on file    Family History  Problem Relation Age of Onset   Diabetes Father    Hypertension Father    Diabetes Sister    Hypertension Sister    Diabetes Brother    Hypertension Brother     Past Surgical History:  Procedure Laterality Date   I & D EXTREMITY Right 02/26/2017   Procedure: IRRIGATION AND DEBRIDEMENT AND REDUCTION OF RIGHT MIDDLE FINGER;  Surgeon: Dominica Severin, MD;  Location: [facility] OR;  Service: Orthopedics;   Laterality: Right;   STOMACH SURGERY      ROS: Review of Systems Negative except as stated above  PHYSICAL EXAM: BP 132/83 (BP Location: Left Arm, Patient Position: Sitting, Cuff Size: Normal)    Pulse 80    Temp 98.3 F (36.8 C)    Resp 18    Ht 5\' 6"  (1.676 m)    Wt 160 lb (72.6 kg)    SpO2 97%    BMI 25.82 kg/m   Physical Exam HENT:     Head: Normocephalic and atraumatic.  Eyes:     Extraocular Movements: Extraocular movements intact.     Conjunctiva/sclera: Conjunctivae normal.     Pupils: Pupils are equal, round, and reactive to light.  Cardiovascular:     Rate and Rhythm: Normal rate and regular rhythm.     Pulses: Normal pulses.     Heart sounds: Normal heart sounds.  Pulmonary:     Effort: Pulmonary effort is normal.     Breath sounds: Normal breath sounds.  Musculoskeletal:     Cervical back: Normal range of motion and neck supple.  Neurological:     General: No focal deficit present.     Mental Status: He is alert and oriented to person, place, and time.  Psychiatric:        Mood and Affect: Mood normal.        Behavior: Behavior normal.    Diabetic foot exam was performed with the following findings:   Decreased sensation to touch with monofilament testing bilaterally (only able to feel forefeet bilateral). No deformities, no ulcerations bilaterally. Flaky skin of bilateral feet. Great toenails thickened bilaterally with generalized discoloration of all toenails. Dorsal pulses intact bilaterally.      ASSESSMENT AND PLAN: 1. Neuropathic pain of both feet: - Increase Pregabalin from 75 mg twice daily to 100 mg twice daily. - Follow-up with primary provider in 4 weeks or sooner if needed.  - pregabalin (LYRICA) 100 MG capsule; Take 1 capsule (100 mg total) by mouth 2 (two) times daily.  Dispense: 60 capsule; Refill: 0  2. Encounter for diabetic foot exam [facility]): - Completed during today's appointment.     Patient was given the opportunity to ask questions.   Patient verbalized understanding of the plan and was able to repeat key elements of the plan. Patient was given clear instructions to go to Emergency Department or return to medical center if  Requested Prescriptions   Signed Prescriptions Disp Refills   pregabalin (LYRICA) 100 MG capsule 60 capsule 0    Sig: Take 1 capsule (100 mg total) by  mouth 2 (two) times daily.    Return in 4 weeks (on 07/03/2021) for Follow-Up or next available [name], MD .  Rema Fendt, NP

## 2021-06-05 ENCOUNTER — Encounter: Payer: Self-pay | Admitting: Family

## 2021-06-05 ENCOUNTER — Encounter (INDEPENDENT_AMBULATORY_CARE_PROVIDER_SITE_OTHER): Payer: Self-pay

## 2021-06-05 ENCOUNTER — Ambulatory Visit (INDEPENDENT_AMBULATORY_CARE_PROVIDER_SITE_OTHER): Payer: Self-pay | Admitting: Family

## 2021-06-05 ENCOUNTER — Other Ambulatory Visit: Payer: Self-pay

## 2021-06-05 VITALS — BP 132/83 | HR 80 | Temp 98.3°F | Resp 18 | Ht 66.0 in | Wt 160.0 lb

## 2021-06-05 DIAGNOSIS — G5793 Unspecified mononeuropathy of bilateral lower limbs: Secondary | ICD-10-CM

## 2021-06-05 DIAGNOSIS — E119 Type 2 diabetes mellitus without complications: Secondary | ICD-10-CM

## 2021-06-05 MED ORDER — PREGABALIN 100 MG PO CAPS
100.0000 mg | ORAL_CAPSULE | Freq: Two times a day (BID) | ORAL | 0 refills | Status: DC
  Filled 2021-06-05: qty 60, 30d supply, fill #0

## 2021-06-05 NOTE — Patient Instructions (Signed)
Neuropathic Pain °Neuropathic pain is pain caused by damage to the nerves that are responsible for certain sensations in your body (sensory nerves). °Neuropathic pain can make you more sensitive to pain. Even a minor sensation can feel very painful. This is usually a long-term (chronic) condition that can be difficult to treat. The type of pain differs from person to person. It may: °Start suddenly (acute), or it may develop slowly and become chronic. °Come and go as damaged nerves heal, or it may stay at the same level for years. °Cause emotional distress, loss of sleep, and a lower quality of life. °What are the causes? °The most common cause of this condition is diabetes. Many other diseases and conditions can also cause neuropathic pain. Causes of neuropathic pain can be classified as: °Toxic. This is caused by medicines and chemicals. The most common causes of toxic neuropathic pain is damage from medicines that kill cancer cells (chemotherapy) or alcohol abuse. °Metabolic. This can be caused by: °Diabetes. °Lack of vitamins like B12. °Traumatic. Any injury that cuts, crushes, or stretches a nerve can cause damage and pain. °Compression-related. If a sensory nerve gets trapped or compressed for a long period of time, the blood supply to the nerve can be cut off. °Vascular. Many blood vessel diseases can cause neuropathic pain by decreasing blood supply and oxygen to nerves. °Autoimmune. This type of pain results from diseases in which the body's defense system (immune system) mistakenly attacks sensory nerves. Examples of autoimmune diseases that can cause neuropathic pain include lupus and multiple sclerosis. °Infectious. Many types of viral infections can damage sensory nerves and cause pain. Shingles infection is a common cause of this type of pain. °Inherited. Neuropathic pain can be a symptom of many diseases that are passed down through families (genetic). °What increases the risk? °You are more likely to  develop this condition if: °You have diabetes. °You smoke. °You drink too much alcohol. °You are taking certain medicines, including chemotherapy or medicines that treat immune system disorders. °What are the signs or symptoms? °The main symptom is pain. Neuropathic pain is often described as: °Burning. °Shock-like. °Stinging. °Hot or cold. °Itching. °How is this diagnosed? °No single test can diagnose neuropathic pain. It is diagnosed based on: °A physical exam and your symptoms. Your health care provider will ask you about your pain. You may be asked to use a pain scale to describe how bad your pain is. °Tests. These may be done to see if you have a cause and location of any nerve damage. They include: °Nerve conduction studies and electromyography to test how well nerve signals travel through your nerves and muscles (electrodiagnostic testing). °Skin biopsy to evaluate for small fiber neuropathy. °Imaging studies, such as: °X-rays. °CT scan. °MRI. °How is this treated? °Treatment for neuropathic pain may change over time. You may need to try different treatment options or a combination of treatments. Some options include: °Treating the underlying cause of the neuropathy, such as diabetes, kidney disease, or vitamin deficiencies. °Stopping medicines that can cause neuropathy, such as chemotherapy. °Medicine to relieve pain. Medicines may include: °Prescription or over-the-counter pain medicine. °Anti-seizure medicine. °Antidepressant medicines. °Pain-relieving patches or creams that are applied to painful areas of skin. °A medicine to numb the area (local anesthetic), which can be injected as a nerve block. °Transcutaneous nerve stimulation. This uses electrical currents to block painful nerve signals. The treatment is painless. °Alternative treatments, such as: °Acupuncture. °Meditation. °Massage. °Occupational or physical therapy. °Pain management programs. °Counseling. °Follow   these instructions at  home: °Medicines ° °Take over-the-counter and prescription medicines only as told by your health care provider. °Ask your health care provider if the medicine prescribed to you: °Requires you to avoid driving or using machinery. °Can cause constipation. You may need to take these actions to prevent or treat constipation: °Drink enough fluid to keep your urine pale yellow. °Take over-the-counter or prescription medicines. °Eat foods that are high in fiber, such as beans, whole grains, and fresh fruits and vegetables. °Limit foods that are high in fat and processed sugars, such as fried or sweet foods. °Lifestyle ° °Have a good support system at home. °Consider joining a chronic pain support group. °Do not use any products that contain nicotine or tobacco. These products include cigarettes, chewing tobacco, and vaping devices, such as e-cigarettes. If you need help quitting, ask your health care provider. °Do not drink alcohol. °General instructions °Learn as much as you can about your condition. °Work closely with all your health care providers to find the treatment plan that works best for you. °Ask your health care provider what activities are safe for you. °Keep all follow-up visits. This is important. °Contact a health care provider if: °Your pain treatments are not working. °You are having side effects from your medicines. °You are struggling with tiredness (fatigue), mood changes, depression, or anxiety. °Get help right away if: °You have thoughts of hurting yourself. °Get help right away if you feel like you may hurt yourself or others, or have thoughts about taking your own life. Go to your nearest emergency room or: °Call 911. °Call the National Suicide Prevention Lifeline at 1-800-273-8255 or 988. This is open 24 hours a day. °Text the Crisis Text Line at 741741. °Summary °Neuropathic pain is pain caused by damage to the nerves that are responsible for certain sensations in your body (sensory  nerves). °Neuropathic pain may come and go as damaged nerves heal, or it may stay at the same level for years. °Neuropathic pain is usually a long-term condition that can be difficult to treat. Consider joining a chronic pain support group. °This information is not intended to replace advice given to you by your health care provider. Make sure you discuss any questions you have with your health care provider. °Document Revised: 01/19/2021 Document Reviewed: 01/19/2021 °Elsevier Patient Education © 2022 [employer] ° °

## 2021-06-05 NOTE — Progress Notes (Signed)
Pt presents for bilateral foot pain, states Lyrica prescribed on 12/1 is not working

## 2021-06-18 ENCOUNTER — Emergency Department (HOSPITAL_COMMUNITY)
Admission: EM | Admit: 2021-06-18 | Discharge: 2021-06-18 | Disposition: A | Discharge status: Home / Self Care | Payer: No Typology Code available for payment source | Attending: Emergency Medicine | Admitting: Emergency Medicine

## 2021-06-18 DIAGNOSIS — Z7984 Long term (current) use of oral hypoglycemic drugs: Secondary | ICD-10-CM | POA: Insufficient documentation

## 2021-06-18 DIAGNOSIS — G5793 Unspecified mononeuropathy of bilateral lower limbs: Secondary | ICD-10-CM

## 2021-06-18 DIAGNOSIS — Z79899 Other long term (current) drug therapy: Secondary | ICD-10-CM | POA: Insufficient documentation

## 2021-06-18 DIAGNOSIS — I10 Essential (primary) hypertension: Secondary | ICD-10-CM | POA: Insufficient documentation

## 2021-06-18 DIAGNOSIS — E1140 Type 2 diabetes mellitus with diabetic neuropathy, unspecified: Secondary | ICD-10-CM | POA: Insufficient documentation

## 2021-06-18 LAB — CBC WITH DIFFERENTIAL/PLATELET
Abs Immature Granulocytes: 0.01 10*3/uL (ref 0.00–0.07)
Basophils Absolute: 0.1 10*3/uL (ref 0.0–0.1)
Basophils Relative: 1 %
Eosinophils Absolute: 0.2 10*3/uL (ref 0.0–0.5)
Eosinophils Relative: 3 %
HCT: 44.8 % (ref 39.0–52.0)
Hemoglobin: 15.3 g/dL (ref 13.0–17.0)
Immature Granulocytes: 0 %
Lymphocytes Relative: 46 %
Lymphs Abs: 3.1 10*3/uL (ref 0.7–4.0)
MCH: 31.9 pg (ref 26.0–34.0)
MCHC: 34.2 g/dL (ref 30.0–36.0)
MCV: 93.3 fL (ref 80.0–100.0)
Monocytes Absolute: 0.7 10*3/uL (ref 0.1–1.0)
Monocytes Relative: 10 %
Neutro Abs: 2.6 10*3/uL (ref 1.7–7.7)
Neutrophils Relative %: 40 %
Platelets: 236 10*3/uL (ref 150–400)
RBC: 4.8 MIL/uL (ref 4.22–5.81)
RDW: 12.1 % (ref 11.5–15.5)
WBC: 6.6 10*3/uL (ref 4.0–10.5)
nRBC: 0 % (ref 0.0–0.2)

## 2021-06-18 LAB — ETHANOL: Alcohol, Ethyl (B): <10 mg/dL (ref <10)

## 2021-06-18 LAB — RAPID URINE DRUG SCREEN, HOSP PERFORMED
Amphetamines: NOT DETECTED
Barbiturates: NOT DETECTED
Benzodiazepines: NOT DETECTED
Cocaine: NOT DETECTED
Opiates: NOT DETECTED
Tetrahydrocannabinol: NOT DETECTED

## 2021-06-18 LAB — BASIC METABOLIC PANEL
Anion gap: 11 (ref 5–15)
BUN: 5 mg/dL — ABNORMAL LOW (ref 6–20)
CO2: 25 mmol/L (ref 22–32)
Calcium: 8.7 mg/dL — ABNORMAL LOW (ref 8.9–10.3)
Chloride: 103 mmol/L (ref 98–111)
Creatinine, Ser: 0.94 mg/dL (ref 0.61–1.24)
GFR, Estimated: >60 mL/min (ref >60)
Glucose, Bld: 186 mg/dL — ABNORMAL HIGH (ref 70–99)
Potassium: 3.5 mmol/L (ref 3.5–5.1)
Sodium: 139 mmol/L (ref 135–145)

## 2021-06-18 MED ORDER — PREGABALIN 100 MG PO CAPS: 100.0000 mg | ORAL_CAPSULE | Freq: Two times a day (BID) | ORAL | 1 refills | Status: DC

## 2021-06-18 MED ORDER — GABAPENTIN 300 MG PO CAPS
600.0000 mg | ORAL_CAPSULE | Freq: Once | ORAL | Status: AC
  Administered 2021-06-18: 600 mg via ORAL
  Filled 2021-06-18: qty 2

## 2021-06-18 MED ORDER — PREGABALIN 100 MG PO CAPS
100.0000 mg | ORAL_CAPSULE | Freq: Once | ORAL | Status: AC
  Administered 2021-06-18: 100 mg via ORAL
  Filled 2021-06-18: qty 1

## 2021-06-18 MED ORDER — LISINOPRIL 10 MG PO TABS
5.0000 mg | ORAL_TABLET | Freq: Once | ORAL | Status: AC
  Administered 2021-06-18: 5 mg via ORAL
  Filled 2021-06-18: qty 1

## 2021-06-18 MED ORDER — GABAPENTIN 600 MG PO TABS: 600.0000 mg | ORAL_TABLET | Freq: Three times a day (TID) | ORAL | 1 refills | Status: DC

## 2021-06-18 MED ORDER — GLIPIZIDE 5 MG PO TABS
5.0000 mg | ORAL_TABLET | Freq: Once | ORAL | Status: AC
  Administered 2021-06-18: 5 mg via ORAL
  Filled 2021-06-18: qty 1

## 2021-06-18 NOTE — ED Provider Notes (Signed)
[facility]   CSN: [id number] Arrival date & time: 06/18/21  [id number]     History  Chief Complaint  Patient presents with   Generalized Body Aches   Foot Pain   Hand Pain    Lynnea FerrierWilliam C Nicklin is a 56 y.o. male with a past medical history significant for diabetes, peripheral neuropathy, hypertension who presents with complaint of bilateral foot pain, general fatigue.  Patient reports that he has been unable to fill his gabapentin, Lyrica although he does currently have his diabetes and hypertension medications.  Patient reports because of his fatigue he is worried that his blood sugar is elevated.  Patient denies any increased frequency of urination, abdominal pain, nausea.  He denies any chest pain, shortness of breath, headache, blurry vision.   Foot Pain  Hand Pain      Home Medications Prior to Admission medications   Medication Sig Start Date End Date Taking? Authorizing Provider  folic acid (FOLVITE) 1 MG tablet Take 1 tablet (1 mg total) by mouth daily. 02/05/21   Georganna SkeansWilson, Amelia, MD  gabapentin (NEURONTIN) 600 MG tablet Take 1 tablet (600 mg total) by mouth 3 (three) times daily. 06/18/21   Elchanan Bob H, PA-C  glipiZIDE (GLUCOTROL) 5 MG tablet Take 1 tablet (5 mg total) by mouth 2 (two) times daily. 05/11/21 05/11/22  Georganna SkeansWilson, Amelia, MD  glucose blood (TRUE METRIX BLOOD GLUCOSE TEST) test strip Use as instructed 01/15/21   [name], [name], PA-C  lisinopril (ZESTRIL) 5 MG tablet Take 1 tablet (5 mg total) by mouth daily. 05/11/21   Georganna SkeansWilson, Amelia, MD  Omega-3 Fatty Acids (FISH OIL) 1000 MG CAPS Take by mouth.    [provider]  potassium chloride SA (KLOR-CON) 20 MEQ tablet Take 1 tablet (20 mEq total) by mouth daily. 01/06/21   Burnadette PopAdhikari, Amrit, MD  pregabalin (LYRICA) 100 MG capsule Take 1 capsule (100 mg total) by mouth 2 (two) times daily. 06/18/21 08/17/21  Siennah Barrasso H, PA-C  Sofosbuvir-Velpatasvir  (EPCLUSA) 400-100 MG TABS Take 1 tablet by mouth daily. 02/18/21   Jennette KettleWolfe, Amanda J, RPH-CPP  thiamine 100 MG tablet Take 1 tablet (100 mg total) by mouth daily. 01/15/21   [name], [name], PA-C  TRUEplus Lancets 28G MISC use as directed 01/15/21   [name], [name], PA-C      Allergies    Ibuprofen, Aspirin, Other, and Tylenol [acetaminophen]    Review of Systems   Review of Systems  Neurological:  Positive for numbness.  All other systems reviewed and are negative.  Physical Exam Updated Vital Signs BP (!) 160/102 (BP Location: Right Arm)    Pulse 72    Temp 98 F (36.7 C) (Oral)    Resp 16    SpO2 97%  Physical Exam Vitals and nursing note reviewed.  Constitutional:      General: He is not in acute distress.    Appearance: Normal appearance.  HENT:     Head: Normocephalic and atraumatic.  Eyes:     General:        Right eye: No discharge.        Left eye: No discharge.  Cardiovascular:     Rate and Rhythm: Normal rate and regular rhythm.     Heart sounds: No murmur heard.   No friction rub. No gallop.  Pulmonary:     Effort: Pulmonary effort is normal.     Breath sounds: Normal breath sounds.  Abdominal:  General: Bowel sounds are normal.     Palpations: Abdomen is soft.  Skin:    General: Skin is warm and dry.     Capillary Refill: Capillary refill takes less than 2 seconds.     Comments: No evidence of skin breakdown, ulcers on bilateral feet.  Patient is ambulatory.  He does have significant onychomycosis bilaterally, however there is no evidence of tinea pedis, or maceration of the skin.  He reports he can feel my touch although it is tingly, and significantly decreased sensation.  Neurological:     Mental Status: He is alert and oriented to person, place, and time.  Psychiatric:        Mood and Affect: Mood normal.        Behavior: Behavior normal.    ED Results / Procedures / Treatments   Labs (all labs ordered are listed, but only abnormal results are  displayed) Labs Reviewed  BASIC METABOLIC PANEL - Abnormal; Notable for the following components:      Result Value   Glucose, Bld 186 (*)    BUN 5 (*)    Calcium 8.7 (*)    All other components within normal limits  CBC WITH DIFFERENTIAL/PLATELET  RAPID URINE DRUG SCREEN, HOSP PERFORMED  ETHANOL    EKG None  Radiology No results found.  Procedures Procedures    Medications Ordered in ED Medications  lisinopril (ZESTRIL) tablet 5 mg (has no administration in time range)  pregabalin (LYRICA) capsule 100 mg (has no administration in time range)  gabapentin (NEURONTIN) capsule 600 mg (has no administration in time range)  glipiZIDE (GLUCOTROL) tablet 5 mg (has no administration in time range)    ED Course/ Medical Decision Making/ A&P                           Medical Decision Making  Is a well-appearing patient with past medical history of diabetes, hypertension, peripheral neuropathy who comes in with complaint of worsening peripheral neuropathy secondary to not being able to afford his medications.  Patient reports he is having difficulty with obtaining prescriptions for his gabapentin, Lyrica.  Patient reports that he has had no significant worsening of symptoms, he has just not been able to take the medication.  Patient reports that he has been taking his blood pressure and diabetes medications.  Patient denies any other symptoms at this time.  My differential includes peripheral neuropathy, and believe is important to rule out whether patient is ambulatory, or has any ulcers noted on the feet.  On physical exam patient does have sensory deficit of bilateral feet, however he is able to ambulate without difficulty, has no evidence of ulcers, or infection bilateral feet. I independently reviewed lab work including CBC, BMP, ethanol, UDS.  His BMP is significant for slightly elevated blood glucose of 186, with no elevation of creatinine, or evidence of acute kidney injury.  His  hyperglycemia is moderate, it appears that his diabetes is fairly adequately controlled at this time.  CBC without any abnormality.  Blood alcohol level normal, no evidence of drug use on UDS.  After discussion with patient it does appear that his primary concern today is picking up his medications, and progression of his peripheral neuropathy.  Discussed with patient we will administer single dose of his home medications, and I am happy to refill his prescription for Lyrica, gabapentin.  Discussed medication assistance with community health and wellness, as well as [email or web address].  Encouraged  follow-up with primary care doctor.  Patient discharged in stable condition at this time, return precautions given. Final Clinical Impression(s) / ED Diagnoses Final diagnoses:  Type 2 diabetes mellitus with diabetic neuropathy, without long-term current use of insulin (HCC)    Rx / DC Orders ED Discharge Orders          Ordered    gabapentin (NEURONTIN) 600 MG tablet  3 times daily        06/18/21 1753    pregabalin (LYRICA) 100 MG capsule  2 times daily        06/18/21 1753              Jaila Schellhorn, Edyth Gunnels 06/18/21 1854    Pollyann Savoy, MD 06/23/21 2037

## 2021-06-18 NOTE — ED Triage Notes (Signed)
Pt. Stated, I started having hand, foot, and body pain . Ive throw up blood. The person on the phone was saying I need to go to a treatment center????

## 2021-06-18 NOTE — ED Notes (Signed)
PT IS AMBULATORY

## 2021-06-18 NOTE — Discharge Instructions (Addendum)
As we discussed your lab work was unremarkable today, your blood sugar is slightly elevated around 180, but this is not critically high.  Please continue to try to take your home medications as prescribed.  Please follow-up with your primary care doctor.  Please continue to try to get into the prescription assistance program with [facility].  I have refilled your gabapentin, Lyrica, gabapentin is $4 with good Rx.   If he has any additional concerns about your symptoms worsening, or poor blood sugar control please return for further evaluation.  Otherwise follow-up with your primary care provider.

## 2021-06-18 NOTE — ED Notes (Signed)
Pt on the phone, person on the other line has thrown him out and he has nowhere to live now. Accused of stealing his drugs/medication???

## 2021-06-19 ENCOUNTER — Other Ambulatory Visit: Payer: Self-pay

## 2021-06-24 ENCOUNTER — Other Ambulatory Visit: Payer: Self-pay | Admitting: Family Medicine

## 2021-06-24 ENCOUNTER — Other Ambulatory Visit: Payer: Self-pay

## 2021-06-25 ENCOUNTER — Other Ambulatory Visit: Payer: Self-pay

## 2021-07-01 ENCOUNTER — Other Ambulatory Visit: Payer: Self-pay

## 2021-07-01 ENCOUNTER — Emergency Department (HOSPITAL_COMMUNITY)
Admission: EM | Admit: 2021-07-01 | Discharge: 2021-07-01 | Disposition: A | Discharge status: Home / Self Care | Payer: No Typology Code available for payment source | Attending: Emergency Medicine | Admitting: Emergency Medicine

## 2021-07-01 DIAGNOSIS — Z7984 Long term (current) use of oral hypoglycemic drugs: Secondary | ICD-10-CM | POA: Insufficient documentation

## 2021-07-01 DIAGNOSIS — M79673 Pain in unspecified foot: Secondary | ICD-10-CM | POA: Insufficient documentation

## 2021-07-01 DIAGNOSIS — E1140 Type 2 diabetes mellitus with diabetic neuropathy, unspecified: Secondary | ICD-10-CM | POA: Insufficient documentation

## 2021-07-01 DIAGNOSIS — Z760 Encounter for issue of repeat prescription: Secondary | ICD-10-CM | POA: Insufficient documentation

## 2021-07-01 DIAGNOSIS — Z79899 Other long term (current) drug therapy: Secondary | ICD-10-CM | POA: Insufficient documentation

## 2021-07-01 DIAGNOSIS — I10 Essential (primary) hypertension: Secondary | ICD-10-CM | POA: Insufficient documentation

## 2021-07-01 DIAGNOSIS — G5793 Unspecified mononeuropathy of bilateral lower limbs: Secondary | ICD-10-CM

## 2021-07-01 MED ORDER — PREGABALIN 100 MG PO CAPS
100.0000 mg | ORAL_CAPSULE | Freq: Two times a day (BID) | ORAL | 1 refills | Status: DC
  Filled 2021-07-01 – 2021-07-03 (×2): qty 60, 30d supply, fill #0

## 2021-07-01 MED ORDER — GABAPENTIN 600 MG PO TABS
600.0000 mg | ORAL_TABLET | Freq: Three times a day (TID) | ORAL | 1 refills | Status: DC
  Filled 2021-07-01: qty 90, 30d supply, fill #0
  Filled 2021-08-10: qty 90, 30d supply, fill #1

## 2021-07-01 MED ORDER — GABAPENTIN 600 MG PO TABS
600.0000 mg | ORAL_TABLET | Freq: Three times a day (TID) | ORAL | 1 refills | Status: DC
Start: 2021-07-01 — End: 2021-07-01

## 2021-07-01 MED ORDER — PREGABALIN 100 MG PO CAPS: 100.0000 mg | ORAL_CAPSULE | Freq: Two times a day (BID) | ORAL | 1 refills | Status: DC

## 2021-07-01 NOTE — ED Provider Notes (Addendum)
[facility] EMERGENCY DEPARTMENT Provider Note   CSN: [id number] Arrival date & time: 07/01/21  1243     History  Chief Complaint  Patient presents with   Medication Refill   Foot Pain    Chad Wolfe is a 56 y.o. male with history of diabetes, hypertension, peripheral neuropathy.  Patient was seen in this ED on 1/12 for medication refill.  Patient states since this time, he lost his prescription medication print out due to his sister washing it.  Patient is here complaining of foot pain that is most likely related to his neuropathy.  Patient denies any increased urination, back pain, abdominal pain, nausea, chest pain, shortness of breath, headache, blurry vision.   Medication Refill Foot Pain      Home Medications Prior to Admission medications   Medication Sig Start Date End Date Taking? Authorizing Provider  folic acid (FOLVITE) 1 MG tablet Take 1 tablet (1 mg total) by mouth daily. 02/05/21   [name], MD  gabapentin (NEURONTIN) 600 MG tablet Take 1 tablet (600 mg total) by mouth 3 (three) times daily. 07/01/21   [name], PA-C  glipiZIDE (GLUCOTROL) 5 MG tablet Take 1 tablet (5 mg total) by mouth 2 (two) times daily. 05/11/21 05/11/22  [name], MD  glucose blood (TRUE METRIX BLOOD GLUCOSE TEST) test strip Use as instructed 01/15/21   [name], [name], PA-C  lisinopril (ZESTRIL) 5 MG tablet Take 1 tablet (5 mg total) by mouth daily. 05/11/21   [name], MD  Omega-3 Fatty Acids (FISH OIL) 1000 MG CAPS Take by mouth.    [provider]  potassium chloride SA (KLOR-CON) 20 MEQ tablet Take 1 tablet (20 mEq total) by mouth daily. 01/06/21   [name], MD  pregabalin (LYRICA) 100 MG capsule Take 1 capsule (100 mg total) by mouth 2 (two) times daily. 07/01/21 08/30/21  [name], PA-C  Sofosbuvir-Velpatasvir (EPCLUSA) 400-100 MG TABS Take 1 tablet by mouth daily. 02/18/21   [name], RPH-CPP  thiamine 100 MG  tablet Take 1 tablet (100 mg total) by mouth daily. 01/15/21   [name], [name], PA-C  TRUEplus Lancets 28G MISC use as directed 01/15/21   [name], [name], PA-C      Allergies    Ibuprofen, Aspirin, Other, and Tylenol [acetaminophen]    Review of Systems   Review of Systems  Neurological:  Positive for numbness (Numbness to bilateral feet).  All other systems reviewed and are negative.  Physical Exam Updated Vital Signs BP (!) 163/100 (BP Location: Right Arm)    Pulse 63    Temp 97.6 F (36.4 C) (Oral)    Resp 16    SpO2 100%  Physical Exam Vitals and nursing note reviewed.  Constitutional:      General: He is not in acute distress.    Appearance: Normal appearance. He is normal weight. He is not ill-appearing or toxic-appearing.  HENT:     Head: Normocephalic and atraumatic.     Nose: Nose normal.     Mouth/Throat:     Mouth: Mucous membranes are moist.  Eyes:     Extraocular Movements: Extraocular movements intact.     Pupils: Pupils are equal, round, and reactive to light.  Cardiovascular:     Rate and Rhythm: Normal rate and regular rhythm.  Pulmonary:     Effort: Pulmonary effort is normal.     Breath sounds: Normal breath sounds.  Abdominal:     General:  Abdomen is flat.     Palpations: Abdomen is soft.     Tenderness: There is no abdominal tenderness.  Musculoskeletal:        General: Normal range of motion.     Cervical back: Normal range of motion and neck supple.  Skin:    General: Skin is warm and dry.     Capillary Refill: Capillary refill takes less than 2 seconds.     Comments: No evidence of skin breakdown, ulcers on bilateral feet.  Patient is ambulatory.  He does have significant onychomycosis bilaterally.  Patient states he is able to feel me touching his feet however he states that it is "tingly". No skin breakdown.  Neurological:     General: No focal deficit present.     Mental Status: He is alert and oriented to person, place, and time.  Psychiatric:         Mood and Affect: Mood normal.    ED Results / Procedures / Treatments   Labs (all labs ordered are listed, but only abnormal results are displayed) Labs Reviewed - No data to display  EKG None  Radiology No results found.  Procedures Procedures    Medications Ordered in ED Medications - No data to display  ED Course/ Medical Decision Making/ A&P                           Medical Decision Making Risk Prescription drug management.   Patient requesting medication refill of his pregabalin and Lyrica.  Patient seen in this ED on 06/18/2021 and prescribed medications by Luther Hearing PA-C.  Patient reports that he lost his prescription printouts in the wash machine.  Patient denies any medical issues, states that his foot pain is pre-existing and there has been no change in the quality or nature.  Patient is here only requesting medication refill.  I refilled the patient's medications.  I have sent them into the pharmacy as he has requested.  The patient is stable for discharge at this time.   Final Clinical Impression(s) / ED Diagnoses Final diagnoses:  Medication refill    Rx / DC Orders ED Discharge Orders          Ordered    gabapentin (NEURONTIN) 600 MG tablet  3 times daily,   Status:  Discontinued        07/01/21 1440    pregabalin (LYRICA) 100 MG capsule  2 times daily,   Status:  Discontinued        07/01/21 1440    gabapentin (NEURONTIN) 600 MG tablet  3 times daily        07/01/21 1444    pregabalin (LYRICA) 100 MG capsule  2 times daily        07/01/21 1444              [name], [place] 07/01/21 1447    Pollyann Savoy, MD 07/01/21 1539

## 2021-07-01 NOTE — Discharge Instructions (Signed)
Please pick up your prescription medication as we discussed

## 2021-07-01 NOTE — ED Notes (Signed)
RN reviewed discharge instructions, prescriptions reviewed. Pt had no further questions. 

## 2021-07-01 NOTE — ED Triage Notes (Signed)
Pt states he was here last week for bilateral foot pain, pt is diabetic, pt received prescriptions for medication for nerve pain, and prescription was accidentally washed w/ his pants. Pt states he has burning sensation in both feet

## 2021-07-02 ENCOUNTER — Other Ambulatory Visit: Payer: Self-pay

## 2021-07-03 ENCOUNTER — Other Ambulatory Visit: Payer: Self-pay

## 2021-07-13 ENCOUNTER — Ambulatory Visit (INDEPENDENT_AMBULATORY_CARE_PROVIDER_SITE_OTHER): Payer: Self-pay | Admitting: Family Medicine

## 2021-07-13 ENCOUNTER — Other Ambulatory Visit: Payer: Self-pay

## 2021-07-13 ENCOUNTER — Encounter: Payer: Self-pay | Admitting: Family Medicine

## 2021-07-13 VITALS — BP 110/74 | HR 92 | Resp 16 | Wt 165.0 lb

## 2021-07-13 DIAGNOSIS — G5793 Unspecified mononeuropathy of bilateral lower limbs: Secondary | ICD-10-CM

## 2021-07-13 DIAGNOSIS — E1169 Type 2 diabetes mellitus with other specified complication: Secondary | ICD-10-CM

## 2021-07-13 LAB — GLUCOSE, POCT (MANUAL RESULT ENTRY): POC Glucose: 69 mg/dl — AB (ref 70–99)

## 2021-07-13 MED ORDER — PREGABALIN 150 MG PO CAPS
150.0000 mg | ORAL_CAPSULE | Freq: Two times a day (BID) | ORAL | 0 refills | Status: DC
  Filled 2021-07-13 – 2021-07-27 (×2): qty 60, 30d supply, fill #0

## 2021-07-13 NOTE — Progress Notes (Signed)
Bilateral foot pain  Feels better when he walks

## 2021-07-14 ENCOUNTER — Other Ambulatory Visit: Payer: Self-pay

## 2021-07-15 ENCOUNTER — Encounter: Payer: Self-pay | Admitting: Family Medicine

## 2021-07-15 NOTE — Progress Notes (Signed)
New Patient Office Visit  Subjective:  Patient ID: Chad Wolfe, male    DOB: 1965-07-28  Age: 56 y.o. MRN: [id number]  CC:  Chief Complaint  Patient presents with   Diabetes   Foot Pain    HPI Chad Wolfe presents for follow up of diabetes. Patient reports bilateral foot pain. Denies known trauma or injury. Is not taking meds for sx.   Past Medical History:  Diagnosis Date   Diabetes mellitus type 2 in nonobese (HCC) 01/21/2016   Gastric ulcer    Hepatitis C    Hyperlipidemia    Hypertension    Substance abuse (HCC)     Past Surgical History:  Procedure Laterality Date   I & D EXTREMITY Right 02/26/2017   Procedure: IRRIGATION AND DEBRIDEMENT AND REDUCTION OF RIGHT MIDDLE FINGER;  Surgeon: Dominica Severin, MD;  Location: [facility] OR;  Service: Orthopedics;  Laterality: Right;   STOMACH SURGERY      Family History  Problem Relation Age of Onset   Diabetes Father    Hypertension Father    Diabetes Sister    Hypertension Sister    Diabetes Brother    Hypertension Brother     Social History   Socioeconomic History   Marital status: Divorced    Spouse name: Not on file   Number of children: Not on file   Years of education: Not on file   Highest education level: Not on file  Occupational History   Not on file  Tobacco Use   Smoking status: Never   Smokeless tobacco: Never  Substance and Sexual Activity   Alcohol use: Yes    Alcohol/week: 0.0 standard drinks    Comment: drank a quart of beer this morning PT DENIES 09/26/15   Drug use: No    Types: Cocaine, Oxycodone    Comment: oxycodone 2 days ago "my sister gave me one"; no cocaine use   Sexual activity: Not on file    Comment: opiates  Other Topics Concern   Not on file  Social History Narrative   Not on file   Social Determinants of Health   Financial Resource Strain: Not on file  Food Insecurity: Not on file  Transportation Needs: Not on file  Physical Activity: Not on file  Stress: Not  on file  Social Connections: Not on file  Intimate Partner Violence: Not on file    ROS Review of Systems  All other systems reviewed and are negative.  Objective:   Today's Vitals: BP 110/74 (BP Location: Right Arm, Patient Position: Sitting, Cuff Size: Normal)    Pulse 92    Resp 16    Wt 165 lb (74.8 kg)    SpO2 96%    BMI 26.63 kg/m   Physical Exam Vitals and nursing note reviewed.  Constitutional:      General: He is not in acute distress. Cardiovascular:     Rate and Rhythm: Normal rate and regular rhythm.  Pulmonary:     Effort: Pulmonary effort is normal.     Breath sounds: Normal breath sounds.  Abdominal:     Palpations: Abdomen is soft.     Tenderness: There is no abdominal tenderness.  Neurological:     General: No focal deficit present.     Mental Status: He is alert and oriented to person, place, and time.    Assessment & Plan:   1. Type 2 diabetes mellitus with other specified complication, without long-term current use of insulin (HCC) Continue  present management and monitor.  - Glucose (CBG)  2. Neuropathic pain of both feet Neurontin prescribed. Will monitor    Outpatient Encounter Medications as of 07/13/2021  Medication Sig   cholecalciferol (VITAMIN D3) 25 MCG (1000 UNIT) tablet Take 1,000 Units by mouth daily.   gabapentin (NEURONTIN) 600 MG tablet Take 1 tablet (600 mg total) by mouth 3 (three) times daily.   glipiZIDE (GLUCOTROL) 5 MG tablet Take 1 tablet (5 mg total) by mouth 2 (two) times daily.   lisinopril (ZESTRIL) 5 MG tablet Take 1 tablet (5 mg total) by mouth daily.   Omega-3 Fatty Acids (FISH OIL) 1000 MG CAPS Take by mouth.   pregabalin (LYRICA) 100 MG capsule Take 1 capsule (100 mg total) by mouth 2 (two) times daily.   pregabalin (LYRICA) 150 MG capsule Take 1 capsule (150 mg total) by mouth 2 (two) times daily.   folic acid (FOLVITE) 1 MG tablet Take 1 tablet (1 mg total) by mouth daily. (Patient not taking: Reported on 07/13/2021)    glucose blood (TRUE METRIX BLOOD GLUCOSE TEST) test strip Use as instructed (Patient not taking: Reported on 07/13/2021)   potassium chloride SA (KLOR-CON) 20 MEQ tablet Take 1 tablet (20 mEq total) by mouth daily. (Patient not taking: Reported on 07/13/2021)   Sofosbuvir-Velpatasvir (EPCLUSA) 400-100 MG TABS Take 1 tablet by mouth daily. (Patient not taking: Reported on 07/13/2021)   thiamine 100 MG tablet Take 1 tablet (100 mg total) by mouth daily. (Patient not taking: Reported on 07/13/2021)   TRUEplus Lancets 28G MISC use as directed (Patient not taking: Reported on 07/13/2021)   No facility-administered encounter medications on file as of 07/13/2021.    Follow-up: No follow-ups on file.   [name], MD

## 2021-07-21 ENCOUNTER — Encounter: Payer: No Typology Code available for payment source | Admitting: Physical Medicine and Rehabilitation

## 2021-07-21 ENCOUNTER — Other Ambulatory Visit: Payer: Self-pay

## 2021-07-27 ENCOUNTER — Other Ambulatory Visit: Payer: Self-pay

## 2021-08-10 ENCOUNTER — Other Ambulatory Visit: Payer: Self-pay

## 2021-08-21 ENCOUNTER — Other Ambulatory Visit: Payer: Self-pay | Admitting: Family Medicine

## 2021-08-21 ENCOUNTER — Other Ambulatory Visit: Payer: Self-pay

## 2021-08-21 DIAGNOSIS — E1169 Type 2 diabetes mellitus with other specified complication: Secondary | ICD-10-CM

## 2021-08-21 DIAGNOSIS — I10 Essential (primary) hypertension: Secondary | ICD-10-CM

## 2021-08-21 MED ORDER — GLIPIZIDE 5 MG PO TABS
5.0000 mg | ORAL_TABLET | Freq: Two times a day (BID) | ORAL | 0 refills | Status: DC
  Filled 2021-08-21: qty 60, 30d supply, fill #0
  Filled 2021-09-21: qty 60, 30d supply, fill #1
  Filled 2021-10-18: qty 60, 30d supply, fill #2

## 2021-08-21 NOTE — Telephone Encounter (Signed)
Pt called saying he is going out of town and will gone several weeks and will need his prescriptions before he leaves. ?[facility] ? ?

## 2021-08-21 NOTE — Telephone Encounter (Signed)
Patient called in to inform Dr Andrey Campanile that he is completely out of all medication and need refills sent to the pharmacy today so he can pick it up before they close. Please advise  ?

## 2021-08-24 ENCOUNTER — Other Ambulatory Visit: Payer: Self-pay | Admitting: Family Medicine

## 2021-08-24 ENCOUNTER — Ambulatory Visit: Payer: Self-pay | Admitting: *Deleted

## 2021-08-24 ENCOUNTER — Other Ambulatory Visit: Payer: Self-pay

## 2021-08-24 DIAGNOSIS — G5793 Unspecified mononeuropathy of bilateral lower limbs: Secondary | ICD-10-CM

## 2021-08-24 DIAGNOSIS — I10 Essential (primary) hypertension: Secondary | ICD-10-CM

## 2021-08-24 MED ORDER — LISINOPRIL 5 MG PO TABS
5.0000 mg | ORAL_TABLET | Freq: Every day | ORAL | 0 refills | Status: DC
  Filled 2021-08-24: qty 30, 30d supply, fill #0
  Filled 2021-09-21: qty 30, 30d supply, fill #1
  Filled 2021-10-18: qty 30, 30d supply, fill #2

## 2021-08-24 NOTE — Telephone Encounter (Signed)
Requested medication (s) are due for refill today: Yes ? ?Requested medication (s) are on the active medication list: Yes ? ?Last refill:  07/13/21 ? ?Future visit scheduled: Yes ? ?Notes to clinic:  Unable to refill per protocol, cannot delegate.  ? ? ? ? ?Requested Prescriptions  ?Pending Prescriptions Disp Refills  ? pregabalin (LYRICA) 150 MG capsule 60 capsule 0  ?  Sig: Take 1 capsule (150 mg total) by mouth 2 (two) times daily.  ?  ? Not Delegated - Neurology:  Anticonvulsants - Controlled - pregabalin Failed - 08/24/2021  1:21 PM  ?  ?  Failed - This refill cannot be delegated  ?  ?  Passed - Cr in normal range and within 360 days  ?  Creat  ?Date Value Ref Range Status  ?09/02/2014 0.90 0.50 - 1.35 mg/dL Final  ? ?Creatinine, Ser  ?Date Value Ref Range Status  ?06/18/2021 0.94 0.61 - 1.24 mg/dL Final  ?  ?  ?  ?  Passed - Completed PHQ-2 or PHQ-9 in the last 360 days  ?  ?  Passed - Valid encounter within last 12 months  ?  Recent Outpatient Visits   ? ?      ? 1 month ago Type 2 diabetes mellitus with other specified complication, without long-term current use of insulin (HCC)  ? Primary Care at [facility], Lauris Poag, MD  ? 2 months ago Neuropathic pain of both feet  ? Primary Care at [facility], Amy J, NP  ? 3 months ago Type 2 diabetes mellitus with diabetic polyneuropathy, without long-term current use of insulin (HCC)  ? Primary Care at [facility], MD  ? 5 months ago Diabetic polyneuropathy associated with type 2 diabetes mellitus (HCC)  ? Primary Care at [facility], MD  ? 6 months ago Diabetes mellitus type 2 in nonobese [facility])  ? Primary Care at [facility], MD  ? ?  ?  ?Future Appointments   ? ?        ? In 1 month [name], MD Primary Care at [facility]  ? ?  ? ?  ?  ?  ?Signed Prescriptions Disp Refills  ? lisinopril (ZESTRIL) 5 MG tablet 90 tablet 0  ?  Sig: Take 1 tablet (5 mg total) by mouth daily.  ?  ?  Cardiovascular:  ACE Inhibitors Passed - 08/24/2021  1:21 PM  ?  ?  Passed - Cr in normal range and within 180 days  ?  Creat  ?Date Value Ref Range Status  ?09/02/2014 0.90 0.50 - 1.35 mg/dL Final  ? ?Creatinine, Ser  ?Date Value Ref Range Status  ?06/18/2021 0.94 0.61 - 1.24 mg/dL Final  ?  ?  ?  ?  Passed - K in normal range and within 180 days  ?  Potassium  ?Date Value Ref Range Status  ?06/18/2021 3.5 3.5 - 5.1 mmol/L Final  ?  ?  ?  ?  Passed - Patient is not pregnant  ?  ?  Passed - Last BP in normal range  ?  BP Readings from Last 1 Encounters:  ?07/13/21 110/74  ?  ?  ?  ?  Passed - Valid encounter within last 6 months  ?  Recent Outpatient Visits   ? ?      ? 1 month ago Type 2 diabetes mellitus with other specified complication, without long-term current use of insulin (HCC)  ?  Primary Care at [facility], Lauris Poag, MD  ? 2 months ago Neuropathic pain of both feet  ? Primary Care at [facility], Amy J, NP  ? 3 months ago Type 2 diabetes mellitus with diabetic polyneuropathy, without long-term current use of insulin (HCC)  ? Primary Care at [facility], MD  ? 5 months ago Diabetic polyneuropathy associated with type 2 diabetes mellitus (HCC)  ? Primary Care at [facility], MD  ? 6 months ago Diabetes mellitus type 2 in nonobese [facility])  ? Primary Care at [facility], MD  ? ?  ?  ?Future Appointments   ? ?        ? In 1 month [name], MD Primary Care at [facility]  ? ?  ? ?  ?  ?  ? ? ? ? ?

## 2021-08-24 NOTE — Telephone Encounter (Unsigned)
Copied from CRM [id number]. Topic: Quick Communication - Rx Refill/Question ?>> Aug 24, 2021  9:26 AM Gaetana Michaelis A wrote: ?Medication: Rx #: [id number]  ?pregabalin (LYRICA) 150 MG capsule [[id number]] - patient has 0 tablets  ? ?Rx #: [id number]  ?lisinopril (ZESTRIL) 5 MG tablet [[id number]] - patient has 0 tablets  ? ? ?Has the patient contacted their pharmacy? Yes.   ?(Agent: If no, request that the patient contact the pharmacy for the refill. If patient does not wish to contact the pharmacy document the reason why and proceed with request.) ?(Agent: If yes, when and what did the pharmacy advise?) ? ?Preferred Pharmacy (with phone number or street name): [facility] ?[address], [address] [place] [address] ?Phone: [phone] Fax: [phone] ?Hours: M-F 7:30a-6:00p ? ? ?Has the patient been seen for an appointment in the last year OR does the patient have an upcoming appointment? Yes.   ? ?Agent: Please be advised that RX refills may take up to 3 business days. We ask that you follow-up with your pharmacy. ?

## 2021-08-24 NOTE — Telephone Encounter (Signed)
?  Chief Complaint: requesting medications be sent to pharmacy today due to working out of town and can not make multiple trips to pharmacy  ?Symptoms: reports he might "pass out" if he does not get medications refilled. Patient out of lisinopril 5 mg due to losing bottle in his lunch box and was thrown away. Reports he will pay for medication. Also requesting refill for lyrica 150 mg  ?Frequency: na  ?Pertinent Negatives: Patient denies passing out or feeling faint.  ?Disposition: [] ED /[] Urgent Care (no appt availability in office) / [] Appointment(In office/virtual)/ []  [facility]/ [] Home Care/ [] Refused Recommended Disposition /[] [facility]/ [x]  Follow-up with PCP ?Additional Notes:  ? ? ?Please advise and refill lisinopril 5 mg and lyrica 150 mg . Patient requesting a call back today when requests sent to pharmacy due to long drive from work to get medications.  ? ? Answer Assessment - Initial Assessment Questions ?1. ONSET: "How long were you unconscious?" (minutes) "When did it happen?" ?    Na  ?2. CONTENT: "What happened during period of unconsciousness?" (e.g., seizure activity)  ?    na ?3. MENTAL STATUS: "Alert and oriented now?" (oriented x 3 = name, month, location)  ?    Yes  ?4. TRIGGER: "What do you think caused the fainting?" "What were you doing just before you fainted?"  (e.g., exercise, sudden standing up, prolonged standing) ?    Did not faint or pass out but if patient does not get medication he feels he may pass out  ?5. RECURRENT SYMPTOM: "Have you ever passed out before?" If Yes, ask: "When was the last time?" and "What happened that time?"  ?    na ?6. INJURY: "Did you sustain any injury during the fall?"  ?    na ?7. CARDIAC SYMPTOMS: "Have you had any of the following symptoms: chest pain, difficulty breathing, palpitations?" ?    na ?8. NEUROLOGIC SYMPTOMS: "Have you had any of the following symptoms: headache, numbness, vertigo, weakness?" ?    na ?9. GI  SYMPTOMS: "Have you had any of the following symptoms: abdominal pain, vomiting, diarrhea, blood in stools?" ?    na ?10. OTHER SYMPTOMS: "Do you have any other symptoms?" ?      na ?11. PREGNANCY: "Is there any chance you are pregnant?" "When was your last menstrual period?" ?      na ? ?Protocols used: Fainting-A-AH ? ?

## 2021-08-24 NOTE — Telephone Encounter (Signed)
Requested Prescriptions  ?Pending Prescriptions Disp Refills  ?? pregabalin (LYRICA) 150 MG capsule 60 capsule 0  ?  Sig: Take 1 capsule (150 mg total) by mouth 2 (two) times daily.  ?  ? Not Delegated - Neurology:  Anticonvulsants - Controlled - pregabalin Failed - 08/24/2021  1:21 PM  ?  ?  Failed - This refill cannot be delegated  ?  ?  Passed - Cr in normal range and within 360 days  ?  Creat  ?Date Value Ref Range Status  ?09/02/2014 0.90 0.50 - 1.35 mg/dL Final  ? ?Creatinine, Ser  ?Date Value Ref Range Status  ?06/18/2021 0.94 0.61 - 1.24 mg/dL Final  ?   ?  ?  Passed - Completed PHQ-2 or PHQ-9 in the last 360 days  ?  ?  Passed - Valid encounter within last 12 months  ?  Recent Outpatient Visits   ?      ? 1 month ago Type 2 diabetes mellitus with other specified complication, without long-term current use of insulin (HCC)  ? Primary Care at [facility], Lauris Poag, MD  ? 2 months ago Neuropathic pain of both feet  ? Primary Care at [facility], Amy J, NP  ? 3 months ago Type 2 diabetes mellitus with diabetic polyneuropathy, without long-term current use of insulin (HCC)  ? Primary Care at [facility], MD  ? 5 months ago Diabetic polyneuropathy associated with type 2 diabetes mellitus (HCC)  ? Primary Care at [facility], MD  ? 6 months ago Diabetes mellitus type 2 in nonobese [facility]  ? [facility] Care at [facility], MD  ?  ?  ?Future Appointments   ?        ? In 1 month [name], MD Primary Care at [facility]  ?  ? ?  ?  ?  ?? lisinopril (ZESTRIL) 5 MG tablet 90 tablet 0  ?  Sig: Take 1 tablet (5 mg total) by mouth daily.  ?  ? Cardiovascular:  ACE Inhibitors Passed - 08/24/2021  1:21 PM  ?  ?  Passed - Cr in normal range and within 180 days  ?  Creat  ?Date Value Ref Range Status  ?09/02/2014 0.90 0.50 - 1.35 mg/dL Final  ? ?Creatinine, Ser  ?Date Value Ref Range Status  ?06/18/2021 0.94 0.61 - 1.24 mg/dL Final  ?   ?  ?  Passed -  K in normal range and within 180 days  ?  Potassium  ?Date Value Ref Range Status  ?06/18/2021 3.5 3.5 - 5.1 mmol/L Final  ?   ?  ?  Passed - Patient is not pregnant  ?  ?  Passed - Last BP in normal range  ?  BP Readings from Last 1 Encounters:  ?07/13/21 110/74  ?   ?  ?  Passed - Valid encounter within last 6 months  ?  Recent Outpatient Visits   ?      ? 1 month ago Type 2 diabetes mellitus with other specified complication, without long-term current use of insulin (HCC)  ? Primary Care at [facility], Lauris Poag, MD  ? 2 months ago Neuropathic pain of both feet  ? Primary Care at [facility], Amy J, NP  ? 3 months ago Type 2 diabetes mellitus with diabetic polyneuropathy, without long-term current use of insulin (HCC)  ? Primary Care at [facility], MD  ? 5  months ago Diabetic polyneuropathy associated with type 2 diabetes mellitus (HCC)  ? Primary Care at [facility], MD  ? 6 months ago Diabetes mellitus type 2 in nonobese [facility])  ? Primary Care at [facility], MD  ?  ?  ?Future Appointments   ?        ? In 1 month [name], MD Primary Care at [facility]  ?  ? ?  ?  ?  ? ? ?

## 2021-08-26 ENCOUNTER — Other Ambulatory Visit: Payer: Self-pay

## 2021-08-26 ENCOUNTER — Emergency Department (HOSPITAL_COMMUNITY)
Admission: EM | Admit: 2021-08-26 | Discharge: 2021-08-26 | Disposition: A | Discharge status: Home / Self Care | Payer: No Typology Code available for payment source | Attending: Emergency Medicine | Admitting: Emergency Medicine

## 2021-08-26 ENCOUNTER — Encounter (HOSPITAL_COMMUNITY): Payer: Self-pay | Admitting: Emergency Medicine

## 2021-08-26 DIAGNOSIS — Z79899 Other long term (current) drug therapy: Secondary | ICD-10-CM | POA: Insufficient documentation

## 2021-08-26 DIAGNOSIS — Z7984 Long term (current) use of oral hypoglycemic drugs: Secondary | ICD-10-CM | POA: Insufficient documentation

## 2021-08-26 DIAGNOSIS — E1142 Type 2 diabetes mellitus with diabetic polyneuropathy: Secondary | ICD-10-CM

## 2021-08-26 DIAGNOSIS — I10 Essential (primary) hypertension: Secondary | ICD-10-CM | POA: Insufficient documentation

## 2021-08-26 MED ORDER — GABAPENTIN 600 MG PO TABS
600.0000 mg | ORAL_TABLET | Freq: Three times a day (TID) | ORAL | 1 refills | Status: DC
  Filled 2021-08-26 – 2021-09-21 (×2): qty 90, 30d supply, fill #0
  Filled 2021-11-16: qty 90, 30d supply, fill #1

## 2021-08-26 MED ORDER — PREGABALIN 150 MG PO CAPS
150.0000 mg | ORAL_CAPSULE | Freq: Two times a day (BID) | ORAL | 0 refills | Status: DC
  Filled 2021-08-26: qty 10, 5d supply, fill #0

## 2021-08-26 NOTE — Discharge Instructions (Addendum)
Both of the primary care offices we discussed are attached to these discharge papers.  I can only give you a few days worth of your Lyrica, you must follow-up with your PCP.  Your gabapentin has also been refilled. ?

## 2021-08-26 NOTE — ED Triage Notes (Signed)
Patient complains of bilateral foot pain for the last several months since being diagnosed with diabetes.Pain is worse after working. PAtient is alert, oriented,and in no apparent distress at this time. ?

## 2021-08-26 NOTE — ED Provider Notes (Signed)
?[facility] DE[facility]MENT ?Provider Note ? ? ?CSN: [id number] ?Arrival date & time: 08/26/21  1043 ? ?  ? ?History ? ?Chief Complaint  ?Patient presents with  ? Foot Pain  ? ? ?[name] is a 56 y.o. male with a past medical history of hypertension, type 2 diabetes and chronic hep C presenting today with bilateral foot pain.  Says that he works in [employer] and he is having severe pain on the bottoms of his feet.  Better when he is resting.  History of diabetes and says that he has not been able to fill his Lyrica and gabapentin for neuropathy.  Says that that is all he needs.  Has not noted any wounds, stepped on any items, lost feeling in his feet, noticed any swelling or skin changes. ? ? ?Home Medications ?Prior to Admission medications   ?Medication Sig Start Date End Date Taking? Authorizing Provider  ?cholecalciferol (VITAMIN D3) 25 MCG (1000 UNIT) tablet Take 1,000 Units by mouth daily.    [provider]  ?folic acid (FOLVITE) 1 MG tablet Take 1 tablet (1 mg total) by mouth daily. ?Patient not taking: Reported on 07/13/2021 02/05/21   [name], MD  ?gabapentin (NEURONTIN) 600 MG tablet Take 1 tablet (600 mg total) by mouth 3 (three) times daily. 08/26/21   Royal Beirne A, PA-C  ?glipiZIDE (GLUCOTROL) 5 MG tablet Take 1 tablet (5 mg total) by mouth 2 (two) times daily. 08/21/21 11/19/21  [name], MD  ?glucose blood (TRUE METRIX BLOOD GLUCOSE TEST) test strip Use as instructed ?Patient not taking: Reported on 07/13/2021 01/15/21   [name], [name], PA-C  ?lisinopril (ZESTRIL) 5 MG tablet Take 1 tablet (5 mg total) by mouth daily. 08/24/21   [name], MD  ?Omega-3 Fatty Acids (FISH OIL) 1000 MG CAPS Take by mouth.    [provider]  ?potassium chloride SA (KLOR-CON) 20 MEQ tablet Take 1 tablet (20 mEq total) by mouth daily. ?Patient not taking: Reported on 07/13/2021 01/06/21   [name], MD  ?pregabalin (LYRICA) 100 MG capsule Take 1 capsule  (100 mg total) by mouth 2 (two) times daily. 07/01/21 08/30/21  [name], PA-C  ?pregabalin (LYRICA) 150 MG capsule Take 1 capsule (150 mg total) by mouth 2 (two) times daily for 5 days. 08/26/21 08/31/21  [name] A, PA-C  ?Sofosbuvir-Velpatasvir (EPCLUSA) 400-100 MG TABS Take 1 tablet by mouth daily. ?Patient not taking: Reported on 07/13/2021 02/18/21   [name], RPH-CPP  ?thiamine 100 MG tablet Take 1 tablet (100 mg total) by mouth daily. ?Patient not taking: Reported on 07/13/2021 01/15/21   [name], [name], PA-C  ?TRUEplus Lancets 28G MISC use as directed ?Patient not taking: Reported on 07/13/2021 01/15/21   [name], [name], PA-C  ?   ? ?Allergies    ?Ibuprofen, Aspirin, Other, and Tylenol [acetaminophen]   ? ?Review of Systems   ?Review of Systems ? ?Physical Exam ?Updated Vital Signs ?BP (!) 139/98   Pulse 86   Temp (!) 97.5 ?F (36.4 ?C) (Oral)   Resp 14   SpO2 97%  ?Physical Exam ?Vitals and nursing note reviewed.  ?Constitutional:   ?   Appearance: Normal appearance.  ?HENT:  ?   Head: Normocephalic and atraumatic.  ?Eyes:  ?   General: No scleral icterus. ?   Conjunctiva/sclera: Conjunctivae normal.  ?Pulmonary:  ?   Effort: Pulmonary effort is normal. No respiratory distress.  ?Musculoskeletal:  ?   Comments: Full  range of motion of bilateral feet and MTPs.  Sensation intact however somewhat decreased on the soles of the feet.  1+ DP pulses bilaterally.  No wounds or signs of infection.  ?Skin: ?   Findings: No rash.  ?Neurological:  ?   Mental Status: He is alert.  ?Psychiatric:     ?   Mood and Affect: Mood normal.  ? ? ?ED Results / Procedures / Treatments   ?Labs ?(all labs ordered are listed, but only abnormal results are displayed) ?Labs Reviewed - No data to display ? ?EKG ?None ? ?Radiology ?No results found. ? ?Procedures ?Procedures  ? ? ?Medications Ordered in ED ?Medications - No data to display ? ?ED Course/ Medical Decision Making/ A&P ?  ?                        ?Medical  Decision Making ?Risk ?Prescription drug management. ? ? ?56 year old presenting today with peripheral neuropathy.  Has run out of his medication such as Lyrica and gabapentin. ? ?Physical exam benign.  No signs of diabetic foot ulcers, wounds or skin infections.  Neurovascularly intact. ? ?Disposition: I we will refill the patient's gabapentin and 5 days worth of Lyrica.  He has been instructed that he must follow-up with the primary care provider who usually prescribes his medications or find a new one.  Resources for health department as well as community health and wellness were given.  All of his questions were answered and he will be discharged from triage.  He is agreeable and thankful for the care. ? ? ?Social determinants of health includes lack of health insurance ? ?Final Clinical Impression(s) / ED Diagnoses ?Final diagnoses:  ?Diabetic polyneuropathy associated with type 2 diabetes mellitus (HCC)  ? ? ?Rx / DC Orders ?ED Discharge Orders   ? ?      Ordered  ?  gabapentin (NEURONTIN) 600 MG tablet  3 times daily       ? 08/26/21 1157  ?  pregabalin (LYRICA) 150 MG capsule  2 times daily       ? 08/26/21 1157  ? ?  ?  ? ?  ? ?Results and diagnoses were explained to the patient. Return precautions discussed in full. Patient had no additional questions and expressed complete understanding. ? ? ?This chart was dictated using voice recognition software.  Despite best efforts to proofread,  errors can occur which can change the documentation meaning.  ?  ?Saddie Benders, PA-C ?08/26/21 1300 ? ?  ?Sloan Leiter, DO ?08/27/21 0759 ? ?

## 2021-09-02 ENCOUNTER — Ambulatory Visit (INDEPENDENT_AMBULATORY_CARE_PROVIDER_SITE_OTHER): Payer: Self-pay | Admitting: Physician Assistant

## 2021-09-02 ENCOUNTER — Encounter: Payer: Self-pay | Admitting: Physician Assistant

## 2021-09-02 ENCOUNTER — Other Ambulatory Visit: Payer: Self-pay

## 2021-09-02 VITALS — BP 138/89 | HR 89 | Temp 98.6°F | Resp 18 | Ht 69.0 in | Wt 176.0 lb

## 2021-09-02 DIAGNOSIS — E1169 Type 2 diabetes mellitus with other specified complication: Secondary | ICD-10-CM

## 2021-09-02 DIAGNOSIS — Z125 Encounter for screening for malignant neoplasm of prostate: Secondary | ICD-10-CM

## 2021-09-02 DIAGNOSIS — B182 Chronic viral hepatitis C: Secondary | ICD-10-CM

## 2021-09-02 DIAGNOSIS — E1142 Type 2 diabetes mellitus with diabetic polyneuropathy: Secondary | ICD-10-CM

## 2021-09-02 DIAGNOSIS — G5793 Unspecified mononeuropathy of bilateral lower limbs: Secondary | ICD-10-CM

## 2021-09-02 LAB — POCT GLYCOSYLATED HEMOGLOBIN (HGB A1C): Hemoglobin A1C: 7.2 % — AB (ref 4.0–5.6)

## 2021-09-02 MED ORDER — PREGABALIN 150 MG PO CAPS
150.0000 mg | ORAL_CAPSULE | Freq: Two times a day (BID) | ORAL | 1 refills | Status: DC
  Filled 2021-09-02 – 2021-09-03 (×2): qty 60, 30d supply, fill #0
  Filled 2021-10-02: qty 60, 30d supply, fill #1

## 2021-09-02 NOTE — Progress Notes (Signed)
Patient has eaten and taken medication today. 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 ?Patient request refill on Lyrica 150mg . ?

## 2021-09-02 NOTE — Patient Instructions (Addendum)
We will call you with today's lab results. ? ?Roney Jaffe, PA-C ?Physician Assistant ?[facility] ?[email or web address] ? ? ?Diabetic Neuropathy ?Diabetic neuropathy refers to nerve damage that is caused by diabetes. Over time, people with diabetes can develop nerve damage throughout the body. There are several types of diabetic neuropathy: ?Peripheral neuropathy. This is the most common type of diabetic neuropathy. It damages the nerves that carry signals between the spinal cord and other parts of the body (peripheral nerves). This usually affects nerves in the feet, legs, hands, and arms. ?Autonomic neuropathy. This type causes damage to nerves that control involuntary functions (autonomic nerves). Involuntary functions are functions of the body that you do not control. They include heartbeat, body temperature, blood pressure, urination, digestion, sweating, sexual function, or response to changes in blood glucose. ?Focal neuropathy. This type of nerve damage affects one area of the body, such as an arm, a leg, or the face. The injury may involve one nerve or a small group of nerves. Focal neuropathy can be painful and unpredictable. It occurs most often in older adults with diabetes. This often develops suddenly, but usually improves over time and does not cause long-term problems. ?Proximal neuropathy. This type of nerve damage affects the nerves of the thighs, hips, buttocks, or legs. It causes severe pain, weakness, and muscle death (atrophy), usually in the thigh muscles. It is more common among older men and people who have type 2 diabetes. The length of recovery time may vary. ?What are the causes? ?Peripheral, autonomic, and focal neuropathies are caused by diabetes that is not well controlled with treatment. The cause of proximal neuropathy is not known, but it may be caused by inflammation related to uncontrolled blood glucose levels. ?What are the  signs or symptoms? ?Peripheral neuropathy ?Peripheral neuropathy develops slowly over time. When the nerves of the feet and legs no longer work, you may experience: ?Burning, stabbing, or aching pain in the legs or feet. ?Pain or cramping in the legs or feet. ?Loss of feeling (numbness) and inability to feel pressure or pain in the feet. This can lead to: ?Thick calluses or sores on areas of constant pressure. ?Ulcers. ?Reduced ability to feel temperature changes. ?Foot deformities. ?Muscle weakness. ?Loss of balance or coordination. ?Autonomic neuropathy ?The symptoms of autonomic neuropathy vary depending on which nerves are affected. Symptoms may include: ?Problems with digestion, such as: ?Nausea or vomiting. ?Poor appetite. ?Bloating. ?Diarrhea or constipation. ?Trouble swallowing. ?Losing weight without trying to. ?Problems with the heart, blood, and lungs, such as: ?Dizziness, especially when standing up. ?Fainting. ?Shortness of breath. ?Irregular heartbeat. ?Bladder problems, such as: ?Trouble starting or stopping urination. ?Leaking urine. ?Trouble emptying the bladder. ?Urinary tract infections (UTIs). ?Problems with other body functions, such as: ?Sweat. You may sweat too much or too little. ?Temperature. You might get hot easily. Or, you might feel cold more than usual. ?Sexual function. Men may not be able to get or maintain an erection. Women may have vaginal dryness and difficulty with arousal. ?Focal neuropathy ?Symptoms affect only one area of the body. Common symptoms include: ?Numbness. ?Tingling. ?Burning pain. ?Prickling feeling. ?Very sensitive skin. ?Weakness. ?Inability to move (paralysis). ?Muscle twitching. ?Muscles getting smaller (wasting). ?Poor coordination. ?Double or blurred vision. ?Proximal neuropathy ?Sudden, severe pain in the hip, thigh, or buttocks. Pain may spread from the back into the legs (sciatica). ?Pain and numbness in the arms and legs. ?Tingling. ?Loss of bladder  control or bowel control. ?Weakness and wasting of thigh muscles. ?  Difficulty getting up from a seated position. ?Abdominal swelling. ?Unexplained weight loss. ?How is this diagnosed? ?Diagnosis varies depending on the type of neuropathy your health care provider suspects. ?Peripheral neuropathy ?Your health care provider will do a neurologic exam. This exam checks your reflexes, how you move, and what you can feel. You may have other tests, such as: ?Blood tests. ?Tests of the fluid that surrounds the spinal cord (lumbar puncture). ?CT scan. ?MRI. ?Checking the nerves that control muscles (electromyogram, or EMG). ?Checking how quickly signals pass through your nerves (nerve conduction study). ?Checking a small piece of a nerve using a microscope (biopsy). ?Autonomic neuropathy ?You may have tests, such as: ?Tests to measure your blood pressure and heart rate. You may be secured to an exam table that moves you from a lying position to an upright position (table tilt test). ?Breathing tests to check your lungs. ?Tests to check how food moves through the digestive system (gastric emptying tests). ?Blood, sweat, or urine tests. ?Ultrasound of your bladder. ?Spinal fluid tests. ?Focal neuropathy ?This condition may be diagnosed with: ?A neurologic exam. ?CT scan. ?MRI. ?EMG. ?Nerve conduction study. ?Proximal neuropathy ?There is no test to diagnose this type of neuropathy. You may have tests to rule out other possible causes of this type of neuropathy. Tests may include: ?X-rays of your spine and lumbar region. ?Lumbar puncture. ?MRI. ?How is this treated? ?The goal of treatment is to keep nerve damage from getting worse. Treatment may include: ?Following your diabetes management plan. This will help keep your blood glucose level and your A1C level within your target range. This is the most important treatment. ?Using prescription pain medicine. ?Follow these instructions at home: ?Diabetes management ?Follow your  diabetes management plan as told by your health care provider. ?Check your blood glucose levels. ?Keep your blood glucose in your target range. ?Have your A1C level checked at least two times a year, or as often as told. ?Take over the counter and prescription medicines only as told by your health care provider. This includes insulin and diabetes medicine. ? ?Lifestyle ? ?Do not use any products that contain nicotine or tobacco, such as cigarettes, e-cigarettes, and chewing tobacco. If you need help quitting, ask your health care provider. ?Be physically active every day. Include strength training and balance exercises. ?Follow a healthy meal plan. ?Work with your health care provider to manage your blood pressure. ?General instructions ?Ask your health care provider if the medicine prescribed to you requires you to avoid driving or using machinery. ?Check your skin and feet every day for cuts, bruises, redness, blisters, or sores. ?Keep all follow-up visits. This is important. ?Contact a health care provider if: ?You have burning, stabbing, or aching pain in your legs or feet. ?You are unable to feel pressure or pain in your feet. ?You develop problems with digestion, such as: ?Nausea. ?Vomiting. ?Bloating. ?Constipation. ?Diarrhea. ?Abdominal pain. ?You have difficulty with urination, such as: ?Inability to control when you urinate (incontinence). ?Inability to completely empty the bladder (retention). ?You feel as if your heart is racing (palpitations). ?You feel dizzy, weak, or faint when you stand up. ?Get help right away if: ?You cannot urinate. ?You have sudden weakness or loss of coordination. ?You have trouble speaking. ?You have pain or pressure in your chest. ?You have an irregular heartbeat. ?You have sudden inability to move a part of your body. ?These symptoms may represent a serious problem that is an emergency. Do not wait to see if the symptoms  will go away. Get medical help right away. Call your  local emergency services (911 in the [place] Do not drive yourself to the hospital. ?Summary ?Diabetic neuropathy is nerve damage that is caused by diabetes. It can cause numbness and pain in the arms, legs, di

## 2021-09-02 NOTE — Progress Notes (Signed)
Have ? ?Established Patient Office Visit ? ?Subjective:  ?Patient ID: Chad Wolfe, male    DOB: 1966-02-14  Age: 56 y.o. MRN: [id number] ? ?CC:  ?Chief Complaint  ?Patient presents with  ? Medication Refill  ? ? ?HPI ?Willey Blade request for refill of his Lyrica.  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 ? ?Does request screening for hepatitis C, states that he did treatment, but was unable to follow-up with infectious disease.  States that he did miss some of the treatment and is not sure if it was effective. ? ? ? ? ? ?Past Medical History:  ?Diagnosis Date  ? Diabetes mellitus type 2 in nonobese ([place]) 01/21/2016  ? Gastric ulcer   ? Hepatitis C   ? Hyperlipidemia   ? Hypertension   ? Substance abuse ([place])   ? ? ?Past Surgical History:  ?Procedure Laterality Date  ? I & D EXTREMITY Right 02/26/2017  ? Procedure: IRRIGATION AND DEBRIDEMENT AND REDUCTION OF RIGHT MIDDLE FINGER;  Surgeon: Roseanne Kaufman, MD;  Location: [facility];  Service: Orthopedics;  Laterality: Right;  ? STOMACH SURGERY    ? ? ?Family History  ?Problem Relation Age of Onset  ? Diabetes Father   ? Hypertension Father   ? Diabetes Sister   ? Hypertension Sister   ? Diabetes Brother   ? Hypertension Brother   ? ? ?Social History  ? ?Socioeconomic History  ? Marital status: Divorced  ?  Spouse name: Not on file  ? Number of children: Not on file  ? Years of education: Not on file  ? Highest education level: Not on file  ?Occupational History  ? Not on file  ?Tobacco Use  ? Smoking status: Never  ? Smokeless tobacco: Never  ?Substance and Sexual Activity  ? Alcohol use: Yes  ?  Alcohol/week: 0.0 standard drinks  ?  Comment: drank a quart of beer this morning PT DENIES 09/26/15  ? Drug use: No  ?  Types: Cocaine, Oxycodone  ?  Comment: oxycodone 2 days ago "my sister gave me one"; no cocaine use  ? Sexual activity: Not Currently  ?   Comment: opiates  ?Other Topics Concern  ? Not on file  ?Social History Narrative  ? Not on file  ? ?Social Determinants of Health  ? ?Financial Resource Strain: Not on file  ?Food Insecurity: Not on file  ?Transportation Needs: Not on file  ?Physical Activity: Not on file  ?Stress: Not on file  ?Social Connections: Not on file  ?Intimate Partner Violence: Not on file  ? ? ?Outpatient Medications Prior to Visit  ?Medication Sig Dispense Refill  ? cholecalciferol (VITAMIN D3) 25 MCG (1000 UNIT) tablet Take 1,000 Units by mouth daily.    ? gabapentin (NEURONTIN) 600 MG tablet Take 1 tablet (600 mg total) by mouth 3 (three) times daily. 90 tablet 1  ? glipiZIDE (GLUCOTROL) 5 MG tablet Take 1 tablet (5 mg total) by mouth 2 (two) times daily. 180 tablet 0  ? glucose blood (TRUE METRIX BLOOD GLUCOSE TEST) test strip Use as instructed 100 each 12  ? lisinopril (ZESTRIL) 5 MG tablet Take 1 tablet (5 mg total) by mouth daily. 90 tablet 0  ? Omega-3 Fatty Acids (FISH OIL) 1000 MG CAPS Take by mouth.    ? TRUEplus Lancets 28G MISC use as directed 100  each 12  ? folic acid (FOLVITE) 1 MG tablet Take 1 tablet (1 mg total) by mouth daily. (Patient not taking: Reported on 07/13/2021) 90 tablet 1  ? potassium chloride SA (KLOR-CON) 20 MEQ tablet Take 1 tablet (20 mEq total) by mouth daily. (Patient not taking: Reported on 07/13/2021) 7 tablet 0  ? pregabalin (LYRICA) 100 MG capsule Take 1 capsule (100 mg total) by mouth 2 (two) times daily. 60 capsule 1  ? pregabalin (LYRICA) 150 MG capsule Take 1 capsule (150 mg total) by mouth 2 (two) times daily for 5 days. (Patient not taking: Reported on 09/02/2021) 10 capsule 0  ? Sofosbuvir-Velpatasvir (EPCLUSA) 400-100 MG TABS Take 1 tablet by mouth daily. (Patient not taking: Reported on 07/13/2021) 30 tablet 2  ? thiamine 100 MG tablet Take 1 tablet (100 mg total) by mouth daily. (Patient not taking: Reported on 07/13/2021) 30 tablet 1  ? ?No facility-administered medications prior to visit.   ? ? ?Allergies  ?Allergen Reactions  ? Ibuprofen Other (See Comments)  ?  Stomach upset  ? Aspirin Other (See Comments)  ? Other Other (See Comments)  ?  MD told him not to take [facility] due to Hep C  ? Tylenol [Acetaminophen] Other (See Comments)  ?  HEP C  ? ? ?ROS ?Review of Systems  ?Constitutional: Negative.   ?HENT: Negative.    ?Eyes: Negative.   ?Respiratory:  Negative for shortness of breath.   ?Cardiovascular:  Negative for chest pain.  ?Gastrointestinal: Negative.   ?Endocrine: Negative.   ?Genitourinary: Negative.   ?Musculoskeletal:  Positive for gait problem.  ?Skin: Negative.   ?Allergic/Immunologic: Negative.   ?Neurological:  Negative for weakness and headaches.  ?Hematological: Negative.   ?Psychiatric/Behavioral: Negative.    ? ?  ?Objective:  ?  ?Physical Exam ?Vitals and nursing note reviewed.  ?Constitutional:   ?   Appearance: Normal appearance.  ?HENT:  ?   Head: Normocephalic and atraumatic.  ?   Right Ear: External ear normal.  ?   Left Ear: External ear normal.  ?   Nose: Nose normal.  ?   Mouth/Throat:  ?   Mouth: Mucous membranes are moist.  ?   Pharynx: Oropharynx is clear.  ?Eyes:  ?   Extraocular Movements: Extraocular movements intact.  ?   Conjunctiva/sclera: Conjunctivae normal.  ?   Pupils: Pupils are equal, round, and reactive to light.  ?Cardiovascular:  ?   Rate and Rhythm: Normal rate and regular rhythm.  ?   Pulses: Normal pulses.  ?   Heart sounds: Normal heart sounds.  ?Pulmonary:  ?   Effort: Pulmonary effort is normal.  ?   Breath sounds: Normal breath sounds.  ?Musculoskeletal:     ?   General: Normal range of motion.  ?   Cervical back: Normal range of motion and neck supple.  ?Skin: ?   General: Skin is warm and dry.  ?Neurological:  ?   General: No focal deficit present.  ?   Mental Status: He is alert and oriented to person, place, and time.  ?Psychiatric:     ?   Mood and Affect: Mood normal.     ?   Behavior: Behavior normal.     ?   Thought Content: Thought content  normal.     ?   Judgment: Judgment normal.  ? ? ?BP 138/89 (BP Location: Left Arm, Patient Position: Sitting, Cuff Size: Normal)   Pulse 89   Temp 98.6 ?F (37 ?C) (  Oral)   Resp 18   Ht _0  (1.753 m)   Wt 176 lb (79.8 kg)   SpO2 98%   BMI 25.99 kg/m?  ?Wt Readings from Last 3 Encounters:  ?09/02/21 176 lb (79.8 kg)  ?07/13/21 165 lb (74.8 kg)  ?06/05/21 160 lb (72.6 kg)  ? ? ? ?Health Maintenance Due  ?Topic Date Due  ? COVID-19 Vaccine (1) Never done  ? FOOT EXAM  Never done  ? OPHTHALMOLOGY EXAM  Never done  ? Zoster Vaccines- Shingrix (1 of 2) Never done  ? ? ?There are no preventive care reminders to display for this patient. ? ?Lab Results  ?Component Value Date  ? TSH 0.768 09/02/2014  ? ?Lab Results  ?Component Value Date  ? WBC 6.6 06/18/2021  ? HGB 15.3 06/18/2021  ? HCT 44.8 06/18/2021  ? MCV 93.3 06/18/2021  ? PLT 236 06/18/2021  ? ?Lab Results  ?Component Value Date  ? NA 134 09/02/2021  ? K 4.3 09/02/2021  ? CO2 25 06/18/2021  ? GLUCOSE 99 09/02/2021  ? BUN 13 09/02/2021  ? CREATININE 1.04 09/02/2021  ? BILITOT 0.2 09/02/2021  ? ALKPHOS 78 09/02/2021  ? AST 28 09/02/2021  ? ALT 33 02/04/2021  ? PROT 7.6 09/02/2021  ? ALBUMIN 4.2 09/02/2021  ? CALCIUM 9.1 09/02/2021  ? ANIONGAP 11 06/18/2021  ? EGFR 84 09/02/2021  ? ?Lab Results  ?Component Value Date  ? CHOL 207 (H) 10/22/2015  ? ?Lab Results  ?Component Value Date  ? HDL 49 10/22/2015  ? ?Lab Results  ?Component Value Date  ? LDLCALC 132 (H) 10/22/2015  ? ?Lab Results  ?Component Value Date  ? TRIG 129 10/22/2015  ? ?Lab Results  ?Component Value Date  ? CHOLHDL 4.2 10/22/2015  ? ?Lab Results  ?Component Value Date  ? HGBA1C 7.2 (A) 09/02/2021  ? ? ?  ?Assessment & Plan:  ? ?Problem List Items Addressed This Visit   ? ?  ? Digestive  ? Chronic hepatitis C virus infection ([place])  ? Relevant Orders  ? HCV Ab w Reflex to Quant PCR  ? ?Other Visit Diagnoses   ? ? Neuropathic pain of both feet    -  Primary  ? Relevant Medications  ? pregabalin  (LYRICA) 150 MG capsule  ? Diabetic polyneuropathy associated with type 2 diabetes mellitus ([place])      ? Relevant Medications  ? pregabalin (LYRICA) 150 MG capsule  ? Other Relevant Orders  ? HgB A1c (Completed)  ? Comp. Me

## 2021-09-03 ENCOUNTER — Other Ambulatory Visit: Payer: Self-pay

## 2021-09-03 LAB — COMP. METABOLIC PANEL (12)
AST: 28 IU/L (ref 0–40)
Albumin/Globulin Ratio: 1.2 (ref 1.2–2.2)
Albumin: 4.2 g/dL (ref 3.8–4.9)
Alkaline Phosphatase: 78 IU/L (ref 44–121)
BUN/Creatinine Ratio: 13 (ref 9–20)
BUN: 13 mg/dL (ref 6–24)
Bilirubin Total: 0.2 mg/dL (ref 0.0–1.2)
Calcium: 9.1 mg/dL (ref 8.7–10.2)
Chloride: 97 mmol/L (ref 96–106)
Creatinine, Ser: 1.04 mg/dL (ref 0.76–1.27)
Globulin, Total: 3.4 g/dL (ref 1.5–4.5)
Glucose: 99 mg/dL (ref 70–99)
Potassium: 4.3 mmol/L (ref 3.5–5.2)
Sodium: 134 mmol/L (ref 134–144)
Total Protein: 7.6 g/dL (ref 6.0–8.5)
eGFR: 84 mL/min/{1.73_m2} (ref >59)

## 2021-09-03 LAB — PSA: Prostate Specific Ag, Serum: 0.2 ng/mL (ref 0.0–4.0)

## 2021-09-04 LAB — HCV AB W REFLEX TO QUANT PCR: HCV Ab: NONREACTIVE

## 2021-09-04 LAB — HCV INTERPRETATION

## 2021-09-10 ENCOUNTER — Telehealth: Payer: Self-pay | Admitting: *Deleted

## 2021-09-10 NOTE — Telephone Encounter (Signed)
-----   Message from Roney Jaffe, PA-C sent at 09/04/2021  8:16 AM EDT ----- ?Please call patient and let him know that his kidney function and liver function are within normal limits, his screening for prostate cancer was negative.  He did not show any antibodies for hepatitis C. (He has had previous treatment ) ?

## 2021-09-10 NOTE — Telephone Encounter (Signed)
Patient verified DOB ?Patient is aware of all results being normal.  ?

## 2021-09-21 ENCOUNTER — Other Ambulatory Visit: Payer: Self-pay

## 2021-09-24 ENCOUNTER — Other Ambulatory Visit: Payer: Self-pay

## 2021-10-02 ENCOUNTER — Other Ambulatory Visit: Payer: Self-pay

## 2021-10-13 ENCOUNTER — Ambulatory Visit: Payer: No Typology Code available for payment source | Admitting: Family Medicine

## 2021-10-14 ENCOUNTER — Ambulatory Visit: Payer: No Typology Code available for payment source | Admitting: Family Medicine

## 2021-10-19 ENCOUNTER — Other Ambulatory Visit: Payer: Self-pay

## 2021-10-21 ENCOUNTER — Other Ambulatory Visit: Payer: Self-pay

## 2021-10-21 ENCOUNTER — Encounter: Payer: Self-pay | Admitting: Physician Assistant

## 2021-10-21 ENCOUNTER — Ambulatory Visit (INDEPENDENT_AMBULATORY_CARE_PROVIDER_SITE_OTHER): Payer: Self-pay | Admitting: Physician Assistant

## 2021-10-21 DIAGNOSIS — I10 Essential (primary) hypertension: Secondary | ICD-10-CM

## 2021-10-21 DIAGNOSIS — G5793 Unspecified mononeuropathy of bilateral lower limbs: Secondary | ICD-10-CM

## 2021-10-21 DIAGNOSIS — E1169 Type 2 diabetes mellitus with other specified complication: Secondary | ICD-10-CM

## 2021-10-21 MED ORDER — GLIPIZIDE 5 MG PO TABS
5.0000 mg | ORAL_TABLET | Freq: Two times a day (BID) | ORAL | 0 refills | Status: DC
  Filled 2021-10-21: qty 180, 90d supply, fill #0
  Filled 2021-11-16: qty 60, 30d supply, fill #0
  Filled 2021-12-17: qty 60, 30d supply, fill #1
  Filled 2022-01-18: qty 60, 30d supply, fill #2

## 2021-10-21 MED ORDER — LISINOPRIL 5 MG PO TABS
5.0000 mg | ORAL_TABLET | Freq: Every day | ORAL | 0 refills | Status: DC
  Filled 2021-10-21: qty 90, 90d supply, fill #0
  Filled 2021-11-16: qty 30, 30d supply, fill #0
  Filled 2021-12-17: qty 30, 30d supply, fill #1
  Filled 2022-01-18: qty 30, 30d supply, fill #2

## 2021-10-21 MED ORDER — PREGABALIN 200 MG PO CAPS
200.0000 mg | ORAL_CAPSULE | Freq: Two times a day (BID) | ORAL | 1 refills | Status: DC
  Filled 2021-10-21: qty 60, 30d supply, fill #0
  Filled 2021-11-19: qty 60, 30d supply, fill #1

## 2021-10-21 NOTE — Patient Instructions (Signed)
You are going to start taking 200 mg of Lyrica twice a day.  Do not increase this dose on your own. ? ?Please let us know if there is any else we can do for you ? ?Roney Jaffe, PA-C ?Physician Assistant ?[facility] ?[email or web address] ? ? ?Health Maintenance, Male ?Adopting a healthy lifestyle and getting preventive care are important in promoting health and wellness. Ask your health care provider about: ?The right schedule for you to have regular tests and exams. ?Things you can do on your own to prevent diseases and keep yourself healthy. ?What should I know about diet, weight, and exercise? ?Eat a healthy diet ? ?Eat a diet that includes plenty of vegetables, fruits, low-fat dairy products, and lean protein. ?Do not eat a lot of foods that are high in solid fats, added sugars, or sodium. ?Maintain a healthy weight ?Body mass index (BMI) is a measurement that can be used to identify possible weight problems. It estimates body fat based on height and weight. Your health care provider can help determine your BMI and help you achieve or maintain a healthy weight. ?Get regular exercise ?Get regular exercise. This is one of the most important things you can do for your health. Most adults should: ?Exercise for at least 150 minutes each week. The exercise should increase your heart rate and make you sweat (moderate-intensity exercise). ?Do strengthening exercises at least twice a week. This is in addition to the moderate-intensity exercise. ?Spend less time sitting. Even light physical activity can be beneficial. ?Watch cholesterol and blood lipids ?Have your blood tested for lipids and cholesterol at 58 years of age, then have this test every 5 years. ?You may need to have your cholesterol levels checked more often if: ?Your lipid or cholesterol levels are high. ?You are older than 58 years of age. ?You are at high risk for heart disease. ?What should I know  about cancer screening? ?Many types of cancers can be detected early and may often be prevented. Depending on your health history and family history, you may need to have cancer screening at various ages. This may include screening for: ?Colorectal cancer. ?Prostate cancer. ?Skin cancer. ?Lung cancer. ?What should I know about heart disease, diabetes, and high blood pressure? ?Blood pressure and heart disease ?High blood pressure causes heart disease and increases the risk of stroke. This is more likely to develop in people who have high blood pressure readings or are overweight. ?Talk with your health care provider about your target blood pressure readings. ?Have your blood pressure checked: ?Every 3-5 years if you are 30-43 years of age. ?Every year if you are 30 years old or older. ?If you are between the ages of 58 and 48 and are a current or former smoker, ask your health care provider if you should have a one-time screening for abdominal aortic aneurysm (AAA). ?Diabetes ?Have regular diabetes screenings. This checks your fasting blood sugar level. Have the screening done: ?Once every three years after age 17 if you are at a normal weight and have a low risk for diabetes. ?More often and at a younger age if you are overweight or have a high risk for diabetes. ?What should I know about preventing infection? ?Hepatitis B ?If you have a higher risk for hepatitis B, you should be screened for this virus. Talk with your health care provider to find out if you are at risk for hepatitis B infection. ?Hepatitis C ?Blood testing is recommended for: ?Everyone born  from 1945 through 1965. ?Anyone with known risk factors for hepatitis C. ?Sexually transmitted infections (STIs) ?You should be screened each year for STIs, including gonorrhea and chlamydia, if: ?You are sexually active and are younger than 58 years of age. ?You are older than 58 years of age and your health care provider tells you that you are at risk for  this type of infection. ?Your sexual activity has changed since you were last screened, and you are at increased risk for chlamydia or gonorrhea. Ask your health care provider if you are at risk. ?Ask your health care provider about whether you are at high risk for HIV. Your health care provider may recommend a prescription medicine to help prevent HIV infection. If you choose to take medicine to prevent HIV, you should first get tested for HIV. You should then be tested every 3 months for as long as you are taking the medicine. ?Follow these instructions at home: ?Alcohol use ?Do not drink alcohol if your health care provider tells you not to drink. ?If you drink alcohol: ?Limit how much you have to 0-2 drinks a day. ?Know how much alcohol is in your drink. In the [place] one drink equals one 12 oz bottle of beer (355 mL), one 5 oz glass of wine (148 mL), or one 1? oz glass of hard liquor (44 mL). ?Lifestyle ?Do not use any products that contain nicotine or tobacco. These products include cigarettes, chewing tobacco, and vaping devices, such as e-cigarettes. If you need help quitting, ask your health care provider. ?Do not use street drugs. ?Do not share needles. ?Ask your health care provider for help if you need support or information about quitting drugs. ?General instructions ?Schedule regular health, dental, and eye exams. ?Stay current with your vaccines. ?Tell your health care provider if: ?You often feel depressed. ?You have ever been abused or do not feel safe at home. ?Summary ?Adopting a healthy lifestyle and getting preventive care are important in promoting health and wellness. ?Follow your health care provider's instructions about healthy diet, exercising, and getting tested or screened for diseases. ?Follow your health care provider's instructions on monitoring your cholesterol and blood pressure. ?This information is not intended to replace advice given to you by your health care provider. Make sure  you discuss any questions you have with your health care provider. ?Document Revised: 10/13/2020 Document Reviewed: 10/13/2020 ?Elsevier Patient Education ? 2023 Elsevier Inc[employer] ? ?

## 2021-10-21 NOTE — Progress Notes (Signed)
Established Patient Office Visit  Subjective   Patient ID: Chad Wolfe, male    DOB: 21-Dec-1965  Age: 56 y.o. MRN: [id number]  Chief Complaint  Patient presents with   Medication Refill    States that he needs a refill of his Lyrica.  States that it is not offering much relief, states that he was referred for pain management, but unfortunately he was turned away due to not having "the right insurance"  States that he has been taking the gabapentin but does endorse that if he is going to work he is unable to take it because it makes his feet numb and states that he needs to be able to feel his feet while he is working.  States that this has caused him to increase the Lyrica dosing on his own with some relief.   Past Medical History:  Diagnosis Date   Diabetes mellitus type 2 in nonobese ([place]) 01/21/2016   Gastric ulcer    Hepatitis C    Hyperlipidemia    Hypertension    Substance abuse ([place])    Social History   Socioeconomic History   Marital status: Divorced    Spouse name: Not on file   Number of children: Not on file   Years of education: Not on file   Highest education level: Not on file  Occupational History   Not on file  Tobacco Use   Smoking status: Never   Smokeless tobacco: Never  Substance and Sexual Activity   Alcohol use: Yes    Alcohol/week: 0.0 standard drinks    Comment: drank a quart of beer this morning PT DENIES 09/26/15   Drug use: No    Types: Cocaine, Oxycodone    Comment: oxycodone 2 days ago "my sister gave me one"; no cocaine use   Sexual activity: Not Currently    Comment: opiates  Other Topics Concern   Not on file  Social History Narrative   Not on file   Social Determinants of Health   Financial Resource Strain: Not on file  Food Insecurity: Not on file  Transportation Needs: Not on file  Physical Activity: Not on file  Stress: Not on file  Social Connections: Not on file  Intimate Partner Violence: Not on file   Family  History  Problem Relation Age of Onset   Diabetes Father    Hypertension Father    Diabetes Sister    Hypertension Sister    Diabetes Brother    Hypertension Brother    Allergies  Allergen Reactions   Ibuprofen Other (See Comments)    Stomach upset   Aspirin Other (See Comments)   Other Other (See Comments)    MD told him not to take [facility] due to Hep C   Tylenol [Acetaminophen] Other (See Comments)    HEP C      Review of Systems  Constitutional: Negative.   HENT: Negative.    Eyes: Negative.   Respiratory:  Negative for shortness of breath.   Cardiovascular:  Negative for chest pain.  Gastrointestinal: Negative.   Genitourinary: Negative.   Musculoskeletal: Negative.   Skin: Negative.   Neurological:  Positive for tingling and sensory change.  Endo/Heme/Allergies: Negative.   Psychiatric/Behavioral: Negative.       Objective:     BP 114/66 (BP Location: Right Arm, Patient Position: Sitting, Cuff Size: Normal)   Pulse (!) 108   Temp 98.7 F (37.1 C) (Oral)   Resp 18   Ht $R'5\' 9"'nn$  (1.753  m)   Wt 176 lb (79.8 kg)   SpO2 93%   BMI 25.99 kg/m  BP Readings from Last 3 Encounters:  10/21/21 114/66  09/02/21 138/89  08/26/21 (!) 139/98      Physical Exam Vitals and nursing note reviewed.  Constitutional:      Appearance: Normal appearance.  HENT:     Head: Normocephalic and atraumatic.     Right Ear: External ear normal.     Left Ear: External ear normal.     Nose: Nose normal.     Mouth/Throat:     Mouth: Mucous membranes are moist.     Pharynx: Oropharynx is clear.  Eyes:     Extraocular Movements: Extraocular movements intact.     Conjunctiva/sclera: Conjunctivae normal.     Pupils: Pupils are equal, round, and reactive to light.  Cardiovascular:     Rate and Rhythm: Normal rate and regular rhythm.     Pulses: Normal pulses.          Dorsalis pedis pulses are 2+ on the right side.       Posterior tibial pulses are 2+ on the right side and 2+ on the  left side.     Heart sounds: Normal heart sounds.  Pulmonary:     Effort: Pulmonary effort is normal.     Breath sounds: Normal breath sounds.  Musculoskeletal:        General: Normal range of motion.     Cervical back: Normal range of motion and neck supple.  Skin:    General: Skin is warm and dry.  Neurological:     General: No focal deficit present.     Mental Status: He is alert and oriented to person, place, and time.  Psychiatric:        Mood and Affect: Mood normal.        Behavior: Behavior normal.        Thought Content: Thought content normal.        Judgment: Judgment normal.       Last metabolic panel Lab Results  Component Value Date   GLUCOSE 99 09/02/2021   NA 134 09/02/2021   K 4.3 09/02/2021   CL 97 09/02/2021   CO2 25 06/18/2021   BUN 13 09/02/2021   CREATININE 1.04 09/02/2021   EGFR 84 09/02/2021   CALCIUM 9.1 09/02/2021   PHOS 3.8 01/05/2021   PROT 7.6 09/02/2021   ALBUMIN 4.2 09/02/2021   LABGLOB 3.4 09/02/2021   AGRATIO 1.2 09/02/2021   BILITOT 0.2 09/02/2021   ALKPHOS 78 09/02/2021   AST 28 09/02/2021   ALT 33 02/04/2021   ANIONGAP 11 06/18/2021       Assessment & Plan:   Problem List Items Addressed This Visit       Cardiovascular and Mediastinum   HTN (hypertension) (Chronic)   Relevant Medications   lisinopril (ZESTRIL) 5 MG tablet   Other Visit Diagnoses     Neuropathic pain of both feet       Relevant Medications   pregabalin (LYRICA) 200 MG capsule   Type 2 diabetes mellitus with other specified complication, without long-term current use of insulin (HCC)       Relevant Medications   lisinopril (ZESTRIL) 5 MG tablet   glipiZIDE (GLUCOTROL) 5 MG tablet      1. Neuropathic pain of both feet Check of [place] controlled substance registry appropriate.  Increase Lyrica to 100 mg twice daily.  Patient strongly encouraged to not increase dosing  of medications on his own.  Red flags given for prompt reevaluation. -  pregabalin (LYRICA) 200 MG capsule; Take 1 capsule (200 mg total) by mouth 2 (two) times daily.  Dispense: 60 capsule; Refill: 1  2. Primary hypertension Continue current regimen - lisinopril (ZESTRIL) 5 MG tablet; Take 1 tablet (5 mg total) by mouth daily.  Dispense: 90 tablet; Refill: 0  3. Type 2 diabetes mellitus with other specified complication, without long-term current use of insulin (HCC) Continue current regimen - glipiZIDE (GLUCOTROL) 5 MG tablet; Take 1 tablet (5 mg total) by mouth 2 (two) times daily.  Dispense: 180 tablet; Refill: 0   I have reviewed the patient's medical history (PMH, PSH, Social History, Family History, Medications, and allergies) , and have been updated if relevant. I spent 30 minutes reviewing chart and  face to face time with patient.    Return for with Dr. Redmond Pulling at [place] 4- 6 weeks .    Loraine Grip [name], PA-C

## 2021-10-21 NOTE — Progress Notes (Signed)
Patient has eaten and taken medication today. Patient reports bilateral feet pain at an 8 currently Patient request refills on medications.

## 2021-11-16 ENCOUNTER — Other Ambulatory Visit: Payer: Self-pay

## 2021-11-19 ENCOUNTER — Other Ambulatory Visit: Payer: Self-pay

## 2021-11-20 ENCOUNTER — Other Ambulatory Visit: Payer: Self-pay

## 2021-11-30 ENCOUNTER — Other Ambulatory Visit: Payer: Self-pay

## 2021-11-30 ENCOUNTER — Encounter: Payer: Self-pay | Admitting: Family Medicine

## 2021-11-30 ENCOUNTER — Ambulatory Visit (INDEPENDENT_AMBULATORY_CARE_PROVIDER_SITE_OTHER): Payer: Self-pay | Admitting: Family Medicine

## 2021-11-30 VITALS — BP 123/83 | HR 83 | Temp 98.1°F | Resp 16 | Wt 183.6 lb

## 2021-11-30 DIAGNOSIS — G5793 Unspecified mononeuropathy of bilateral lower limbs: Secondary | ICD-10-CM

## 2021-11-30 DIAGNOSIS — H1033 Unspecified acute conjunctivitis, bilateral: Secondary | ICD-10-CM

## 2021-11-30 DIAGNOSIS — E1169 Type 2 diabetes mellitus with other specified complication: Secondary | ICD-10-CM

## 2021-11-30 DIAGNOSIS — I10 Essential (primary) hypertension: Secondary | ICD-10-CM

## 2021-11-30 MED ORDER — GABAPENTIN 600 MG PO TABS
600.0000 mg | ORAL_TABLET | Freq: Three times a day (TID) | ORAL | 1 refills | Status: DC
  Filled 2021-11-30 – 2022-01-18 (×2): qty 90, 30d supply, fill #0

## 2021-11-30 MED ORDER — SULFACETAMIDE-PREDNISOLONE 10-0.23 % OP SOLN
1.0000 [drp] | OPHTHALMIC | 0 refills | Status: DC
  Filled 2021-11-30: qty 5, 17d supply, fill #0

## 2021-12-01 ENCOUNTER — Other Ambulatory Visit: Payer: Self-pay

## 2021-12-01 MED ORDER — TOBRAMYCIN-DEXAMETHASONE 0.3-0.1 % OP SUSP
2.0000 [drp] | OPHTHALMIC | 0 refills | Status: DC
  Filled 2021-12-01: qty 5, 8d supply, fill #0

## 2021-12-01 NOTE — Progress Notes (Signed)
Established Patient Office Visit  Subjective    Patient ID: Chad Wolfe, male    DOB: 1966-01-08  Age: 56 y.o. MRN: [id number]  CC: No chief complaint on file.   HPI Chad GRASSIA presents for complaint of redness and itching of eyes. He has had sx for several days. Denies fever/chills or viral sx. Denies known exposures or contacts.    Outpatient Encounter Medications as of 11/30/2021  Medication Sig   sulfacetamide-prednisoLONE (VASOCIDIN) 10-0.23 % ophthalmic solution Place 1 drop into both eyes every 3 (three) hours while awake.   cholecalciferol (VITAMIN D3) 25 MCG (1000 UNIT) tablet Take 1,000 Units by mouth daily.   folic acid (FOLVITE) 1 MG tablet Take 1 tablet (1 mg total) by mouth daily. (Patient not taking: Reported on 07/13/2021)   gabapentin (NEURONTIN) 600 MG tablet Take 1 tablet (600 mg total) by mouth 3 (three) times daily.   glipiZIDE (GLUCOTROL) 5 MG tablet Take 1 tablet (5 mg total) by mouth 2 (two) times daily.   glucose blood (TRUE METRIX BLOOD GLUCOSE TEST) test strip Use as instructed   lisinopril (ZESTRIL) 5 MG tablet Take 1 tablet (5 mg total) by mouth once daily.   Omega-3 Fatty Acids (FISH OIL) 1000 MG CAPS Take by mouth.   potassium chloride SA (KLOR-CON) 20 MEQ tablet Take 1 tablet (20 mEq total) by mouth daily. (Patient not taking: Reported on 07/13/2021)   pregabalin (LYRICA) 200 MG capsule Take 1 capsule (200 mg total) by mouth 2 (two) times daily.   TRUEplus Lancets 28G MISC use as directed   [DISCONTINUED] gabapentin (NEURONTIN) 600 MG tablet Take 1 tablet (600 mg total) by mouth 3 (three) times daily.   No facility-administered encounter medications on file as of 11/30/2021.    Past Medical History:  Diagnosis Date   Diabetes mellitus type 2 in nonobese (HCC) 01/21/2016   Gastric ulcer    Hepatitis C    Hyperlipidemia    Hypertension    Substance abuse (HCC)     Past Surgical History:  Procedure Laterality Date   I & D EXTREMITY Right  02/26/2017   Procedure: IRRIGATION AND DEBRIDEMENT AND REDUCTION OF RIGHT MIDDLE FINGER;  Surgeon: Dominica Severin, MD;  Location: [facility] OR;  Service: Orthopedics;  Laterality: Right;   STOMACH SURGERY      Family History  Problem Relation Age of Onset   Diabetes Father    Hypertension Father    Diabetes Sister    Hypertension Sister    Diabetes Brother    Hypertension Brother     Social History   Socioeconomic History   Marital status: Divorced    Spouse name: Not on file   Number of children: Not on file   Years of education: Not on file   Highest education level: Not on file  Occupational History   Not on file  Tobacco Use   Smoking status: Never   Smokeless tobacco: Never  Substance and Sexual Activity   Alcohol use: Yes    Alcohol/week: 0.0 standard drinks of alcohol    Comment: drank a quart of beer this morning PT DENIES 09/26/15   Drug use: No    Types: Cocaine, Oxycodone    Comment: oxycodone 2 days ago "my sister gave me one"; no cocaine use   Sexual activity: Not Currently    Comment: opiates  Other Topics Concern   Not on file  Social History Narrative   Not on file   Social Determinants of Health  Financial Resource Strain: Not on file  Food Insecurity: Not on file  Transportation Needs: Not on file  Physical Activity: Not on file  Stress: Not on file  Social Connections: Not on file  Intimate Partner Violence: Not on file    Review of Systems  Constitutional:  Negative for chills and fever.  Eyes:  Positive for redness. Negative for blurred vision and discharge.  All other systems reviewed and are negative.       Objective    BP 123/83 (BP Location: Right Arm, Patient Position: Sitting, Cuff Size: Large)   Pulse 83   Temp 98.1 F (36.7 C) (Oral)   Resp 16   Wt 183 lb 9.6 oz (83.3 kg)   SpO2 93%   BMI 27.11 kg/m   Physical Exam Vitals and nursing note reviewed.  Constitutional:      General: He is not in acute distress. Eyes:      General: Lids are normal.     Conjunctiva/sclera:     Right eye: Right conjunctiva is injected. No exudate.    Left eye: Left conjunctiva is injected. No exudate. Cardiovascular:     Rate and Rhythm: Normal rate and regular rhythm.  Pulmonary:     Effort: Pulmonary effort is normal.     Breath sounds: Normal breath sounds.  Neurological:     General: No focal deficit present.     Mental Status: He is alert and oriented to person, place, and time.         Assessment & Plan:   1. Acute conjunctivitis of both eyes, unspecified acute conjunctivitis type Tobradex prescribed.   2. Neuropathic pain of both feet Appears stable. Neurontin refilled.   3. Primary hypertension Appears stable with present management. continue    No follow-ups on file.   Chad Raymond, MD

## 2021-12-03 ENCOUNTER — Encounter: Payer: Self-pay | Admitting: Family Medicine

## 2021-12-04 ENCOUNTER — Other Ambulatory Visit: Payer: Self-pay

## 2021-12-15 ENCOUNTER — Ambulatory Visit: Payer: Self-pay

## 2021-12-15 DIAGNOSIS — E1169 Type 2 diabetes mellitus with other specified complication: Secondary | ICD-10-CM

## 2021-12-15 DIAGNOSIS — G5793 Unspecified mononeuropathy of bilateral lower limbs: Secondary | ICD-10-CM

## 2021-12-15 DIAGNOSIS — I10 Essential (primary) hypertension: Secondary | ICD-10-CM

## 2021-12-15 NOTE — Telephone Encounter (Signed)
Summary: discuss medication   Patient inquiring what medication was prescribe on 6-26   Please assist further      Reason for Disposition  [1] Prescription refill request for ESSENTIAL medicine (i.e., likelihood of harm to patient if not taken) AND [2] triager unable to refill per department policy  Answer Assessment - Initial Assessment Questions 1. DRUG NAME: "What medicine do you need to have refilled?"     Lyrica, Lisinopril and Glipizide 2. REFILLS REMAINING: "How many refills are remaining?" (Note: The label on the medicine or pill bottle will show how many refills are remaining. If there are no refills remaining, then a renewal may be needed.)     Per pt no refills 3. EXPIRATION DATE: "What is the expiration date?" (Note: The label states when the prescription will expire, and thus can no longer be refilled.)      4. PRESCRIBING HCP: "Who prescribed it?" Reason: If prescribed by specialist, call should be referred to that group.     Dr. Andrey Campanile 5. SYMPTOMS: "Do you have any symptoms?"      6. PREGNANCY: "Is there any chance that you are pregnant?" "When was your last menstrual period?"     na  Protocols used: Medication Refill and Renewal Call-A-AH

## 2021-12-16 NOTE — Telephone Encounter (Signed)
Requested medication (s) are due for refill today- yes  Requested medication (s) are on the active medication list -yes  Future visit scheduled -no  Last refill: 10/21/21  Notes to clinic: all mobile provider filled- Glipizide and lisinopril not due- 10/21/21 #180 and #90, pregabalin due- non delegated  Requested Prescriptions  Pending Prescriptions Disp Refills   glipiZIDE (GLUCOTROL) 5 MG tablet 180 tablet 0    Sig: Take 1 tablet (5 mg total) by mouth 2 (two) times daily.     Endocrinology:  Diabetes - Sulfonylureas Passed - 12/15/2021  8:54 AM      Passed - HBA1C is between 0 and 7.9 and within 180 days    Hemoglobin A1C  Date Value Ref Range Status  09/02/2021 7.2 (A) 4.0 - 5.6 % Final   Hgb A1c MFr Bld  Date Value Ref Range Status  01/04/2021 11.5 (H) 4.8 - 5.6 % Final    Comment:    (NOTE) Pre diabetes:          5.7%-6.4%  Diabetes:              >6.4%  Glycemic control for   <7.0% adults with diabetes          Passed - Cr in normal range and within 360 days    Creat  Date Value Ref Range Status  09/02/2014 0.90 0.50 - 1.35 mg/dL Final   Creatinine, Ser  Date Value Ref Range Status  09/02/2021 1.04 0.76 - 1.27 mg/dL Final         Passed - Valid encounter within last 6 months    Recent Outpatient Visits           2 weeks ago Acute conjunctivitis of both eyes, unspecified acute conjunctivitis type   Primary Care at [facility], Clyde Canterbury, MD   1 month ago Neuropathic pain of both feet   Primary Care at [facility], [name], PA-C   3 months ago Neuropathic pain of both feet   Primary Care at [facility], [name], PA-C   5 months ago Type 2 diabetes mellitus with other specified complication, without long-term current use of insulin ([place])   Primary Care at [place], MD   6 months ago Neuropathic pain of both feet   Primary Care at [facility], Amy J, NP               lisinopril (ZESTRIL) 5 MG  tablet 90 tablet 0    Sig: Take 1 tablet (5 mg total) by mouth once daily.     Cardiovascular:  ACE Inhibitors Passed - 12/15/2021  8:54 AM      Passed - Cr in normal range and within 180 days    Creat  Date Value Ref Range Status  09/02/2014 0.90 0.50 - 1.35 mg/dL Final   Creatinine, Ser  Date Value Ref Range Status  09/02/2021 1.04 0.76 - 1.27 mg/dL Final         Passed - K in normal range and within 180 days    Potassium  Date Value Ref Range Status  09/02/2021 4.3 3.5 - 5.2 mmol/L Final         Passed - Patient is not pregnant      Passed - Last BP in normal range    BP Readings from Last 1 Encounters:  11/30/21 123/83         Passed - Valid encounter within last 6 months  Recent Outpatient Visits           2 weeks ago Acute conjunctivitis of both eyes, unspecified acute conjunctivitis type   Primary Care at [facility], Lauris Poag, MD   1 month ago Neuropathic pain of both feet   Primary Care at [facility], [name], PA-C   3 months ago Neuropathic pain of both feet   Primary Care at [facility], [name], PA-C   5 months ago Type 2 diabetes mellitus with other specified complication, without long-term current use of insulin [facility])   Primary Care at [facility], MD   6 months ago Neuropathic pain of both feet   Primary Care at [facility], Amy J, NP               pregabalin (LYRICA) 200 MG capsule 60 capsule 1    Sig: Take 1 capsule (200 mg total) by mouth 2 (two) times daily.     Not Delegated - Neurology:  Anticonvulsants - Controlled - pregabalin Failed - 12/15/2021  8:54 AM      Failed - This refill cannot be delegated      Passed - Cr in normal range and within 360 days    Creat  Date Value Ref Range Status  09/02/2014 0.90 0.50 - 1.35 mg/dL Final   Creatinine, Ser  Date Value Ref Range Status  09/02/2021 1.04 0.76 - 1.27 mg/dL Final         Passed - Completed PHQ-2 or PHQ-9 in the last 360  days      Passed - Valid encounter within last 12 months    Recent Outpatient Visits           2 weeks ago Acute conjunctivitis of both eyes, unspecified acute conjunctivitis type   Primary Care at [facility], Lauris Poag, MD   1 month ago Neuropathic pain of both feet   Primary Care at [facility], [name], PA-C   3 months ago Neuropathic pain of both feet   Primary Care at [facility], [name], PA-C   5 months ago Type 2 diabetes mellitus with other specified complication, without long-term current use of insulin [facility])   Primary Care at [facility], Lauris Poag, MD   6 months ago Neuropathic pain of both feet   Primary Care at [facility], Amy J, NP                 Requested Prescriptions  Pending Prescriptions Disp Refills   glipiZIDE (GLUCOTROL) 5 MG tablet 180 tablet 0    Sig: Take 1 tablet (5 mg total) by mouth 2 (two) times daily.     Endocrinology:  Diabetes - Sulfonylureas Passed - 12/15/2021  8:54 AM      Passed - HBA1C is between 0 and 7.9 and within 180 days    Hemoglobin A1C  Date Value Ref Range Status  09/02/2021 7.2 (A) 4.0 - 5.6 % Final   Hgb A1c MFr Bld  Date Value Ref Range Status  01/04/2021 11.5 (H) 4.8 - 5.6 % Final    Comment:    (NOTE) Pre diabetes:          5.7%-6.4%  Diabetes:              >6.4%  Glycemic control for   <7.0% adults with diabetes          Passed - Cr in normal range and within 360 days  Creat  Date Value Ref Range Status  09/02/2014 0.90 0.50 - 1.35 mg/dL Final   Creatinine, Ser  Date Value Ref Range Status  09/02/2021 1.04 0.76 - 1.27 mg/dL Final         Passed - Valid encounter within last 6 months    Recent Outpatient Visits           2 weeks ago Acute conjunctivitis of both eyes, unspecified acute conjunctivitis type   Primary Care at [facility], Lauris Poag, MD   1 month ago Neuropathic pain of both feet   Primary Care at [facility], [name],  PA-C   3 months ago Neuropathic pain of both feet   Primary Care at [facility], [name], PA-C   5 months ago Type 2 diabetes mellitus with other specified complication, without long-term current use of insulin [facility])   Primary Care at [facility], MD   6 months ago Neuropathic pain of both feet   Primary Care at [facility], Amy J, NP               lisinopril (ZESTRIL) 5 MG tablet 90 tablet 0    Sig: Take 1 tablet (5 mg total) by mouth once daily.     Cardiovascular:  ACE Inhibitors Passed - 12/15/2021  8:54 AM      Passed - Cr in normal range and within 180 days    Creat  Date Value Ref Range Status  09/02/2014 0.90 0.50 - 1.35 mg/dL Final   Creatinine, Ser  Date Value Ref Range Status  09/02/2021 1.04 0.76 - 1.27 mg/dL Final         Passed - K in normal range and within 180 days    Potassium  Date Value Ref Range Status  09/02/2021 4.3 3.5 - 5.2 mmol/L Final         Passed - Patient is not pregnant      Passed - Last BP in normal range    BP Readings from Last 1 Encounters:  11/30/21 123/83         Passed - Valid encounter within last 6 months    Recent Outpatient Visits           2 weeks ago Acute conjunctivitis of both eyes, unspecified acute conjunctivitis type   Primary Care at [facility], MD   1 month ago Neuropathic pain of both feet   Primary Care at [facility], [name], PA-C   3 months ago Neuropathic pain of both feet   Primary Care at [facility], [name], PA-C   5 months ago Type 2 diabetes mellitus with other specified complication, without long-term current use of insulin [facility])   Primary Care at [facility], Lauris Poag, MD   6 months ago Neuropathic pain of both feet   Primary Care at [facility], Amy J, NP               pregabalin (LYRICA) 200 MG capsule 60 capsule 1    Sig: Take 1 capsule (200 mg total) by mouth 2 (two) times daily.     Not  Delegated - Neurology:  Anticonvulsants - Controlled - pregabalin Failed - 12/15/2021  8:54 AM      Failed - This refill cannot be delegated      Passed - Cr in normal range and within 360 days    Creat  Date Value Ref Range Status  09/02/2014 0.90 0.50 - 1.35 mg/dL Final   Creatinine, Ser  Date Value Ref Range Status  09/02/2021 1.04 0.76 - 1.27 mg/dL Final         Passed - Completed PHQ-2 or PHQ-9 in the last 360 days      Passed - Valid encounter within last 12 months    Recent Outpatient Visits           2 weeks ago Acute conjunctivitis of both eyes, unspecified acute conjunctivitis type   Primary Care at [facility], Clyde Canterbury, MD   1 month ago Neuropathic pain of both feet   Primary Care at [facility], [name], PA-C   3 months ago Neuropathic pain of both feet   Primary Care at [facility], [name], PA-C   5 months ago Type 2 diabetes mellitus with other specified complication, without long-term current use of insulin [facility])   Primary Care at [facility], MD   6 months ago Neuropathic pain of both feet   Primary Care at [facility], Flonnie Hailstone, NP

## 2021-12-17 ENCOUNTER — Other Ambulatory Visit: Payer: Self-pay | Admitting: Physician Assistant

## 2021-12-17 ENCOUNTER — Other Ambulatory Visit: Payer: Self-pay

## 2021-12-17 DIAGNOSIS — G5793 Unspecified mononeuropathy of bilateral lower limbs: Secondary | ICD-10-CM

## 2021-12-18 ENCOUNTER — Other Ambulatory Visit: Payer: Self-pay

## 2021-12-18 ENCOUNTER — Other Ambulatory Visit: Payer: Self-pay | Admitting: Family Medicine

## 2021-12-18 DIAGNOSIS — G5793 Unspecified mononeuropathy of bilateral lower limbs: Secondary | ICD-10-CM

## 2021-12-18 NOTE — Telephone Encounter (Signed)
Over due Rx request-rerouted to PCP office

## 2021-12-18 NOTE — Telephone Encounter (Signed)
Requested medication (s) are due for refill today: see note  Requested medication (s) are on the active medication list: yes  Last refill:  10/21/21 #60 1 refills  Future visit scheduled: no  Notes to clinic:  not delegated per protocol. To pharmacy: Dose change, please allow early fill due to dose change.      Requested Prescriptions  Pending Prescriptions Disp Refills   pregabalin (LYRICA) 200 MG capsule 60 capsule 1    Sig: Take 1 capsule (200 mg total) by mouth 2 (two) times daily.     Not Delegated - Neurology:  Anticonvulsants - Controlled - pregabalin Failed - 12/18/2021  4:24 PM      Failed - This refill cannot be delegated      Passed - Cr in normal range and within 360 days    Creat  Date Value Ref Range Status  09/02/2014 0.90 0.50 - 1.35 mg/dL Final   Creatinine, Ser  Date Value Ref Range Status  09/02/2021 1.04 0.76 - 1.27 mg/dL Final         Passed - Completed PHQ-2 or PHQ-9 in the last 360 days      Passed - Valid encounter within last 12 months    Recent Outpatient Visits           2 weeks ago Acute conjunctivitis of both eyes, unspecified acute conjunctivitis type   Primary Care at [facility], Lauris Poag, MD   1 month ago Neuropathic pain of both feet   Primary Care at [facility], [name], PA-C   3 months ago Neuropathic pain of both feet   Primary Care at [facility], [name], PA-C   5 months ago Type 2 diabetes mellitus with other specified complication, without long-term current use of insulin [facility])   Primary Care at [facility], MD   6 months ago Neuropathic pain of both feet   Primary Care at [facility], Salomon Fick, NP

## 2021-12-21 ENCOUNTER — Other Ambulatory Visit: Payer: Self-pay

## 2021-12-21 NOTE — Telephone Encounter (Signed)
Requested medication (s) are due for refill today: Yes  Requested medication (s) are on the active medication list: Yes  Last refill:  10/21/21  Future visit scheduled: Yes  Notes to clinic:  See request.    Requested Prescriptions  Pending Prescriptions Disp Refills   pregabalin (LYRICA) 200 MG capsule 60 capsule 1    Sig: Take 1 capsule (200 mg total) by mouth 2 (two) times daily.     Not Delegated - Neurology:  Anticonvulsants - Controlled - pregabalin Failed - 12/21/2021 10:31 AM      Failed - This refill cannot be delegated      Passed - Cr in normal range and within 360 days    Creat  Date Value Ref Range Status  09/02/2014 0.90 0.50 - 1.35 mg/dL Final   Creatinine, Ser  Date Value Ref Range Status  09/02/2021 1.04 0.76 - 1.27 mg/dL Final         Passed - Completed PHQ-2 or PHQ-9 in the last 360 days      Passed - Valid encounter within last 12 months    Recent Outpatient Visits           3 weeks ago Acute conjunctivitis of both eyes, unspecified acute conjunctivitis type   Primary Care at [facility], Lauris Poag, MD   2 months ago Neuropathic pain of both feet   Primary Care at [facility], [name], PA-C   3 months ago Neuropathic pain of both feet   Primary Care at [facility], [name], PA-C   5 months ago Type 2 diabetes mellitus with other specified complication, without long-term current use of insulin [facility])   Primary Care at [facility], MD   6 months ago Neuropathic pain of both feet   Primary Care at [facility], Salomon Fick, NP       Future Appointments             Tomorrow [name], [name], PA-C Primary Care at [facility]

## 2021-12-21 NOTE — Telephone Encounter (Signed)
Pt called reporting that he is completely out of his current supply. Pt says that he was told that Dr. Andrey Campanile wants Cari [name] to refill this so he went to the mobile bus on Saturday but was told that she could not see him until this Tuesday. He wants to know why his PCP will not fill this for him?

## 2021-12-22 ENCOUNTER — Ambulatory Visit (INDEPENDENT_AMBULATORY_CARE_PROVIDER_SITE_OTHER): Payer: Self-pay | Admitting: Physician Assistant

## 2021-12-22 ENCOUNTER — Encounter: Payer: Self-pay | Admitting: Physician Assistant

## 2021-12-22 ENCOUNTER — Other Ambulatory Visit: Payer: Self-pay

## 2021-12-22 VITALS — BP 153/95 | HR 69 | Resp 18 | Ht 69.0 in | Wt 186.0 lb

## 2021-12-22 DIAGNOSIS — I10 Essential (primary) hypertension: Secondary | ICD-10-CM

## 2021-12-22 DIAGNOSIS — G5793 Unspecified mononeuropathy of bilateral lower limbs: Secondary | ICD-10-CM

## 2021-12-22 MED ORDER — PREGABALIN 200 MG PO CAPS
200.0000 mg | ORAL_CAPSULE | Freq: Two times a day (BID) | ORAL | 1 refills | Status: DC
  Filled 2021-12-22: qty 60, 30d supply, fill #0
  Filled 2022-01-18: qty 60, 30d supply, fill #1

## 2021-12-22 NOTE — Progress Notes (Unsigned)
   Established Patient Office Visit  Subjective   Patient ID: Chad Wolfe, male    DOB: [birth year]  Age: 56 y.o. MRN: [id number]  Chief Complaint  Patient presents with   Medication Refill    Out of Lyrica    Patient states that he has been out of his Lyrica for the past 2 days.  States that it has been offering relief from his pain in his feet.  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  States that he does check his blood pressure at home on occasion, however is unable to give numerical readings.  Denies any hypertensive symptoms.    {History (Optional):23778}  ROS    Objective:     BP (!) 153/95 (BP Location: Right Arm, Patient Position: Sitting, Cuff Size: Normal)   Pulse 69   Resp 18   Ht 5\' 9"  (1.753 m)   Wt 186 lb (84.4 kg)   SpO2 96%   BMI 27.47 kg/m  {Vitals History (Optional):23777}  Physical Exam   No results found for any visits on 12/22/21.  {Labs (Optional):23779}  The ASCVD Risk score (Arnett DK, et al., 2019) failed to calculate for the following reasons:   Cannot find a previous HDL lab   Cannot find a previous total cholesterol lab    Assessment & Plan:   Problem List Items Addressed This Visit       Cardiovascular and Mediastinum   HTN (hypertension) (Chronic)   Other Visit Diagnoses     Elevated blood pressure reading with diagnosis of hypertension    -  Primary   Neuropathic pain of both feet       Relevant Medications   pregabalin (LYRICA) 200 MG capsule      1. Neuropathic pain of both feet Check of 2020 controlled substance registry appropriate.  Patient given appointment to establish care with [place].  Patient encouraged to follow-up with mobile medicine as needed.  Red flags given for prompt reevaluation - pregabalin (LYRICA) 200 MG capsule; Take 1 capsule (200 mg total) by mouth 2 (two) times daily.  Dispense: 60  capsule; Refill: 1  2. Primary hypertension Encouraged to check blood pressure at home on a daily basis, keep a written log and have available for all office visits.  3. Elevated blood pressure reading with diagnosis of hypertension     I have reviewed the patient's medical history (PMH, PSH, Social History, Family History, Medications, and allergies) , and have been updated if relevant. I spent 20 minutes reviewing chart and  face to face time with patient.    Return in about 4 weeks (around 01/19/2022) for To establish PCP, with 01/21/2022, NP at Primary Care at [facility].    [facility] [name], PA-C

## 2021-12-22 NOTE — Patient Instructions (Signed)
I sent a refill of your Lyrica to your pharmacy.  Your blood pressure is elevated today, I encourage you to check your blood pressure at home on a daily basis, keep a written log and have available for all office visits.  You will follow-up with Delfin Gant in 1 month for diabetes, Lyrica  / gabapentin review.  Please let us know if there is anything else we can do for you.  Roney Jaffe, PA-C Physician Assistant [facility] Medicine [email or web address]   How to Take Your Blood Pressure Blood pressure is a measurement of how strongly your blood is pressing against the walls of your arteries. Arteries are blood vessels that carry blood from your heart throughout your body. Your health care provider takes your blood pressure at each office visit. You can also take your own blood pressure at home with a blood pressure monitor. You may need to take your own blood pressure to: Confirm a diagnosis of high blood pressure (hypertension). Monitor your blood pressure over time. Make sure your blood pressure medicine is working. Supplies needed: Blood pressure monitor. A chair to sit in. This should be a chair where you can sit upright with your back supported. Do not sit on a soft couch or an armchair. Table or desk. Small notebook and pencil or pen. How to prepare To get the most accurate reading, avoid the following for 30 minutes before you check your blood pressure: Drinking caffeine. Drinking alcohol. Eating. Smoking. Exercising. Five minutes before you check your blood pressure: Use the bathroom and urinate so that you have an empty bladder. Sit quietly in a chair. Do not talk. How to take your blood pressure To check your blood pressure, follow the instructions in the manual that came with your blood pressure monitor. If you have a digital blood pressure monitor, the instructions may be as follows: Sit up straight in a chair. Place  your feet on the floor. Do not cross your ankles or legs. Rest your left arm at the level of your heart on a table or desk or on the arm of a chair. Pull up your shirt sleeve. Wrap the blood pressure cuff around the upper part of your left arm, 1 inch (2.5 cm) above your elbow. It is best to wrap the cuff around bare skin. Fit the cuff snugly, but not too tightly, around your arm. You should be able to place only one finger between the cuff and your arm. Position the cord so that it rests in the bend of your elbow. Press the power button. Sit quietly while the cuff inflates and deflates. Read the digital reading on the monitor screen and write the numbers down (record them) in a notebook. Wait 2-3 minutes, then repeat the steps, starting at step 1. What does my blood pressure reading mean? A blood pressure reading consists of a higher number over a lower number. Ideally, your blood pressure should be below 120/80. The first ("top") number is called the systolic pressure. It is a measure of the pressure in your arteries as your heart beats. The second ("bottom") number is called the diastolic pressure. It is a measure of the pressure in your arteries as the heart relaxes. Blood pressure is classified into four stages. The following are the stages for adults who do not have a short-term serious illness or a chronic condition. Systolic pressure and diastolic pressure are measured in a unit called mm Hg (millimeters of mercury).  Normal Systolic pressure: below 120.  Diastolic pressure: below 80. Elevated Systolic pressure: 120-129. Diastolic pressure: below 80. Hypertension stage 1 Systolic pressure: 130-139. Diastolic pressure: 80-89. Hypertension stage 2 Systolic pressure: 140 or above. Diastolic pressure: 90 or above. You can have elevated blood pressure or hypertension even if only the systolic or only the diastolic number in your reading is higher than normal. Follow these instructions at  home: Medicines Take over-the-counter and prescription medicines only as told by your health care provider. Tell your health care provider if you are having any side effects from blood pressure medicine. General instructions Check your blood pressure as often as recommended by your health care provider. Check your blood pressure at the same time every day. Take your monitor to the next appointment with your health care provider to make sure that: You are using it correctly. It provides accurate readings. Understand what your goal blood pressure numbers are. Keep all follow-up visits. This is important. General tips Your health care provider can suggest a reliable monitor that will meet your needs. There are several types of home blood pressure monitors. Choose a monitor that has an arm cuff. Do not choose a monitor that measures your blood pressure from your wrist or finger. Choose a cuff that wraps snugly, not too tight or too loose, around your upper arm. You should be able to fit only one finger between your arm and the cuff. You can buy a blood pressure monitor at most drugstores or online. Where to find more information American Heart Association: www.heart.org Contact a health care provider if: Your blood pressure is consistently high. Your blood pressure is suddenly low. Get help right away if: Your systolic blood pressure is higher than 180. Your diastolic blood pressure is higher than 120. These symptoms may be an emergency. Get help right away. Call 911. Do not wait to see if the symptoms will go away. Do not drive yourself to the hospital. Summary Blood pressure is a measurement of how strongly your blood is pressing against the walls of your arteries. A blood pressure reading consists of a higher number over a lower number. Ideally, your blood pressure should be below 120/80. Check your blood pressure at the same time every day. Avoid caffeine, alcohol, smoking, and  exercise for 30 minutes prior to checking your blood pressure. These agents can affect the accuracy of the blood pressure reading. This information is not intended to replace advice given to you by your health care provider. Make sure you discuss any questions you have with your health care provider. Document Revised: 02/05/2021 Document Reviewed: 02/05/2021 Elsevier Patient Education  2023 [employer].

## 2022-01-11 NOTE — Progress Notes (Signed)
Patient ID: Chad Wolfe, male    DOB: Feb 08, 1966  MRN: [id number]  CC: Chronic Care Management   Subjective: Chad Wolfe is a 56 y.o. male who presents for chronic care management.  His concerns today include:  Doing well on chronic medications without issues or concerns. Checking blood pressures at home intermittently. Adds salt to food but trying to cut back. Reports eating a lot of southern home cooked foods prepared by his sister. Eating fast foods almost daily on lunch breaks at work. Smoking 2 cigarettes weekly. Denies chest pain, shortness of breath, and additional red flag symptoms.   Patient Active Problem List   Diagnosis Date Noted   Uninsured 02/04/2021   Transaminitis 02/04/2021   BMI 22.0-22.9, adult 01/15/2021   DKA (diabetic ketoacidosis) (HCC) 01/05/2021   DKA, type 2 (HCC) 01/04/2021   Open finger dislocation 02/26/2017   Diabetes mellitus type 2 in nonobese (HCC) 01/21/2016   Fall from chair 05/15/2015   Alcohol abuse 05/05/2015   Pain, dental 03/27/2013   HTN (hypertension) 04/25/2012   Chronic hepatitis C virus infection (HCC) 11/15/2007   ABDOMINAL PAIN-EPIGASTRIC 11/15/2007     Current Outpatient Medications on File Prior to Visit  Medication Sig Dispense Refill   cholecalciferol (VITAMIN D3) 25 MCG (1000 UNIT) tablet Take 1,000 Units by mouth daily. (Patient not taking: Reported on 12/22/2021)     gabapentin (NEURONTIN) 600 MG tablet Take 1 tablet (600 mg total) by mouth 3 (three) times daily. 90 tablet 1   glucose blood (TRUE METRIX BLOOD GLUCOSE TEST) test strip Use as instructed 100 each 12   Omega-3 Fatty Acids (FISH OIL) 1000 MG CAPS Take by mouth.     pregabalin (LYRICA) 200 MG capsule Take 1 capsule (200 mg total) by mouth 2 (two) times daily. 60 capsule 1   tobramycin-dexamethasone (TOBRADEX) ophthalmic solution Place 2 drops into both eyes every 4 (four) hours while awake. (Patient not taking: Reported on 12/22/2021) 5 mL 0   TRUEplus  Lancets 28G MISC use as directed 100 each 12   No current facility-administered medications on file prior to visit.    Allergies  Allergen Reactions   Ibuprofen Other (See Comments)    Stomach upset   Aspirin Other (See Comments)   Other Other (See Comments)    MD told him not to take [facility] due to Hep C   Tylenol [Acetaminophen] Other (See Comments)    HEP C    Social History   Socioeconomic History   Marital status: Divorced    Spouse name: Not on file   Number of children: Not on file   Years of education: Not on file   Highest education level: Not on file  Occupational History   Not on file  Tobacco Use   Smoking status: Some Days    Types: Cigarettes    Passive exposure: Current   Smokeless tobacco: Never  Substance and Sexual Activity   Alcohol use: Yes    Alcohol/week: 0.0 standard drinks of alcohol    Comment: drank a quart of beer this morning PT DENIES 09/26/15   Drug use: No    Types: Cocaine, Oxycodone    Comment: oxycodone 2 days ago "my sister gave me one"; no cocaine use   Sexual activity: Not Currently    Comment: opiates  Other Topics Concern   Not on file  Social History Narrative   Not on file   Social Determinants of Health   Financial Resource Strain: Not on  file  Food Insecurity: Not on file  Transportation Needs: Not on file  Physical Activity: Not on file  Stress: Not on file  Social Connections: Not on file  Intimate Partner Violence: Not on file    Family History  Problem Relation Age of Onset   Diabetes Father    Hypertension Father    Diabetes Sister    Hypertension Sister    Diabetes Brother    Hypertension Brother     Past Surgical History:  Procedure Laterality Date   I & D EXTREMITY Right 02/26/2017   Procedure: IRRIGATION AND DEBRIDEMENT AND REDUCTION OF RIGHT MIDDLE FINGER;  Surgeon: Roseanne Kaufman, MD;  Location: [facility];  Service: Orthopedics;  Laterality: Right;   STOMACH SURGERY      ROS: Review of  Systems Negative except as stated above  PHYSICAL EXAM: BP 112/72 (BP Location: Left Arm, Patient Position: Sitting, Cuff Size: Normal)   Pulse 69   Temp 98.3 F (36.8 C)   Resp 16   Ht 5' 9.02" (1.753 m)   Wt 186 lb (84.4 kg)   SpO2 95%   BMI 27.45 kg/m   Physical Exam HENT:     Head: Normocephalic and atraumatic.  Eyes:     Extraocular Movements: Extraocular movements intact.     Conjunctiva/sclera: Conjunctivae normal.     Pupils: Pupils are equal, round, and reactive to light.  Cardiovascular:     Rate and Rhythm: Normal rate and regular rhythm.     Pulses: Normal pulses.     Heart sounds: Normal heart sounds.  Pulmonary:     Effort: Pulmonary effort is normal.     Breath sounds: Normal breath sounds.  Musculoskeletal:     Cervical back: Normal range of motion and neck supple.  Neurological:     General: No focal deficit present.     Mental Status: He is alert and oriented to person, place, and time.  Psychiatric:        Mood and Affect: Mood normal.        Behavior: Behavior normal.   Results for orders placed or performed in visit on 01/19/22  POCT glycosylated hemoglobin (Hb A1C)  Result Value Ref Range   Hemoglobin A1C 6.9 (A) 4.0 - 5.6 %   HbA1c POC (<> result, manual entry)     HbA1c, POC (prediabetic range)     HbA1c, POC (controlled diabetic range)      ASSESSMENT AND PLAN: 1. Type 2 diabetes mellitus with diabetic neuropathy, without long-term current use of insulin (HCC) - Hemoglobin A1c at goal 6.9%, goal < 7%.  - Continue Glipizide as prescribed.  - Discussed the importance of healthy eating habits, low-carbohydrate diet, low-sugar diet, regular aerobic exercise (at least 150 minutes a week as tolerated) and medication compliance to achieve or maintain control of diabetes. - Follow-up with primary provider in 6 months or sooner if needed.  - POCT glycosylated hemoglobin (Hb A1C) - glipiZIDE (GLUCOTROL) 5 MG tablet; Take 1 tablet (5 mg total) by  mouth 2 (two) times daily.  Dispense: 60 tablet; Refill: 5  2. Diabetic polyneuropathy associated with type 2 diabetes mellitus (HCC) - Continue Gabapentin and Pregabalin as prescribed.  - Discussed with patient Pregabalin will be refilled on monthly basis due to being a controlled substance. Patient should request refills monthly through pharmacy or call our office and leave message. Patient verbalized understanding.  - I did check the [facility] prescription drug database and found no frequent prescribers of opiates  or evidence of aberrant behavior. - Follow-up with primary provider in 6 months or sooner if needed.  - gabapentin (NEURONTIN) 600 MG tablet; Take 1 tablet (600 mg total) by mouth 3 (three) times daily.  Dispense: 90 tablet; Refill: 5 - pregabalin (LYRICA) 200 MG capsule; Take 1 capsule (200 mg total) by mouth 2 (two) times daily.  Dispense: 60 capsule; Refill: 0  3. Primary hypertension - Continue Lisinopril as prescribed.  - Counseled on blood pressure goal of less than 130/80, low-sodium, DASH diet, medication compliance, and 150 minutes of moderate intensity exercise per week as tolerated. Counseled on medication adherence and adverse effects. - CMP14+EGFR to check kidney function, liver function, and electrolyte balance.  - Follow-up with primary provider in 6 months or sooner if needed.  - lisinopril (ZESTRIL) 5 MG tablet; Take 1 tablet (5 mg total) by mouth once daily.  Dispense: 30 tablet; Refill: 5 - CMP14+EGFR   Patient was given the opportunity to ask questions.  Patient verbalized understanding of the plan and was able to repeat key elements of the plan. Patient was given clear instructions to go to Emergency Department or return to medical center if symptoms don't improve, worsen, or new problems develop.The patient verbalized understanding.   Orders Placed This Encounter  Procedures   CMP14+EGFR   POCT glycosylated hemoglobin (Hb A1C)     Requested  Prescriptions   Pending Prescriptions Disp Refills   gabapentin (NEURONTIN) 600 MG tablet 90 tablet 5    Sig: Take 1 tablet (600 mg total) by mouth 3 (three) times daily.   Signed Prescriptions Disp Refills   lisinopril (ZESTRIL) 5 MG tablet 30 tablet 5    Sig: Take 1 tablet (5 mg total) by mouth once daily.   glipiZIDE (GLUCOTROL) 5 MG tablet 60 tablet 5    Sig: Take 1 tablet (5 mg total) by mouth 2 (two) times daily.    Return in about 6 months (around 07/22/2022) for Follow-Up or next available chronic care mgmt.  Camillia Herter, NP

## 2022-01-18 ENCOUNTER — Other Ambulatory Visit: Payer: Self-pay

## 2022-01-19 ENCOUNTER — Other Ambulatory Visit: Payer: Self-pay

## 2022-01-19 ENCOUNTER — Ambulatory Visit (INDEPENDENT_AMBULATORY_CARE_PROVIDER_SITE_OTHER): Payer: Self-pay | Admitting: Family

## 2022-01-19 ENCOUNTER — Encounter: Payer: Self-pay | Admitting: Family

## 2022-01-19 VITALS — BP 112/72 | HR 69 | Temp 98.3°F | Resp 16 | Ht 69.02 in | Wt 186.0 lb

## 2022-01-19 DIAGNOSIS — E1142 Type 2 diabetes mellitus with diabetic polyneuropathy: Secondary | ICD-10-CM

## 2022-01-19 DIAGNOSIS — I10 Essential (primary) hypertension: Secondary | ICD-10-CM

## 2022-01-19 DIAGNOSIS — E1140 Type 2 diabetes mellitus with diabetic neuropathy, unspecified: Secondary | ICD-10-CM

## 2022-01-19 LAB — POCT GLYCOSYLATED HEMOGLOBIN (HGB A1C): Hemoglobin A1C: 6.9 % — AB (ref 4.0–5.6)

## 2022-01-19 MED ORDER — GLIPIZIDE 5 MG PO TABS
5.0000 mg | ORAL_TABLET | Freq: Two times a day (BID) | ORAL | 5 refills | Status: DC
  Filled 2022-01-19 – 2022-02-23 (×2): qty 60, 30d supply, fill #0
  Filled 2022-03-27: qty 60, 30d supply, fill #1
  Filled 2022-04-27: qty 60, 30d supply, fill #2
  Filled 2022-05-27: qty 60, 30d supply, fill #3
  Filled 2022-06-26: qty 60, 30d supply, fill #4
  Filled 2022-07-26: qty 60, 30d supply, fill #5

## 2022-01-19 MED ORDER — LISINOPRIL 5 MG PO TABS
5.0000 mg | ORAL_TABLET | Freq: Every day | ORAL | 5 refills | Status: DC
  Filled 2022-01-19 – 2022-02-15 (×2): qty 30, 30d supply, fill #0
  Filled 2022-03-10: qty 30, 30d supply, fill #1
  Filled 2022-04-10: qty 30, 30d supply, fill #2

## 2022-01-19 MED ORDER — PREGABALIN 200 MG PO CAPS
200.0000 mg | ORAL_CAPSULE | Freq: Two times a day (BID) | ORAL | 0 refills | Status: DC
  Filled 2022-01-19: qty 60, 30d supply, fill #0

## 2022-01-19 MED ORDER — GABAPENTIN 600 MG PO TABS
600.0000 mg | ORAL_TABLET | Freq: Three times a day (TID) | ORAL | 5 refills | Status: DC
  Filled 2022-01-19: qty 74, 25d supply, fill #0
  Filled 2022-03-10: qty 90, 30d supply, fill #0

## 2022-01-19 NOTE — Patient Instructions (Signed)
Hypertension, Adult High blood pressure (hypertension) is when the force of blood pumping through the arteries is too strong. The arteries are the blood vessels that carry blood from the heart throughout the body. Hypertension forces the heart to work harder to pump blood and may cause arteries to become narrow or stiff. Untreated or uncontrolled hypertension can lead to a heart attack, heart failure, a stroke, kidney disease, and other problems. A blood pressure reading consists of a higher number over a lower number. Ideally, your blood pressure should be below 120/80. The first ("top") number is called the systolic pressure. It is a measure of the pressure in your arteries as your heart beats. The second ("bottom") number is called the diastolic pressure. It is a measure of the pressure in your arteries as the heart relaxes. What are the causes? The exact cause of this condition is not known. There are some conditions that result in high blood pressure. What increases the risk? Certain factors may make you more likely to develop high blood pressure. Some of these risk factors are under your control, including: Smoking. Not getting enough exercise or physical activity. Being overweight. Having too much fat, sugar, calories, or salt (sodium) in your diet. Drinking too much alcohol. Other risk factors include: Having a personal history of heart disease, diabetes, high cholesterol, or kidney disease. Stress. Having a family history of high blood pressure and high cholesterol. Having obstructive sleep apnea. Age. The risk increases with age. What are the signs or symptoms? High blood pressure may not cause symptoms. Very high blood pressure (hypertensive crisis) may cause: Headache. Fast or irregular heartbeats (palpitations). Shortness of breath. Nosebleed. Nausea and vomiting. Vision changes. Severe chest pain, dizziness, and seizures. How is this diagnosed? This condition is diagnosed by  measuring your blood pressure while you are seated, with your arm resting on a flat surface, your legs uncrossed, and your feet flat on the floor. The cuff of the blood pressure monitor will be placed directly against the skin of your upper arm at the level of your heart. Blood pressure should be measured at least twice using the same arm. Certain conditions can cause a difference in blood pressure between your right and left arms. If you have a high blood pressure reading during one visit or you have normal blood pressure with other risk factors, you may be asked to: Return on a different day to have your blood pressure checked again. Monitor your blood pressure at home for 1 week or longer. If you are diagnosed with hypertension, you may have other blood or imaging tests to help your health care provider understand your overall risk for other conditions. How is this treated? This condition is treated by making healthy lifestyle changes, such as eating healthy foods, exercising more, and reducing your alcohol intake. You may be referred for counseling on a healthy diet and physical activity. Your health care provider may prescribe medicine if lifestyle changes are not enough to get your blood pressure under control and if: Your systolic blood pressure is above 130. Your diastolic blood pressure is above 80. Your personal target blood pressure may vary depending on your medical conditions, your age, and other factors. Follow these instructions at home: Eating and drinking  Eat a diet that is high in fiber and potassium, and low in sodium, added sugar, and fat. An example of this eating plan is called the DASH diet. DASH stands for Dietary Approaches to Stop Hypertension. To eat this way: Eat   plenty of fresh fruits and vegetables. Try to fill one half of your plate at each meal with fruits and vegetables. Eat whole grains, such as whole-wheat pasta, brown rice, or whole-grain bread. Fill about one  fourth of your plate with whole grains. Eat or drink low-fat dairy products, such as skim milk or low-fat yogurt. Avoid fatty cuts of meat, processed or cured meats, and poultry with skin. Fill about one fourth of your plate with lean proteins, such as fish, chicken without skin, beans, eggs, or tofu. Avoid pre-made and processed foods. These tend to be higher in sodium, added sugar, and fat. Reduce your daily sodium intake. Many people with hypertension should eat less than 1,500 mg of sodium a day. Do not drink alcohol if: Your health care provider tells you not to drink. You are pregnant, may be pregnant, or are planning to become pregnant. If you drink alcohol: Limit how much you have to: 0-1 drink a day for women. 0-2 drinks a day for men. Know how much alcohol is in your drink. In the [place] one drink equals one 12 oz bottle of beer (355 mL), one 5 oz glass of wine (148 mL), or one 1 oz glass of hard liquor (44 mL). Lifestyle  Work with your health care provider to maintain a healthy body weight or to lose weight. Ask what an ideal weight is for you. Get at least 30 minutes of exercise that causes your heart to beat faster (aerobic exercise) most days of the week. Activities may include walking, swimming, or biking. Include exercise to strengthen your muscles (resistance exercise), such as Pilates or lifting weights, as part of your weekly exercise routine. Try to do these types of exercises for 30 minutes at least 3 days a week. Do not use any products that contain nicotine or tobacco. These products include cigarettes, chewing tobacco, and vaping devices, such as e-cigarettes. If you need help quitting, ask your health care provider. Monitor your blood pressure at home as told by your health care provider. Keep all follow-up visits. This is important. Medicines Take over-the-counter and prescription medicines only as told by your health care provider. Follow directions carefully. Blood  pressure medicines must be taken as prescribed. Do not skip doses of blood pressure medicine. Doing this puts you at risk for problems and can make the medicine less effective. Ask your health care provider about side effects or reactions to medicines that you should watch for. Contact a health care provider if you: Think you are having a reaction to a medicine you are taking. Have headaches that keep coming back (recurring). Feel dizzy. Have swelling in your ankles. Have trouble with your vision. Get help right away if you: Develop a severe headache or confusion. Have unusual weakness or numbness. Feel faint. Have severe pain in your chest or abdomen. Vomit repeatedly. Have trouble breathing. These symptoms may be an emergency. Get help right away. Call 911. Do not wait to see if the symptoms will go away. Do not drive yourself to the hospital. Summary Hypertension is when the force of blood pumping through your arteries is too strong. If this condition is not controlled, it may put you at risk for serious complications. Your personal target blood pressure may vary depending on your medical conditions, your age, and other factors. For most people, a normal blood pressure is less than 120/80. Hypertension is treated with lifestyle changes, medicines, or a combination of both. Lifestyle changes include losing weight, eating a healthy,   low-sodium diet, exercising more, and limiting alcohol. This information is not intended to replace advice given to you by your health care provider. Make sure you discuss any questions you have with your health care provider. Document Revised: 03/31/2021 Document Reviewed: 03/31/2021 Elsevier Patient Education  2023 [employer]  

## 2022-01-20 LAB — CMP14+EGFR
ALT: 20 IU/L (ref 0–44)
AST: 27 IU/L (ref 0–40)
Albumin/Globulin Ratio: 1.5 (ref 1.2–2.2)
Albumin: 4.2 g/dL (ref 3.8–4.9)
Alkaline Phosphatase: 59 IU/L (ref 44–121)
BUN/Creatinine Ratio: 11 (ref 9–20)
BUN: 12 mg/dL (ref 6–24)
Bilirubin Total: 0.4 mg/dL (ref 0.0–1.2)
CO2: 21 mmol/L (ref 20–29)
Calcium: 9.1 mg/dL (ref 8.7–10.2)
Chloride: 107 mmol/L — ABNORMAL HIGH (ref 96–106)
Creatinine, Ser: 1.09 mg/dL (ref 0.76–1.27)
Globulin, Total: 2.8 g/dL (ref 1.5–4.5)
Glucose: 70 mg/dL (ref 70–99)
Potassium: 5 mmol/L (ref 3.5–5.2)
Sodium: 142 mmol/L (ref 134–144)
Total Protein: 7 g/dL (ref 6.0–8.5)
eGFR: 79 mL/min/{1.73_m2} (ref >59)

## 2022-01-21 ENCOUNTER — Other Ambulatory Visit: Payer: Self-pay | Admitting: Family

## 2022-01-21 ENCOUNTER — Other Ambulatory Visit: Payer: Self-pay

## 2022-01-21 DIAGNOSIS — E1142 Type 2 diabetes mellitus with diabetic polyneuropathy: Secondary | ICD-10-CM

## 2022-01-22 ENCOUNTER — Telehealth: Payer: Self-pay | Admitting: *Deleted

## 2022-01-22 ENCOUNTER — Other Ambulatory Visit: Payer: Self-pay

## 2022-01-22 NOTE — Telephone Encounter (Signed)
Pt returned call for labs, no longer in lab queue, Gave pt results and provider comments, verbalized understanding.   Rema Fendt, NP  01/20/2022  7:54 AM EDT     Call patient with update.    - Kidney function and electrolytes normal.  - Liver function normal.

## 2022-02-15 ENCOUNTER — Other Ambulatory Visit: Payer: Self-pay | Admitting: Family

## 2022-02-15 ENCOUNTER — Other Ambulatory Visit: Payer: Self-pay

## 2022-02-15 DIAGNOSIS — E1142 Type 2 diabetes mellitus with diabetic polyneuropathy: Secondary | ICD-10-CM

## 2022-02-16 ENCOUNTER — Other Ambulatory Visit: Payer: Self-pay

## 2022-02-22 ENCOUNTER — Other Ambulatory Visit: Payer: Self-pay

## 2022-02-22 ENCOUNTER — Other Ambulatory Visit: Payer: Self-pay | Admitting: Family

## 2022-02-22 DIAGNOSIS — E1142 Type 2 diabetes mellitus with diabetic polyneuropathy: Secondary | ICD-10-CM

## 2022-02-22 NOTE — Telephone Encounter (Signed)
Requested medication (s) are due for refill today: yes  Requested medication (s) are on the active medication list: yes    Last refill: 01/19/22  #60 0 refills  Future visit scheduled yes 07/20/22  Notes to clinic:Not delegated, please review. Thank you.  Requested Prescriptions  Pending Prescriptions Disp Refills   pregabalin (LYRICA) 200 MG capsule 60 capsule 0    Sig: Take 1 capsule (200 mg total) by mouth 2 (two) times daily.     Not Delegated - Neurology:  Anticonvulsants - Controlled - pregabalin Failed - 02/22/2022  3:03 PM      Failed - This refill cannot be delegated      Passed - Cr in normal range and within 360 days    Creat  Date Value Ref Range Status  09/02/2014 0.90 0.50 - 1.35 mg/dL Final   Creatinine, Ser  Date Value Ref Range Status  01/19/2022 1.09 0.76 - 1.27 mg/dL Final         Passed - Completed PHQ-2 or PHQ-9 in the last 360 days      Passed - Valid encounter within last 12 months    Recent Outpatient Visits           1 month ago Type 2 diabetes mellitus with diabetic neuropathy, without long-term current use of insulin ([place])   Primary Care at [facility], Amy J, NP   2 months ago Neuropathic pain of both feet   Primary Care at [facility], [name], PA-C   2 months ago Acute conjunctivitis of both eyes, unspecified acute conjunctivitis type   Primary Care at [facility], MD   4 months ago Neuropathic pain of both feet   Primary Care at [facility], [name], PA-C   5 months ago Neuropathic pain of both feet   Primary Care at [facility], Loraine Grip, PA-C       Future Appointments             In 4 months Dorna Mai, MD Primary Care at [facility]

## 2022-02-22 NOTE — Telephone Encounter (Signed)
Pts Rx for pregabalin (LYRICA) 200 MG capsule needs to be resent to the pharmacy / the refill today did not go through / please advise asap

## 2022-02-22 NOTE — Telephone Encounter (Signed)
Pt called at 2:55 pm checking status of refill request   Please assist pt further

## 2022-02-23 ENCOUNTER — Ambulatory Visit: Payer: Self-pay

## 2022-02-23 ENCOUNTER — Telehealth: Payer: Self-pay | Admitting: Family Medicine

## 2022-02-23 ENCOUNTER — Other Ambulatory Visit: Payer: Self-pay

## 2022-02-23 ENCOUNTER — Telehealth: Payer: Self-pay

## 2022-02-23 NOTE — Telephone Encounter (Signed)
Chad Wolfe with [facility] states they did not receive prescription for Lyrica sent 02/22/22. Please advise.

## 2022-02-23 NOTE — Telephone Encounter (Signed)
Pt calling back to check on medication, states frustrated,  states needs asap

## 2022-02-23 NOTE — Telephone Encounter (Signed)
Please advise 

## 2022-02-23 NOTE — Telephone Encounter (Signed)
  Chief Complaint: Missing Medication Symptoms: foot pain Frequency: Since running out of medication Pertinent Negatives: Patient denies  Disposition: [] ED /[] Urgent Care (no appt availability in office) / [] Appointment(In office/virtual)/ []  [facility]/ [] Home Care/ [] Refused Recommended Disposition /[] [facility]/ [x]  Follow-up with PCP Additional Notes: PT states that refill for Lyrica is not at pharmacy. It does not appear that the Rx was electronically sent. Please review PT is very upset that his medication is not available.   PT would also like his PCP to be changed to [facility].

## 2022-02-23 NOTE — Telephone Encounter (Signed)
Reason for Disposition . [1] Prescription refill request for ESSENTIAL medicine (i.e., likelihood of harm to patient if not taken) AND [2] triager unable to refill per department policy  Answer Assessment - Initial Assessment Questions 1. DRUG NAME: "What medicine do you need to have refilled?"     Lyrica 2. REFILLS REMAINING: "How many refills are remaining?" (Note: The label on the medicine or pill bottle will show how many refills are remaining. If there are no refills remaining, then a renewal may be needed.)     none 3. EXPIRATION DATE: "What is the expiration date?" (Note: The label states when the prescription will expire, and thus can no longer be refilled.)      4. PRESCRIBING HCP: "Who prescribed it?" Reason: If prescribed by specialist, call should be referred to that group.     Dr. Redmond Pulling, Amy Minette Brine 5. SYMPTOMS: "Do you have any symptoms?"     Foot pain 6. PREGNANCY: "Is there any chance that you are pregnant?" "When was your last menstrual period?"     na  Protocols used: Medication Refill and Renewal Call-A-AH

## 2022-02-23 NOTE — Telephone Encounter (Signed)
Patient called in checking status of refill of pregablin

## 2022-02-24 ENCOUNTER — Other Ambulatory Visit: Payer: Self-pay | Admitting: Physician Assistant

## 2022-02-24 ENCOUNTER — Other Ambulatory Visit: Payer: Self-pay

## 2022-02-24 DIAGNOSIS — E1142 Type 2 diabetes mellitus with diabetic polyneuropathy: Secondary | ICD-10-CM

## 2022-02-24 MED ORDER — PREGABALIN 200 MG PO CAPS
200.0000 mg | ORAL_CAPSULE | Freq: Two times a day (BID) | ORAL | 0 refills | Status: DC
  Filled 2022-02-24: qty 60, 30d supply, fill #0

## 2022-02-24 NOTE — Telephone Encounter (Signed)
RX for  pregabalin (LYRICA) 200 MG capsule is showing it was sent to the pharmacy on 9.18.23 but the pharmacy has not received it and pt has called several times about this / please advise asap when it has been resent / pt needs the medication asap so he can go to work and he goes out of town for work tomorrow morning

## 2022-02-24 NOTE — Telephone Encounter (Signed)
[facility] willing to order this for patient? Per April, pharmacy tech the order never made it to them. Patient has been without medication for a few days and he's not able to work due to pain.

## 2022-02-24 NOTE — Telephone Encounter (Signed)
Pt went to pharmacy to pick up order that was to be ready for him on 9.18.23 but the pharmacy advised the pt that they do not have a script for the Pregablin and asked him to contact provider office to have them resend it / please contact pharmacy or advise

## 2022-02-24 NOTE — Telephone Encounter (Signed)
If we can get an official reply from the pharmacy in regards to if the patient actually picked up the medication (Pregabalin) or if the medication is available for pickup according to the script I ordered on 01/19/2022 with pickup date indicated as 02/22/2022. Report from the patient will not suffice. Please keep me updated.

## 2022-02-24 NOTE — Telephone Encounter (Signed)
I have attempted without success to contact this patient by phone to return their call.

## 2022-02-24 NOTE — Telephone Encounter (Signed)
Pregabalin was ordered 01/19/2022 with a release date to pharmacy for patient pickup on 02/22/2022. Considering Pregabalin is a controlled substance I need the name and credentials (CMA, RN etc.) of the person who called the pharmacy to confirm that Pregabalin is not available at the pharmacy and that the medication has not been picked up by the patient prior to my refilling.

## 2022-02-24 NOTE — Telephone Encounter (Signed)
Pt is calling back upset stated he doesn't understand why this is taking so long and why it hasn't been taken care of . Pt was very upset requesting a call back today.  Please advise.

## 2022-02-25 ENCOUNTER — Other Ambulatory Visit: Payer: Self-pay

## 2022-02-25 NOTE — Telephone Encounter (Signed)
Thank you :)

## 2022-02-25 NOTE — Telephone Encounter (Signed)
Pharmacy was called and confirmed it was not pickup. Medication was reordered by [facility] on 02/24/2022

## 2022-03-11 ENCOUNTER — Other Ambulatory Visit: Payer: Self-pay

## 2022-03-12 ENCOUNTER — Other Ambulatory Visit: Payer: Self-pay

## 2022-03-22 ENCOUNTER — Other Ambulatory Visit: Payer: Self-pay | Admitting: Physician Assistant

## 2022-03-22 ENCOUNTER — Other Ambulatory Visit: Payer: Self-pay

## 2022-03-22 DIAGNOSIS — E1142 Type 2 diabetes mellitus with diabetic polyneuropathy: Secondary | ICD-10-CM

## 2022-03-24 NOTE — Progress Notes (Signed)
Patient ID: Chad Wolfe, male    DOB: 08/18/1965  MRN: [id number]  CC: Chronic Care Management   Subjective: Chad Wolfe is a 56 y.o. male who presents for chronic care management.   His concerns today include:  Reports taking Lyrica 3 times daily instead of prescribed 2 times daily for neuropathy. States he works in [employer] and Lyrica capsule provides relief for about 3 to 4 hours and then feet begin hurting again.  Patient Active Problem List   Diagnosis Date Noted   Uninsured 02/04/2021   Transaminitis 02/04/2021   BMI 22.0-22.9, adult 01/15/2021   DKA (diabetic ketoacidosis) (HCC) 01/05/2021   DKA, type 2 (HCC) 01/04/2021   Open finger dislocation 02/26/2017   Diabetes mellitus type 2 in nonobese (HCC) 01/21/2016   Fall from chair 05/15/2015   Alcohol abuse 05/05/2015   Pain, dental 03/27/2013   HTN (hypertension) 04/25/2012   Chronic hepatitis C virus infection (HCC) 11/15/2007   ABDOMINAL PAIN-EPIGASTRIC 11/15/2007     Current Outpatient Medications on File Prior to Visit  Medication Sig Dispense Refill   cholecalciferol (VITAMIN D3) 25 MCG (1000 UNIT) tablet Take 1,000 Units by mouth daily. (Patient not taking: Reported on 12/22/2021)     gabapentin (NEURONTIN) 600 MG tablet Take 1 tablet (600 mg total) by mouth 3 (three) times daily. 90 tablet 5   glipiZIDE (GLUCOTROL) 5 MG tablet Take 1 tablet (5 mg total) by mouth 2 (two) times daily. 60 tablet 5   glucose blood (TRUE METRIX BLOOD GLUCOSE TEST) test strip Use as instructed 100 each 12   lisinopril (ZESTRIL) 5 MG tablet Take 1 tablet (5 mg total) by mouth once daily. 30 tablet 5   Omega-3 Fatty Acids (FISH OIL) 1000 MG CAPS Take by mouth.     tobramycin-dexamethasone (TOBRADEX) ophthalmic solution Place 2 drops into both eyes every 4 (four) hours while awake. (Patient not taking: Reported on 12/22/2021) 5 mL 0   TRUEplus Lancets 28G MISC use as directed 100 each 12   No current facility-administered  medications on file prior to visit.    Allergies  Allergen Reactions   Ibuprofen Other (See Comments)    Stomach upset   Aspirin Other (See Comments)   Other Other (See Comments)    MD told him not to take [facility] due to Hep C   Tylenol [Acetaminophen] Other (See Comments)    HEP C    Social History   Socioeconomic History   Marital status: Divorced    Spouse name: Not on file   Number of children: Not on file   Years of education: Not on file   Highest education level: Not on file  Occupational History   Not on file  Tobacco Use   Smoking status: Some Days    Types: Cigarettes    Passive exposure: Current   Smokeless tobacco: Never  Substance and Sexual Activity   Alcohol use: Yes    Alcohol/week: 0.0 standard drinks of alcohol    Comment: drank a quart of beer this morning PT DENIES 09/26/15   Drug use: No    Types: Cocaine, Oxycodone    Comment: oxycodone 2 days ago "my sister gave me one"; no cocaine use   Sexual activity: Not Currently    Comment: opiates  Other Topics Concern   Not on file  Social History Narrative   Not on file   Social Determinants of Health   Financial Resource Strain: Not on file  Food Insecurity: Not on  file  Transportation Needs: Not on file  Physical Activity: Not on file  Stress: Not on file  Social Connections: Not on file  Intimate Partner Violence: Not on file    Family History  Problem Relation Age of Onset   Diabetes Father    Hypertension Father    Diabetes Sister    Hypertension Sister    Diabetes Brother    Hypertension Brother     Past Surgical History:  Procedure Laterality Date   I & D EXTREMITY Right 02/26/2017   Procedure: IRRIGATION AND DEBRIDEMENT AND REDUCTION OF RIGHT MIDDLE FINGER;  Surgeon: Dominica Severin, MD;  Location: [facility] OR;  Service: Orthopedics;  Laterality: Right;   STOMACH SURGERY      ROS: Review of Systems Negative except as stated above  PHYSICAL EXAM: BP 124/82   Pulse 94   Temp 98.3  F (36.8 C)   Ht 5' 8.98" (1.752 m)   Wt 186 lb (84.4 kg)   SpO2 94%   BMI 27.49 kg/m   Physical Exam HENT:     Head: Normocephalic and atraumatic.  Eyes:     Extraocular Movements: Extraocular movements intact.     Conjunctiva/sclera: Conjunctivae normal.     Pupils: Pupils are equal, round, and reactive to light.  Cardiovascular:     Rate and Rhythm: Normal rate and regular rhythm.     Pulses: Normal pulses.     Heart sounds: Normal heart sounds.  Pulmonary:     Effort: Pulmonary effort is normal.     Breath sounds: Normal breath sounds.  Musculoskeletal:     Cervical back: Normal range of motion and neck supple.  Neurological:     General: No focal deficit present.     Mental Status: He is alert and oriented to person, place, and time.  Psychiatric:        Mood and Affect: Mood normal.        Behavior: Behavior normal.    ASSESSMENT AND PLAN: 1. Diabetic polyneuropathy associated with type 2 diabetes mellitus (HCC) - Discussed with patient's primary provider [name], MD in detail. Pregabalin increased from 200 mg twice daily to 200 mg three times daily. Discussed the new dose increase is the maximum per recommendations of his PCP and patient verbalized understanding. Discussed referral to Pain Management as recommended. Patient states he cannot establish with Pain Management due to having no health insurance. States he is in the process of trying to get Obamacare and will update his primary provider once approved for referral to Pain Management.  - I did check the [facility] prescription drug database. - Follow-up with primary provider as scheduled.  - pregabalin (LYRICA) 200 MG capsule; Take 1 capsule (200 mg total) by mouth in the morning, at noon, and at bedtime.  Dispense: 90 capsule; Refill: 0  2. Screening cholesterol level - Routine screening.  - Lipid Panel   Patient was given the opportunity to ask questions.  Patient verbalized understanding of the  plan and was able to repeat key elements of the plan. Patient was given clear instructions to go to Emergency Department or return to medical center if symptoms don't improve, worsen, or new problems develop.The patient verbalized understanding.   Orders Placed This Encounter  Procedures   Lipid Panel     Requested Prescriptions   Signed Prescriptions Disp Refills   pregabalin (LYRICA) 200 MG capsule 90 capsule 0    Sig: Take 1 capsule (200 mg total) by mouth in the morning,  at noon, and at bedtime.    Return in about 4 weeks (around 04/27/2022) for Follow-Up or next available chronic care mgmt with Dorna Mai, MD .  Camillia Herter, NP

## 2022-03-25 ENCOUNTER — Other Ambulatory Visit: Payer: Self-pay

## 2022-03-26 ENCOUNTER — Other Ambulatory Visit: Payer: Self-pay

## 2022-03-29 ENCOUNTER — Other Ambulatory Visit: Payer: Self-pay

## 2022-03-30 ENCOUNTER — Ambulatory Visit (INDEPENDENT_AMBULATORY_CARE_PROVIDER_SITE_OTHER): Payer: Self-pay | Admitting: Family

## 2022-03-30 ENCOUNTER — Encounter: Payer: Self-pay | Admitting: Family

## 2022-03-30 ENCOUNTER — Other Ambulatory Visit: Payer: Self-pay

## 2022-03-30 VITALS — BP 124/82 | HR 94 | Temp 98.3°F | Ht 68.98 in | Wt 186.0 lb

## 2022-03-30 DIAGNOSIS — E1140 Type 2 diabetes mellitus with diabetic neuropathy, unspecified: Secondary | ICD-10-CM

## 2022-03-30 DIAGNOSIS — E1142 Type 2 diabetes mellitus with diabetic polyneuropathy: Secondary | ICD-10-CM

## 2022-03-30 DIAGNOSIS — I10 Essential (primary) hypertension: Secondary | ICD-10-CM

## 2022-03-30 DIAGNOSIS — Z13220 Encounter for screening for lipoid disorders: Secondary | ICD-10-CM

## 2022-03-30 MED ORDER — PREGABALIN 200 MG PO CAPS
200.0000 mg | ORAL_CAPSULE | Freq: Three times a day (TID) | ORAL | 0 refills | Status: DC
  Filled 2022-03-30: qty 90, 30d supply, fill #0

## 2022-03-30 NOTE — Progress Notes (Signed)
Pt presents for bilateral foot numbness  -pt has been taking 3 Lyrica when only prescribed 2, states does not understand why provider only gives him 30

## 2022-03-31 ENCOUNTER — Other Ambulatory Visit: Payer: Self-pay

## 2022-04-01 ENCOUNTER — Other Ambulatory Visit: Payer: Self-pay

## 2022-04-12 ENCOUNTER — Other Ambulatory Visit: Payer: Self-pay

## 2022-04-27 ENCOUNTER — Other Ambulatory Visit: Payer: Self-pay

## 2022-04-28 ENCOUNTER — Other Ambulatory Visit: Payer: Self-pay

## 2022-05-04 ENCOUNTER — Encounter: Payer: Self-pay | Admitting: Family Medicine

## 2022-05-04 ENCOUNTER — Other Ambulatory Visit: Payer: Self-pay

## 2022-05-04 ENCOUNTER — Ambulatory Visit (INDEPENDENT_AMBULATORY_CARE_PROVIDER_SITE_OTHER): Payer: No Typology Code available for payment source | Admitting: Family Medicine

## 2022-05-04 VITALS — BP 124/81 | HR 101 | Temp 98.4°F | Resp 16 | Wt 188.0 lb

## 2022-05-04 DIAGNOSIS — I10 Essential (primary) hypertension: Secondary | ICD-10-CM

## 2022-05-04 DIAGNOSIS — E1142 Type 2 diabetes mellitus with diabetic polyneuropathy: Secondary | ICD-10-CM

## 2022-05-04 DIAGNOSIS — R059 Cough, unspecified: Secondary | ICD-10-CM

## 2022-05-04 LAB — POCT GLYCOSYLATED HEMOGLOBIN (HGB A1C): Hemoglobin A1C: 6.1 % — AB (ref 4.0–5.6)

## 2022-05-04 MED ORDER — PREGABALIN 200 MG PO CAPS
200.0000 mg | ORAL_CAPSULE | Freq: Three times a day (TID) | ORAL | 2 refills | Status: DC
  Filled 2022-05-04: qty 90, 30d supply, fill #0
  Filled 2022-06-05: qty 90, 30d supply, fill #1
  Filled 2022-07-07: qty 90, 30d supply, fill #2

## 2022-05-04 MED ORDER — LISINOPRIL 5 MG PO TABS
5.0000 mg | ORAL_TABLET | Freq: Every day | ORAL | 5 refills | Status: DC
  Filled 2022-05-04: qty 30, 30d supply, fill #0
  Filled 2022-06-05: qty 30, 30d supply, fill #1
  Filled 2022-07-08: qty 30, 30d supply, fill #2

## 2022-05-04 MED ORDER — PREDNISONE 50 MG PO TABS
50.0000 mg | ORAL_TABLET | Freq: Every day | ORAL | 0 refills | Status: DC
  Filled 2022-05-04: qty 5, 5d supply, fill #0

## 2022-05-04 MED ORDER — GABAPENTIN 600 MG PO TABS
600.0000 mg | ORAL_TABLET | Freq: Three times a day (TID) | ORAL | 5 refills | Status: DC
  Filled 2022-05-04: qty 90, 30d supply, fill #0
  Filled 2022-07-08: qty 90, 30d supply, fill #1
  Filled 2022-08-31: qty 90, 30d supply, fill #2
  Filled 2022-10-21: qty 90, 30d supply, fill #3
  Filled 2022-12-06: qty 90, 30d supply, fill #4
  Filled 2023-01-04 – 2023-01-05 (×2): qty 90, 30d supply, fill #5

## 2022-05-04 MED ORDER — ALBUTEROL SULFATE HFA 108 (90 BASE) MCG/ACT IN AERS
2.0000 | INHALATION_SPRAY | Freq: Four times a day (QID) | RESPIRATORY_TRACT | 0 refills | Status: DC | PRN
  Filled 2022-05-04: qty 6.7, 25d supply, fill #0

## 2022-05-05 ENCOUNTER — Encounter: Payer: Self-pay | Admitting: Family Medicine

## 2022-05-05 ENCOUNTER — Other Ambulatory Visit: Payer: Self-pay

## 2022-05-05 NOTE — Progress Notes (Signed)
Established Patient Office Visit  Subjective    Patient ID: Chad Wolfe, male    DOB: Apr 23, 1966  Age: 56 y.o. MRN: [id number]  CC:  Chief Complaint  Patient presents with   Follow-up   Diabetes   Cough    HPI Chad Wolfe presents for routine follow up of chronic med issues. Patient also reports that he has been having a cough for several days. He denies fever/chills or viral sx. He denies known contacts or exposures.    Outpatient Encounter Medications as of 05/04/2022  Medication Sig   albuterol (VENTOLIN HFA) 108 (90 Base) MCG/ACT inhaler Inhale 2 puffs into the lungs every 6 (six) hours as needed for wheezing or shortness of breath.   predniSONE (DELTASONE) 50 MG tablet Take 1 tablet (50 mg total) by mouth daily with breakfast.   gabapentin (NEURONTIN) 600 MG tablet Take 1 tablet (600 mg total) by mouth 3 (three) times daily.   glipiZIDE (GLUCOTROL) 5 MG tablet Take 1 tablet (5 mg total) by mouth 2 (two) times daily.   glucose blood (TRUE METRIX BLOOD GLUCOSE TEST) test strip Use as instructed   lisinopril (ZESTRIL) 5 MG tablet Take 1 tablet (5 mg total) by mouth once daily.   Omega-3 Fatty Acids (FISH OIL) 1000 MG CAPS Take by mouth.   pregabalin (LYRICA) 200 MG capsule Take 1 capsule (200 mg total) by mouth in the morning, at noon, and at bedtime.   TRUEplus Lancets 28G MISC use as directed   [DISCONTINUED] cholecalciferol (VITAMIN D3) 25 MCG (1000 UNIT) tablet Take 1,000 Units by mouth daily. (Patient not taking: Reported on 12/22/2021)   [DISCONTINUED] gabapentin (NEURONTIN) 600 MG tablet Take 1 tablet (600 mg total) by mouth 3 (three) times daily.   [DISCONTINUED] lisinopril (ZESTRIL) 5 MG tablet Take 1 tablet (5 mg total) by mouth once daily.   [DISCONTINUED] pregabalin (LYRICA) 200 MG capsule Take 1 capsule (200 mg total) by mouth in the morning, at noon, and at bedtime.   [DISCONTINUED] tobramycin-dexamethasone (TOBRADEX) ophthalmic solution Place 2 drops into  both eyes every 4 (four) hours while awake. (Patient not taking: Reported on 12/22/2021)   No facility-administered encounter medications on file as of 05/04/2022.    Past Medical History:  Diagnosis Date   Diabetes mellitus type 2 in nonobese (HCC) 01/21/2016   Gastric ulcer    Hepatitis C    Hyperlipidemia    Hypertension    Substance abuse (HCC)     Past Surgical History:  Procedure Laterality Date   I & D EXTREMITY Right 02/26/2017   Procedure: IRRIGATION AND DEBRIDEMENT AND REDUCTION OF RIGHT MIDDLE FINGER;  Surgeon: Dominica Severin, MD;  Location: [facility] OR;  Service: Orthopedics;  Laterality: Right;   STOMACH SURGERY      Family History  Problem Relation Age of Onset   Diabetes Father    Hypertension Father    Diabetes Sister    Hypertension Sister    Diabetes Brother    Hypertension Brother     Social History   Socioeconomic History   Marital status: Divorced    Spouse name: Not on file   Number of children: Not on file   Years of education: Not on file   Highest education level: Not on file  Occupational History   Not on file  Tobacco Use   Smoking status: Some Days    Types: Cigarettes    Passive exposure: Current   Smokeless tobacco: Never  Substance and Sexual Activity  Alcohol use: Yes    Alcohol/week: 0.0 standard drinks of alcohol    Comment: drank a quart of beer this morning PT DENIES 09/26/15   Drug use: No    Types: Cocaine, Oxycodone    Comment: oxycodone 2 days ago "my sister gave me one"; no cocaine use   Sexual activity: Not Currently    Comment: opiates  Other Topics Concern   Not on file  Social History Narrative   Not on file   Social Determinants of Health   Financial Resource Strain: Not on file  Food Insecurity: Not on file  Transportation Needs: Not on file  Physical Activity: Not on file  Stress: Not on file  Social Connections: Not on file  Intimate Partner Violence: Not on file    Review of Systems  Constitutional:   Negative for chills, fever and malaise/fatigue.  Respiratory:  Positive for cough. Negative for shortness of breath.   All other systems reviewed and are negative.       Objective    BP 124/81   Pulse (!) 101   Temp 98.4 F (36.9 C) (Oral)   Resp 16   Wt 188 lb (85.3 kg)   SpO2 93%   BMI 27.78 kg/m   Physical Exam Vitals and nursing note reviewed.  Constitutional:      General: He is not in acute distress. Cardiovascular:     Rate and Rhythm: Normal rate and regular rhythm.  Pulmonary:     Effort: Pulmonary effort is normal. No respiratory distress.     Breath sounds: No wheezing.  Abdominal:     Palpations: Abdomen is soft.     Tenderness: There is no abdominal tenderness.  Neurological:     General: No focal deficit present.     Mental Status: He is alert and oriented to person, place, and time.         Assessment & Plan:   1. Diabetic polyneuropathy associated with type 2 diabetes mellitus (HCC) A1c improved and at goal. Meds refilled. continue - Microalbumin / creatinine urine ratio - POCT glycosylated hemoglobin (Hb A1C) - gabapentin (NEURONTIN) 600 MG tablet; Take 1 tablet (600 mg total) by mouth 3 (three) times daily.  Dispense: 90 tablet; Refill: 5 - pregabalin (LYRICA) 200 MG capsule; Take 1 capsule (200 mg total) by mouth in the morning, at noon, and at bedtime.  Dispense: 90 capsule; Refill: 2  2. Primary hypertension  Appears stable. Continue. Meds refilled.  - lisinopril (ZESTRIL) 5 MG tablet; Take 1 tablet (5 mg total) by mouth once daily.  Dispense: 30 tablet; Refill: 5  3. Cough, unspecified type Prednisone and albuterol prescribed    Return in about 3 months (around 08/04/2022) for follow up.   [name], MD

## 2022-05-06 ENCOUNTER — Other Ambulatory Visit (HOSPITAL_COMMUNITY): Payer: Self-pay

## 2022-05-06 ENCOUNTER — Other Ambulatory Visit: Payer: Self-pay

## 2022-05-06 LAB — MICROALBUMIN / CREATININE URINE RATIO
Creatinine, Urine: 160.9 mg/dL
Microalb/Creat Ratio: 11 mg/g creat (ref 0–29)
Microalbumin, Urine: 17.7 ug/mL

## 2022-05-27 ENCOUNTER — Other Ambulatory Visit: Payer: Self-pay

## 2022-05-28 ENCOUNTER — Other Ambulatory Visit: Payer: Self-pay

## 2022-06-08 ENCOUNTER — Other Ambulatory Visit: Payer: Self-pay

## 2022-06-09 ENCOUNTER — Other Ambulatory Visit: Payer: Self-pay

## 2022-06-28 ENCOUNTER — Other Ambulatory Visit: Payer: Self-pay

## 2022-07-01 ENCOUNTER — Telehealth: Payer: Self-pay | Admitting: Family Medicine

## 2022-07-05 ENCOUNTER — Other Ambulatory Visit: Payer: Self-pay

## 2022-07-06 ENCOUNTER — Other Ambulatory Visit: Payer: Self-pay

## 2022-07-07 ENCOUNTER — Other Ambulatory Visit: Payer: Self-pay

## 2022-07-09 ENCOUNTER — Other Ambulatory Visit: Payer: Self-pay

## 2022-07-20 ENCOUNTER — Ambulatory Visit: Payer: No Typology Code available for payment source | Admitting: Family Medicine

## 2022-07-26 ENCOUNTER — Other Ambulatory Visit: Payer: Self-pay

## 2022-08-02 ENCOUNTER — Encounter: Payer: Self-pay | Admitting: Family Medicine

## 2022-08-02 ENCOUNTER — Other Ambulatory Visit: Payer: Self-pay

## 2022-08-02 ENCOUNTER — Ambulatory Visit (INDEPENDENT_AMBULATORY_CARE_PROVIDER_SITE_OTHER): Payer: Self-pay | Admitting: Family Medicine

## 2022-08-02 VITALS — BP 177/102 | HR 94 | Temp 98.1°F | Resp 16 | Wt 204.8 lb

## 2022-08-02 DIAGNOSIS — I10 Essential (primary) hypertension: Secondary | ICD-10-CM

## 2022-08-02 DIAGNOSIS — F17210 Nicotine dependence, cigarettes, uncomplicated: Secondary | ICD-10-CM

## 2022-08-02 DIAGNOSIS — E1142 Type 2 diabetes mellitus with diabetic polyneuropathy: Secondary | ICD-10-CM

## 2022-08-02 DIAGNOSIS — E1140 Type 2 diabetes mellitus with diabetic neuropathy, unspecified: Secondary | ICD-10-CM

## 2022-08-02 LAB — POCT GLYCOSYLATED HEMOGLOBIN (HGB A1C): Hemoglobin A1C: 7.6 % — AB (ref 4.0–5.6)

## 2022-08-02 MED ORDER — PREGABALIN 200 MG PO CAPS
200.0000 mg | ORAL_CAPSULE | Freq: Three times a day (TID) | ORAL | 2 refills | Status: DC
  Filled 2022-08-02: qty 90, 30d supply, fill #0
  Filled 2022-08-31: qty 90, 30d supply, fill #1
  Filled 2022-09-30: qty 60, 20d supply, fill #2
  Filled 2022-09-30: qty 30, 10d supply, fill #2

## 2022-08-02 MED ORDER — GLIPIZIDE 10 MG PO TABS
10.0000 mg | ORAL_TABLET | Freq: Two times a day (BID) | ORAL | 3 refills | Status: DC
  Filled 2022-08-02: qty 60, 30d supply, fill #0
  Filled 2022-08-31: qty 180, 90d supply, fill #1

## 2022-08-02 MED ORDER — LISINOPRIL 20 MG PO TABS
20.0000 mg | ORAL_TABLET | Freq: Every day | ORAL | 0 refills | Status: DC
  Filled 2022-08-02: qty 30, 30d supply, fill #0

## 2022-08-02 MED ORDER — HYDROCHLOROTHIAZIDE 25 MG PO TABS
25.0000 mg | ORAL_TABLET | Freq: Every day | ORAL | 0 refills | Status: DC
  Filled 2022-08-02: qty 30, 30d supply, fill #0
  Filled 2023-07-18: qty 30, 30d supply, fill #1

## 2022-08-02 NOTE — Progress Notes (Unsigned)
Patient is here for their  3 month follow-up Patient has no concerns today Care gaps have been discussed with patient

## 2022-08-03 ENCOUNTER — Encounter: Payer: Self-pay | Admitting: Family Medicine

## 2022-08-03 ENCOUNTER — Other Ambulatory Visit: Payer: Self-pay

## 2022-08-03 NOTE — Progress Notes (Signed)
Established Patient Office Visit  Subjective    Patient ID: Chad Wolfe, male    DOB: Nov 15, 1965  Age: 57 y.o. MRN: [id number]  CC: No chief complaint on file.   HPI Chad Wolfe presents for routine follow up of chronic med issues. Patient denies acute complaints or concerns.    Outpatient Encounter Medications as of 08/02/2022  Medication Sig   albuterol (VENTOLIN HFA) 108 (90 Base) MCG/ACT inhaler Inhale 2 puffs into the lungs every 6 (six) hours as needed for wheezing or shortness of breath.   gabapentin (NEURONTIN) 600 MG tablet Take 1 tablet (600 mg total) by mouth 3 (three) times daily.   glipiZIDE (GLUCOTROL) 10 MG tablet Take 1 tablet (10 mg total) by mouth 2 (two) times daily before a meal.   glucose blood (TRUE METRIX BLOOD GLUCOSE TEST) test strip Use as instructed   hydrochlorothiazide (HYDRODIURIL) 25 MG tablet Take 1 tablet (25 mg total) by mouth daily.   lisinopril (ZESTRIL) 20 MG tablet Take 1 tablet (20 mg total) by mouth daily.   Omega-3 Fatty Acids (FISH OIL) 1000 MG CAPS Take by mouth.   predniSONE (DELTASONE) 50 MG tablet Take 1 tablet (50 mg total) by mouth daily with breakfast.   TRUEplus Lancets 28G MISC use as directed   [DISCONTINUED] glipiZIDE (GLUCOTROL) 5 MG tablet Take 1 tablet (5 mg total) by mouth 2 (two) times daily.   [DISCONTINUED] lisinopril (ZESTRIL) 5 MG tablet Take 1 tablet (5 mg total) by mouth once daily.   [DISCONTINUED] pregabalin (LYRICA) 200 MG capsule Take 1 capsule (200 mg total) by mouth in the morning, at noon, and at bedtime.   pregabalin (LYRICA) 200 MG capsule Take 1 capsule (200 mg total) by mouth in the morning, at noon, and at bedtime.   No facility-administered encounter medications on file as of 08/02/2022.    Past Medical History:  Diagnosis Date   Diabetes mellitus type 2 in nonobese ([place]) 01/21/2016   Gastric ulcer    Hepatitis C    Hyperlipidemia    Hypertension    Substance abuse ([place])     Past Surgical  History:  Procedure Laterality Date   I & D EXTREMITY Right 02/26/2017   Procedure: IRRIGATION AND DEBRIDEMENT AND REDUCTION OF RIGHT MIDDLE FINGER;  Surgeon: Roseanne Kaufman, MD;  Location: [place] [facility];  Service: Orthopedics;  Laterality: Right;   STOMACH SURGERY      Family History  Problem Relation Age of Onset   Diabetes Father    Hypertension Father    Diabetes Sister    Hypertension Sister    Diabetes Brother    Hypertension Brother     Social History   Socioeconomic History   Marital status: Divorced    Spouse name: Not on file   Number of children: Not on file   Years of education: Not on file   Highest education level: Not on file  Occupational History   Not on file  Tobacco Use   Smoking status: Some Days    Types: Cigarettes    Passive exposure: Current   Smokeless tobacco: Never  Substance and Sexual Activity   Alcohol use: Yes    Alcohol/week: 0.0 standard drinks of alcohol    Comment: drank a quart of beer this morning PT DENIES 09/26/15   Drug use: No    Types: Cocaine, Oxycodone    Comment: oxycodone 2 days ago "my sister gave me one"; no cocaine use   Sexual activity: Not Currently  Comment: opiates  Other Topics Concern   Not on file  Social History Narrative   Not on file   Social Determinants of Health   Financial Resource Strain: Not on file  Food Insecurity: Not on file  Transportation Needs: Not on file  Physical Activity: Not on file  Stress: Not on file  Social Connections: Not on file  Intimate Partner Violence: Not on file    Review of Systems  All other systems reviewed and are negative.       Objective    BP (!) 177/102   Pulse 94   Temp 98.1 F (36.7 C) (Oral)   Resp 16   Wt 204 lb 12.8 oz (92.9 kg)   SpO2 93%   BMI 30.26 kg/m   Physical Exam Vitals and nursing note reviewed.  Constitutional:      General: He is not in acute distress. Cardiovascular:     Rate and Rhythm: Normal rate and regular rhythm.   Pulmonary:     Effort: Pulmonary effort is normal. No respiratory distress.     Breath sounds: No wheezing.  Abdominal:     Palpations: Abdomen is soft.     Tenderness: There is no abdominal tenderness.  Neurological:     General: No focal deficit present.     Mental Status: He is alert and oriented to person, place, and time.         Assessment & Plan:   1. Type 2 diabetes mellitus with diabetic neuropathy, without long-term current use of insulin (HCC) Elevated A1c and no longer at goal. Will increase glipizide from '5mg'$  to 10 mg BID. Discussed compliance. Schedule with Lurena Joiner for med mgt - POCT glycosylated hemoglobin (Hb A1C)  2. Diabetic polyneuropathy associated with type 2 diabetes mellitus (HCC) Lyrica refilled.  - pregabalin (LYRICA) 200 MG capsule; Take 1 capsule (200 mg total) by mouth in the morning, at noon, and at bedtime.  Dispense: 90 capsule; Refill: 2  3. Essential hypertension Elevated reading. Lisinopril increased from 5  mg to 20 mg daily and HCTZ '25mg'$  daily added to regimen    Return for follow up.   Becky Sax, MD

## 2022-08-13 ENCOUNTER — Ambulatory Visit (INDEPENDENT_AMBULATORY_CARE_PROVIDER_SITE_OTHER): Payer: Self-pay | Admitting: Family Medicine

## 2022-08-13 ENCOUNTER — Encounter: Payer: Self-pay | Admitting: Family Medicine

## 2022-08-13 ENCOUNTER — Other Ambulatory Visit: Payer: Self-pay

## 2022-08-13 VITALS — BP 122/83 | HR 86 | Temp 98.1°F | Resp 16 | Wt 195.2 lb

## 2022-08-13 DIAGNOSIS — E1140 Type 2 diabetes mellitus with diabetic neuropathy, unspecified: Secondary | ICD-10-CM

## 2022-08-13 DIAGNOSIS — I10 Essential (primary) hypertension: Secondary | ICD-10-CM

## 2022-08-13 MED ORDER — LISINOPRIL-HYDROCHLOROTHIAZIDE 20-25 MG PO TABS
1.0000 | ORAL_TABLET | Freq: Every day | ORAL | 1 refills | Status: DC
  Filled 2022-08-13 – 2022-08-31 (×2): qty 90, 90d supply, fill #0
  Filled 2023-05-27 (×2): qty 90, 90d supply, fill #1

## 2022-08-16 ENCOUNTER — Encounter: Payer: Self-pay | Admitting: Family Medicine

## 2022-08-16 NOTE — Progress Notes (Signed)
Established Patient Office Visit  Subjective    Patient ID: Chad Wolfe, male    DOB: 1965/07/21  Age: 57 y.o. MRN: [id number]  CC:  Chief Complaint  Patient presents with   Follow-up   Hypertension    HPI Chad Wolfe presents for routine follow up of chronic med issues. Patient denies acute complaints or concerns.    Outpatient Encounter Medications as of 08/13/2022  Medication Sig   lisinopril-hydrochlorothiazide (ZESTORETIC) 20-25 MG tablet Take 1 tablet by mouth daily.   albuterol (VENTOLIN HFA) 108 (90 Base) MCG/ACT inhaler Inhale 2 puffs into the lungs every 6 (six) hours as needed for wheezing or shortness of breath.   gabapentin (NEURONTIN) 600 MG tablet Take 1 tablet (600 mg total) by mouth 3 (three) times daily.   glipiZIDE (GLUCOTROL) 10 MG tablet Take 1 tablet (10 mg total) by mouth 2 (two) times daily before a meal.   glucose blood (TRUE METRIX BLOOD GLUCOSE TEST) test strip Use as instructed   hydrochlorothiazide (HYDRODIURIL) 25 MG tablet Take 1 tablet (25 mg total) by mouth daily.   lisinopril (ZESTRIL) 20 MG tablet Take 1 tablet (20 mg total) by mouth daily.   Omega-3 Fatty Acids (FISH OIL) 1000 MG CAPS Take by mouth.   predniSONE (DELTASONE) 50 MG tablet Take 1 tablet (50 mg total) by mouth daily with breakfast.   pregabalin (LYRICA) 200 MG capsule Take 1 capsule (200 mg total) by mouth in the morning, at noon, and at bedtime.   TRUEplus Lancets 28G MISC use as directed   No facility-administered encounter medications on file as of 08/13/2022.    Past Medical History:  Diagnosis Date   Diabetes mellitus type 2 in nonobese ([place]) 01/21/2016   Gastric ulcer    Hepatitis C    Hyperlipidemia    Hypertension    Substance abuse ([place])     Past Surgical History:  Procedure Laterality Date   I & D EXTREMITY Right 02/26/2017   Procedure: IRRIGATION AND DEBRIDEMENT AND REDUCTION OF RIGHT MIDDLE FINGER;  Surgeon: Roseanne Kaufman, MD;  Location: [facility];   Service: Orthopedics;  Laterality: Right;   STOMACH SURGERY      Family History  Problem Relation Age of Onset   Diabetes Father    Hypertension Father    Diabetes Sister    Hypertension Sister    Diabetes Brother    Hypertension Brother     Social History   Socioeconomic History   Marital status: Divorced    Spouse name: Not on file   Number of children: Not on file   Years of education: Not on file   Highest education level: Not on file  Occupational History   Not on file  Tobacco Use   Smoking status: Some Days    Types: Cigarettes    Passive exposure: Current   Smokeless tobacco: Never  Substance and Sexual Activity   Alcohol use: Yes    Alcohol/week: 0.0 standard drinks of alcohol    Comment: drank a quart of beer this morning PT DENIES 09/26/15   Drug use: No    Types: Cocaine, Oxycodone    Comment: oxycodone 2 days ago "my sister gave me one"; no cocaine use   Sexual activity: Not Currently    Comment: opiates  Other Topics Concern   Not on file  Social History Narrative   Not on file   Social Determinants of Health   Financial Resource Strain: Not on file  Food Insecurity: Not  on file  Transportation Needs: Not on file  Physical Activity: Not on file  Stress: Not on file  Social Connections: Not on file  Intimate Partner Violence: Not on file    Review of Systems  All other systems reviewed and are negative.       Objective    BP 122/83   Pulse 86   Temp 98.1 F (36.7 C) (Oral)   Resp 16   Wt 195 lb 3.2 oz (88.5 kg)   SpO2 93%   BMI 28.85 kg/m   Physical Exam Vitals and nursing note reviewed.  Constitutional:      General: He is not in acute distress. Cardiovascular:     Rate and Rhythm: Normal rate and regular rhythm.  Pulmonary:     Effort: Pulmonary effort is normal. No respiratory distress.     Breath sounds: No wheezing.  Abdominal:     Palpations: Abdomen is soft.     Tenderness: There is no abdominal tenderness.   Neurological:     General: No focal deficit present.     Mental Status: He is alert and oriented to person, place, and time.         Assessment & Plan:   1. Type 2 diabetes mellitus with diabetic neuropathy, without long-term current use of insulin (HCC) Increasing A1c and no longer at goal. Discussed steroids use and compliance.   2. Primary hypertension Appear stable. Continue     Return in about 3 months (around 11/13/2022).   Becky Sax, MD

## 2022-08-19 ENCOUNTER — Other Ambulatory Visit: Payer: Self-pay

## 2022-08-20 ENCOUNTER — Telehealth: Payer: Self-pay

## 2022-08-20 NOTE — Telephone Encounter (Signed)
Patient attempted to be outreached by Si Raider, PharmD Candidate on 3/15 to discuss hypertension. Unable to leave voicemail as patient's voicemail was not set up.   Rich Number, PharmD PGY-1 [facility] Resident

## 2022-08-26 NOTE — Progress Notes (Signed)
Patient attempted to be outreached by Estelle June, PharmD Candidate on 08/26/22 to discuss hypertension. Patient was not interested in discussing at this time.   Estelle June, PharmD Candidate 2026  [facility]   Maryan Puls, PharmD PGY-1 [facility] Resident

## 2022-08-30 ENCOUNTER — Other Ambulatory Visit: Payer: Self-pay

## 2022-08-31 ENCOUNTER — Other Ambulatory Visit: Payer: Self-pay

## 2022-08-31 ENCOUNTER — Other Ambulatory Visit (HOSPITAL_COMMUNITY): Payer: Self-pay

## 2022-09-06 ENCOUNTER — Ambulatory Visit: Payer: Self-pay | Attending: Family Medicine | Admitting: Pharmacist

## 2022-09-06 ENCOUNTER — Emergency Department (HOSPITAL_COMMUNITY)
Admission: EM | Admit: 2022-09-06 | Discharge: 2022-09-06 | Disposition: A | Discharge status: Home / Self Care | Payer: Self-pay | Attending: Emergency Medicine | Admitting: Emergency Medicine

## 2022-09-06 ENCOUNTER — Emergency Department (HOSPITAL_COMMUNITY): Payer: Self-pay

## 2022-09-06 DIAGNOSIS — N179 Acute kidney failure, unspecified: Secondary | ICD-10-CM | POA: Insufficient documentation

## 2022-09-06 DIAGNOSIS — I10 Essential (primary) hypertension: Secondary | ICD-10-CM | POA: Insufficient documentation

## 2022-09-06 DIAGNOSIS — Z79899 Other long term (current) drug therapy: Secondary | ICD-10-CM | POA: Insufficient documentation

## 2022-09-06 DIAGNOSIS — S01411A Laceration without foreign body of right cheek and temporomandibular area, initial encounter: Secondary | ICD-10-CM | POA: Insufficient documentation

## 2022-09-06 DIAGNOSIS — S02401A Maxillary fracture, unspecified, initial encounter for closed fracture: Secondary | ICD-10-CM | POA: Insufficient documentation

## 2022-09-06 DIAGNOSIS — S01419A Laceration without foreign body of unspecified cheek and temporomandibular area, initial encounter: Secondary | ICD-10-CM | POA: Insufficient documentation

## 2022-09-06 DIAGNOSIS — E119 Type 2 diabetes mellitus without complications: Secondary | ICD-10-CM | POA: Insufficient documentation

## 2022-09-06 DIAGNOSIS — Z7984 Long term (current) use of oral hypoglycemic drugs: Secondary | ICD-10-CM | POA: Insufficient documentation

## 2022-09-06 DIAGNOSIS — Z23 Encounter for immunization: Secondary | ICD-10-CM | POA: Insufficient documentation

## 2022-09-06 DIAGNOSIS — S0181XA Laceration without foreign body of other part of head, initial encounter: Secondary | ICD-10-CM

## 2022-09-06 LAB — I-STAT CHEM 8, ED
BUN: 23 mg/dL — ABNORMAL HIGH (ref 6–20)
Calcium, Ion: 1.02 mmol/L — ABNORMAL LOW (ref 1.15–1.40)
Chloride: 101 mmol/L (ref 98–111)
Creatinine, Ser: 1.6 mg/dL — ABNORMAL HIGH (ref 0.61–1.24)
Glucose, Bld: 95 mg/dL (ref 70–99)
HCT: 43 % (ref 39.0–52.0)
Hemoglobin: 14.6 g/dL (ref 13.0–17.0)
Potassium: 3.9 mmol/L (ref 3.5–5.1)
Sodium: 135 mmol/L (ref 135–145)
TCO2: 22 mmol/L (ref 22–32)

## 2022-09-06 LAB — ABO/RH: ABO/RH(D): O POS

## 2022-09-06 MED ORDER — OXYCODONE HCL 5 MG PO TABS
5.0000 mg | ORAL_TABLET | Freq: Four times a day (QID) | ORAL | 0 refills | Status: AC | PRN
  Filled 2022-09-06: qty 20, 5d supply, fill #0

## 2022-09-06 MED ORDER — LACTATED RINGERS IV BOLUS
1000.0000 mL | Freq: Once | INTRAVENOUS | Status: AC
  Administered 2022-09-06: 1000 mL via INTRAVENOUS

## 2022-09-06 MED ORDER — CEFAZOLIN SODIUM-DEXTROSE 2-4 GM/100ML-% IV SOLN
2.0000 g | Freq: Once | INTRAVENOUS | Status: AC
  Administered 2022-09-06: 2 g via INTRAVENOUS

## 2022-09-06 MED ORDER — TETANUS-DIPHTH-ACELL PERTUSSIS 5-2.5-18.5 LF-MCG/0.5 IM SUSY
PREFILLED_SYRINGE | INTRAMUSCULAR | Status: AC
  Administered 2022-09-06: 0.5 mL via INTRAMUSCULAR
  Filled 2022-09-06: qty 0.5

## 2022-09-06 MED ORDER — CLINDAMYCIN HCL 300 MG PO CAPS: 300.0000 mg | ORAL_CAPSULE | Freq: Three times a day (TID) | ORAL | 0 refills | Status: DC

## 2022-09-06 MED ORDER — FENTANYL CITRATE PF 50 MCG/ML IJ SOSY
50.0000 ug | PREFILLED_SYRINGE | Freq: Once | INTRAMUSCULAR | Status: AC
  Administered 2022-09-06: 50 ug via INTRAVENOUS
  Filled 2022-09-06: qty 1

## 2022-09-06 MED ORDER — CLINDAMYCIN HCL 300 MG PO CAPS
300.0000 mg | ORAL_CAPSULE | Freq: Three times a day (TID) | ORAL | 0 refills | Status: DC
  Filled 2022-09-07: qty 15, 5d supply, fill #0
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
## 2022-09-06 NOTE — Discharge Instructions (Addendum)
Apply antibiotic ointment to the cheek laceration closure site twice daily.  Please take the antibiotic  as prescribed.  If you have any acute bleeding, severe pain, difficulty breathing, expanding redness around the wound, red streaks coming from the wound, pus drainage please return to the emergency department.

## 2022-09-06 NOTE — ED Triage Notes (Signed)
Pt arrives POV c/o laceration to right cheek
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
## 2022-09-07 ENCOUNTER — Other Ambulatory Visit: Payer: Self-pay

## 2022-09-07 LAB — TYPE AND SCREEN
ABO/RH(D): O POS
Antibody Screen: POSITIVE
Donor AG Type: NEGATIVE
Donor AG Type: NEGATIVE
PT AG Type: NEGATIVE
Unit division: 0
Unit division: 0

## 2022-09-07 LAB — BPAM RBC
Blood Product Expiration Date: 202404122359
Blood Product Expiration Date: 202404182359
ISSUE DATE / TIME: 202403161934
Unit Type and Rh: 5100
Unit Type and Rh: 5100

## 2022-09-07 NOTE — Progress Notes (Signed)
Orthopedic Tech Progress Note Patient Details:  Chad Wolfe 07/06/1965 [id number]  Patient ID: Chad Wolfe, male   DOB: 1965-06-20, 57 y.o.   MRN: [id number] I attended trauma page. Karolee Stamps 09/07/2022, 7:14 AM

## 2022-09-09 ENCOUNTER — Other Ambulatory Visit: Payer: Self-pay

## 2022-09-14 ENCOUNTER — Inpatient Hospital Stay: Payer: Self-pay | Admitting: Family Medicine

## 2022-09-15 ENCOUNTER — Ambulatory Visit: Payer: Self-pay | Admitting: Family Medicine

## 2022-09-15 ENCOUNTER — Ambulatory Visit (INDEPENDENT_AMBULATORY_CARE_PROVIDER_SITE_OTHER): Payer: Self-pay | Admitting: Family Medicine

## 2022-09-15 VITALS — BP 113/70 | HR 81 | Temp 98.1°F | Resp 16 | Wt 191.2 lb

## 2022-09-15 DIAGNOSIS — F17210 Nicotine dependence, cigarettes, uncomplicated: Secondary | ICD-10-CM

## 2022-09-15 DIAGNOSIS — S0181XA Laceration without foreign body of other part of head, initial encounter: Secondary | ICD-10-CM
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
## 2022-09-20 ENCOUNTER — Encounter: Payer: Self-pay | Admitting: Family Medicine

## 2022-09-30 ENCOUNTER — Other Ambulatory Visit: Payer: Self-pay

## 2022-10-01 ENCOUNTER — Other Ambulatory Visit: Payer: Self-pay

## 2022-10-21 ENCOUNTER — Other Ambulatory Visit: Payer: Self-pay

## 2022-10-22 ENCOUNTER — Other Ambulatory Visit: Payer: Self-pay

## 2022-10-22 ENCOUNTER — Other Ambulatory Visit: Payer: Self-pay | Admitting: Family Medicine

## 2022-10-22 DIAGNOSIS — E1142 Type 2 diabetes mellitus with diabetic polyneuropathy: Secondary | ICD-10-CM

## 2022-10-22 MED ORDER — PREGABALIN 200 MG PO CAPS
200.0000 mg | ORAL_CAPSULE | Freq: Three times a day (TID) | ORAL | 2 refills | Status: DC
Start: 2022-10-22 — End: 2023-01-24
  Filled 2022-10-22 – 2022-11-02 (×2): qty 90, 30d supply, fill #0
  Filled 2022-11-28 – 2022-11-30 (×2): qty 90, 30d supply, fill #1
  Filled 2022-12-29: qty 90, 30d supply, fill #2

## 2022-10-22 MED ORDER — GLIPIZIDE 10 MG PO TABS
10.0000 mg | ORAL_TABLET | Freq: Two times a day (BID) | ORAL | 3 refills | Status: DC
  Filled 2022-10-22 – 2022-11-28 (×2): qty 60, 30d supply, fill #0

## 2022-10-28 ENCOUNTER — Other Ambulatory Visit: Payer: Self-pay

## 2022-11-02 ENCOUNTER — Other Ambulatory Visit: Payer: Self-pay

## 2022-11-08 ENCOUNTER — Ambulatory Visit: Payer: Self-pay | Admitting: *Deleted

## 2022-11-08 NOTE — Telephone Encounter (Signed)
  Chief Complaint: feet swelling- bilateral, L is worse with swelling into leg Symptoms: swelling, redness, no pain Frequency: 3 days Pertinent Negatives: Patient denies chest pain, difficulty breathing  Disposition: [] ED /[] Urgent Care (no appt availability in office) / [x] Appointment(In office/virtual)/ []  [facility]/ [] Home Care/ [] Refused Recommended Disposition /[] [facility]/ []  Follow-up with PCP Additional Notes: Patient provider is out of office this week- first available appointment scheduled- is out of disposition- patient would like to be seen earlier if possible- will send note for review.   Summary: Swelling in his feet   Pt has been experiencing swelling in his feet, he is diabetic and concerned  Best contact: [phone]         Reason for Disposition  [1] MODERATE leg swelling (e.g., swelling extends up to knees) AND [2] new-onset or worsening  Answer Assessment - Initial Assessment Questions 1. ONSET: "When did the swelling start?" (e.g., minutes, hours, days)     3 days ago 2. LOCATION: "What part of the leg is swollen?"  "Are both legs swollen or just one leg?"     Left foot worse, bilateral, left leg calf 3. SEVERITY: "How bad is the swelling?" (e.g., localized; mild, moderate, severe)   - Localized: Small area of swelling localized to one leg.   - MILD pedal edema: Swelling limited to foot and ankle, pitting edema < 1/4 inch (6 mm) deep, rest and elevation eliminate most or all swelling.   - MODERATE edema: Swelling of lower leg to knee, pitting edema > 1/4 inch (6 mm) deep, rest and elevation only partially reduce swelling.   - SEVERE edema: Swelling extends above knee, facial or hand swelling present.      moderate 4. REDNESS: "Does the swelling look red or infected?"     Yes- skin peeled 5. PAIN: "Is the swelling painful to touch?" If Yes, ask: "How painful is it?"   (Scale 1-10; mild, moderate or severe)     No pain- itching 6.  FEVER: "Do you have a fever?" If Yes, ask: "What is it, how was it measured, and when did it start?"      no 7. CAUSE: "What do you think is causing the leg swelling?"     unsure 8. MEDICAL HISTORY: "Do you have a history of blood clots (e.g., DVT), cancer, heart failure, kidney disease, or liver failure?"     Diabetes, high BP 9. RECURRENT SYMPTOM: "Have you had leg swelling before?" If Yes, ask: "When was the last time?" "What happened that time?"     new 10. OTHER SYMPTOMS: "Do you have any other symptoms?" (e.g., chest pain, difficulty breathing)       no  Protocols used: Leg Swelling and Edema-A-AH

## 2022-11-09 NOTE — Progress Notes (Unsigned)
Patient ID: Chad Wolfe, male    DOB: [birth year]  MRN: [id number]  CC: Follow-Up  Subjective: Chad Wolfe is a 57 y.o. male who presents for follow-up.   His concerns today include:  11/08/2022 per triage RN call note:  Chief Complaint: feet swelling- bilateral, L is worse with swelling into leg Symptoms: swelling, redness, no pain Frequency: 3 days Pertinent Negatives: Patient denies chest pain, difficulty breathing  Disposition: [] ED /[] Urgent Care (no appt availability in office) / [x] Appointment(In office/virtual)/ []  [place] [facility]/ [] Home Care/ [] Refused Recommended Disposition /[] [facility]/ []  Follow-up with PCP Additional Notes: Patient provider is out of office this week- first available appointment scheduled- is out of disposition- patient would like to be seen earlier if possible- will send note for review.       Summary: Swelling in his feet    Pt has been experiencing swelling in his feet, he is diabetic and concerned  Best contact: [phone]          Reason for Disposition  [1] MODERATE leg swelling (e.g., swelling extends up to knees) AND [2] new-onset or worsening  Answer Assessment - Initial Assessment Questions 1. ONSET: "When did the swelling start?" (e.g., minutes, hours, days)     3 days ago 2. LOCATION: "What part of the leg is swollen?"  "Are both legs swollen or just one leg?"     Left foot worse, bilateral, left leg calf 3. SEVERITY: "How bad is the swelling?" (e.g., localized; mild, moderate, severe)   - Localized: Small area of swelling localized to one leg.   - MILD pedal edema: Swelling limited to foot and ankle, pitting edema < 1/4 inch (6 mm) deep, rest and elevation eliminate most or all swelling.   - MODERATE edema: Swelling of lower leg to knee, pitting edema > 1/4 inch (6 mm) deep, rest and elevation only partially reduce swelling.   - SEVERE edema: Swelling extends above knee, facial or hand swelling  present.      moderate 4. REDNESS: "Does the swelling look red or infected?"     Yes- skin peeled 5. PAIN: "Is the swelling painful to touch?" If Yes, ask: "How painful is it?"   (Scale 1-10; mild, moderate or severe)     No pain- itching 6. FEVER: "Do you have a fever?" If Yes, ask: "What is it, how was it measured, and when did it start?"      no 7. CAUSE: "What do you think is causing the leg swelling?"     unsure 8. MEDICAL HISTORY: "Do you have a history of blood clots (e.g., DVT), cancer, heart failure, kidney disease, or liver failure?"     Diabetes, high BP 9. RECURRENT SYMPTOM: "Have you had leg swelling before?" If Yes, ask: "When was the last time?" "What happened that time?"     new 10. OTHER SYMPTOMS: "Do you have any other symptoms?" (e.g., chest pain, difficulty breathing)       no  Protocols used: Leg Swelling and Edema-A-AH  Today's visit 11/10/2022: - Reports swelling of feet improved because he is "soaking feet" using alcohol, salt, and water. Still having some left lower extremity swelling. He denies associated red flag symptoms.  - Reports he is diabetic and works in [employer] and standing long hours. Would like referral for diabetic work shoes.  - No further issues/concerns for discussion today.   Patient Active Problem List   Diagnosis Date Noted   Cheek laceration 09/06/2022  Maxillary fracture (HCC) 09/06/2022   Uninsured 02/04/2021   Transaminitis 02/04/2021   BMI 22.0-22.9, adult 01/15/2021   DKA (diabetic ketoacidosis) (HCC) 01/05/2021   DKA, type 2 (HCC) 01/04/2021   Open finger dislocation 02/26/2017   Diabetes mellitus type 2 in nonobese (HCC) 01/21/2016   Fall from chair 05/15/2015   Alcohol abuse 05/05/2015   Pain, dental 03/27/2013   HTN (hypertension) 04/25/2012   Chronic hepatitis C virus infection (HCC) 11/15/2007   ABDOMINAL PAIN-EPIGASTRIC 11/15/2007     Current Outpatient Medications on File Prior to Visit  Medication Sig Dispense  Refill   albuterol (VENTOLIN HFA) 108 (90 Base) MCG/ACT inhaler Inhale 2 puffs into the lungs every 6 (six) hours as needed for wheezing or shortness of breath. 6.7 g 0   clindamycin (CLEOCIN) 300 MG capsule Take 1 capsule (300 mg total) by mouth 3 (three) times daily. 15 capsule 0   gabapentin (NEURONTIN) 600 MG tablet Take 1 tablet (600 mg total) by mouth 3 (three) times daily. 90 tablet 5   glipiZIDE (GLUCOTROL) 10 MG tablet Take 1 tablet (10 mg total) by mouth 2 (two) times daily before a meal. 60 tablet 3   glucose blood (TRUE METRIX BLOOD GLUCOSE TEST) test strip Use as instructed 100 each 12   hydrochlorothiazide (HYDRODIURIL) 25 MG tablet Take 1 tablet (25 mg total) by mouth daily. 90 tablet 0   lisinopril (ZESTRIL) 20 MG tablet Take 1 tablet (20 mg total) by mouth daily. 90 tablet 0   lisinopril-hydrochlorothiazide (ZESTORETIC) 20-25 MG tablet Take 1 tablet by mouth daily. 90 tablet 1   Omega-3 Fatty Acids (FISH OIL) 1000 MG CAPS Take by mouth.     predniSONE (DELTASONE) 50 MG tablet Take 1 tablet (50 mg total) by mouth daily with breakfast. 5 tablet 0   pregabalin (LYRICA) 200 MG capsule Take 1 capsule (200 mg total) by mouth in the morning, at noon, and at bedtime. 90 capsule 2   TRUEplus Lancets 28G MISC use as directed 100 each 12   No current facility-administered medications on file prior to visit.    Allergies  Allergen Reactions   Ibuprofen Other (See Comments)    Stomach upset   Aspirin Other (See Comments)   Other Other (See Comments)    MD told him not to take [facility] due to Hep C   Tylenol [Acetaminophen] Other (See Comments)    HEP C    Social History   Socioeconomic History   Marital status: Divorced    Spouse name: Not on file   Number of children: Not on file   Years of education: Not on file   Highest education level: Not on file  Occupational History   Not on file  Tobacco Use   Smoking status: Some Days    Types: Cigarettes    Passive exposure: Current    Smokeless tobacco: Never  Substance and Sexual Activity   Alcohol use: Yes    Alcohol/week: 0.0 standard drinks of alcohol    Comment: drank a quart of beer this morning PT DENIES 09/26/15   Drug use: No    Types: Cocaine, Oxycodone    Comment: oxycodone 2 days ago "my sister gave me one"; no cocaine use   Sexual activity: Not Currently    Comment: opiates  Other Topics Concern   Not on file  Social History Narrative   Not on file   Social Determinants of Health   Financial Resource Strain: Not on file  Food Insecurity: Not  on file  Transportation Needs: Not on file  Physical Activity: Not on file  Stress: Not on file  Social Connections: Not on file  Intimate Partner Violence: Not on file    Family History  Problem Relation Age of Onset   Diabetes Father    Hypertension Father    Diabetes Sister    Hypertension Sister    Diabetes Brother    Hypertension Brother     Past Surgical History:  Procedure Laterality Date   I & D EXTREMITY Right 02/26/2017   Procedure: IRRIGATION AND DEBRIDEMENT AND REDUCTION OF RIGHT MIDDLE FINGER;  Surgeon: Dominica Severin, MD;  Location: [facility] OR;  Service: Orthopedics;  Laterality: Right;   STOMACH SURGERY      ROS: Review of Systems Negative except as stated above  PHYSICAL EXAM: BP 91/68   Pulse 93   Temp 98.1 F (36.7 C) (Oral)   Resp 16   Wt 193 lb 12.8 oz (87.9 kg)   SpO2 93%   BMI 28.62 kg/m    Physical Exam HENT:     Head: Normocephalic and atraumatic.     Nose: Nose normal.     Mouth/Throat:     Mouth: Mucous membranes are moist.     Pharynx: Oropharynx is clear.  Eyes:     Extraocular Movements: Extraocular movements intact.     Conjunctiva/sclera: Conjunctivae normal.     Pupils: Pupils are equal, round, and reactive to light.  Cardiovascular:     Rate and Rhythm: Normal rate and regular rhythm.     Pulses: Normal pulses.     Heart sounds: Normal heart sounds.  Pulmonary:     Effort: Pulmonary effort is  normal.     Breath sounds: Normal breath sounds.  Musculoskeletal:        General: Normal range of motion.     Right shoulder: Normal.     Left shoulder: Normal.     Right upper arm: Normal.     Left upper arm: Normal.     Right elbow: Normal.     Left elbow: Normal.     Right forearm: Normal.     Left forearm: Normal.     Right wrist: Normal.     Left wrist: Normal.     Right hand: Normal.     Left hand: Normal.     Cervical back: Normal, normal range of motion and neck supple.     Thoracic back: Normal.     Lumbar back: Normal.     Right hip: Normal.     Left hip: Normal.     Right upper leg: Normal.     Left upper leg: Normal.     Right knee: Normal.     Left knee: Normal.     Right lower leg: Normal.     Left lower leg: Edema present.     Right ankle: Normal.     Left ankle: Normal.     Right foot: Normal.     Left foot: Normal.  Feet:     Right foot:     Toenail Condition: Right toenails are abnormally thick.     Left foot:     Toenail Condition: Left toenails are abnormally thick.  Neurological:     General: No focal deficit present.     Mental Status: He is alert and oriented to person, place, and time.  Psychiatric:        Mood and Affect: Mood normal.  Behavior: Behavior normal.    ASSESSMENT AND PLAN: 1. Edema, lower extremity - Furosemide as prescribed. Counseled on medication adherence/adverse effects.  - Vascular Ultrasound lower extremities for further evaluation. - Routine screening.   - Follow-up with primary provider as scheduled.  - VAS [place] LOWER EXTREMITY VENOUS (DVT); Future - Basic Metabolic Panel - furosemide (LASIX) 20 MG tablet; Take 1 tablet (20 mg total) by mouth daily as needed.  Dispense: 5 tablet; Refill: 0  2. Encounter for diabetic foot exam (HCC) 3. Edema of both feet - Referral to Podiatry for further evaluation/management. - Ambulatory referral to Podiatry   Patient was given the opportunity to ask questions.  Patient  verbalized understanding of the plan and was able to repeat key elements of the plan. Patient was given clear instructions to go to Emergency Department or return to medical center if symptoms don't improve, worsen, or new problems develop.The patient verbalized understanding.   Orders Placed This Encounter  Procedures   Basic Metabolic Panel   Ambulatory referral to Podiatry   VAS [place] LOWER EXTREMITY VENOUS (DVT)     Requested Prescriptions   Signed Prescriptions Disp Refills   furosemide (LASIX) 20 MG tablet 5 tablet 0    Sig: Take 1 tablet (20 mg total) by mouth daily as needed.    Return for Follow-Up or next available [name], MD.  Rema Fendt, NP

## 2022-11-10 ENCOUNTER — Other Ambulatory Visit: Payer: Self-pay

## 2022-11-10 ENCOUNTER — Ambulatory Visit (INDEPENDENT_AMBULATORY_CARE_PROVIDER_SITE_OTHER): Payer: Self-pay | Admitting: Family

## 2022-11-10 VITALS — BP 91/68 | HR 93 | Temp 98.1°F | Resp 16 | Wt 193.8 lb

## 2022-11-10 DIAGNOSIS — Z7984 Long term (current) use of oral hypoglycemic drugs: Secondary | ICD-10-CM

## 2022-11-10 DIAGNOSIS — F17210 Nicotine dependence, cigarettes, uncomplicated: Secondary | ICD-10-CM

## 2022-11-10 DIAGNOSIS — R600 Localized edema: Secondary | ICD-10-CM

## 2022-11-10 DIAGNOSIS — E119 Type 2 diabetes mellitus without complications: Secondary | ICD-10-CM

## 2022-11-10 MED ORDER — FUROSEMIDE 20 MG PO TABS
20.0000 mg | ORAL_TABLET | Freq: Every day | ORAL | 0 refills | Status: DC | PRN
Start: 2022-11-10 — End: 2023-07-21
  Filled 2022-11-10: qty 5, 5d supply, fill #0
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
## 2022-11-11 ENCOUNTER — Other Ambulatory Visit: Payer: Self-pay | Admitting: Family

## 2022-11-11 ENCOUNTER — Other Ambulatory Visit: Payer: Self-pay

## 2022-11-11 DIAGNOSIS — N184 Chronic kidney disease, stage 4 (severe): Secondary | ICD-10-CM

## 2022-11-11 LAB — BASIC METABOLIC PANEL
BUN/Creatinine Ratio: 14 (ref 9–20)
BUN: 44 mg/dL — ABNORMAL HIGH (ref 6–24)
CO2: 23 mmol/L (ref 20–29)
Calcium: 10.1 mg/dL (ref 8.7–10.2)
Chloride: 101 mmol/L (ref 96–106)
Creatinine, Ser: 3.22 mg/dL — ABNORMAL HIGH (ref 0.76–1.27)
Glucose: 155 mg/dL — ABNORMAL HIGH (ref 70–99)
Potassium: 5.6 mmol/L — ABNORMAL HIGH (ref 3.5–5.2)
Sodium: 138 mmol/L (ref 134–144)
eGFR: 21 mL/min/{1.73_m2} — ABNORMAL LOW (ref >59)

## 2022-11-19 ENCOUNTER — Ambulatory Visit (INDEPENDENT_AMBULATORY_CARE_PROVIDER_SITE_OTHER): Payer: Medicaid Other | Admitting: Podiatry

## 2022-11-19 ENCOUNTER — Other Ambulatory Visit: Payer: Self-pay

## 2022-11-19 DIAGNOSIS — E119 Type 2 diabetes mellitus without complications: Secondary | ICD-10-CM

## 2022-11-19 DIAGNOSIS — M2041 Other hammer toe(s) (acquired), right foot: Secondary | ICD-10-CM

## 2022-11-19 DIAGNOSIS — B351 Tinea unguium: Secondary | ICD-10-CM | POA: Diagnosis not present

## 2022-11-19 DIAGNOSIS — M2042 Other hammer toe(s) (acquired), left foot: Secondary | ICD-10-CM | POA: Diagnosis not present

## 2022-11-19 DIAGNOSIS — M79674 Pain in right toe(s): Secondary | ICD-10-CM | POA: Diagnosis not present

## 2022-11-19 DIAGNOSIS — M79675 Pain in left toe(s): Secondary | ICD-10-CM | POA: Diagnosis not present

## 2022-11-19 MED ORDER — AMMONIUM LACTATE 5 % EX LOTN
1.0000 | TOPICAL_LOTION | Freq: Two times a day (BID) | CUTANEOUS | 0 refills | Status: DC
  Filled 2022-11-19: qty 30, 15d supply, fill #0

## 2022-11-19 NOTE — Progress Notes (Signed)
  Subjective:  Patient ID: Chad Wolfe, male    DOB: 23-Apr-1966,  MRN: [id number]  Chief Complaint  Patient presents with   Diabetes    Foot exam  Nail trim    57 y.o. male returns for the above complaint.  Patient presents with thickened elongated dystrophic mycotic toenails x 10 mild pain on palpation or shoe with pressure pain.  Patient is a diabeticLast A1c of 7.6.  He would also like to get diabetic shoes.  He also has dry skin for which she would like to discuss treatment options  Objective:  There were no vitals filed for this visit. Podiatric Exam: Vascular: dorsalis pedis and posterior tibial pulses are palpable bilateral. Capillary return is immediate. Temperature gradient is WNL. Skin turgor WNL  Sensorium: Normal Semmes Weinstein monofilament test. Normal tactile sensation bilaterally. Nail Exam: Pt has thick disfigured discolored nails with subungual debris noted bilateral entire nail hallux through fifth toenails.  Pain on palpation to the nails. Ulcer Exam: There is no evidence of ulcer or pre-ulcerative changes or infection. Orthopedic Exam: Muscle tone and strength are WNL. No limitations in general ROM. No crepitus or effusions noted.  Skin: No Porokeratosis. No infection or ulcers.  Severe dryness noted to bilateral lower extremity no skin fissures noted.    Assessment & Plan:   1. Pain due to onychomycosis of toenails of both feet   2. Hammertoe, bilateral   3. Diabetes mellitus type 2 in nonobese [facility]) [E11.9]     Patient was evaluated and treated and all questions answered.  Hammertoe bilateral -Patient has hammertoe deformity setting of uncontrolled diabetes patient will benefit from diabetic shoes.  He is given prescription for diabetic shoes  Xerosis skin I explained to the patient the etiology of xerosis and various treatment options were extensively discussed.  I explained to the patient the importance of maintaining moisturization of the skin with  application of over-the-counter lotion such as Eucerin or Luciderm.  Given the patient has failed over-the-counter she will benefit from ammonium lactate ammonium lactate was sent to the pharmacy   Onychomycosis with pain  -Nails palliatively debrided as below. -Educated on self-care  Procedure: Nail Debridement Rationale: pain  Type of Debridement: manual, sharp debridement. Instrumentation: Nail nipper, rotary burr. Number of Nails: 10  Procedures and Treatment: Consent by patient was obtained for treatment procedures. The patient understood the discussion of treatment and procedures well. All questions were answered thoroughly reviewed. Debridement of mycotic and hypertrophic toenails, 1 through 5 bilateral and clearing of subungual debris. No ulceration, no infection noted.  Return Visit-Office Procedure: Patient instructed to return to the office for a follow up visit 3 months for continued evaluation and treatment.  Nicholes Rough, DPM    No follow-ups on file.

## 2022-11-22 ENCOUNTER — Ambulatory Visit (INDEPENDENT_AMBULATORY_CARE_PROVIDER_SITE_OTHER): Payer: Medicaid Other | Admitting: Family Medicine

## 2022-11-22 ENCOUNTER — Encounter: Payer: Self-pay | Admitting: Family Medicine

## 2022-11-22 VITALS — BP 103/68 | HR 75 | Temp 98.1°F | Resp 16 | Wt 193.4 lb

## 2022-11-22 DIAGNOSIS — Z7984 Long term (current) use of oral hypoglycemic drugs: Secondary | ICD-10-CM

## 2022-11-22 DIAGNOSIS — F17210 Nicotine dependence, cigarettes, uncomplicated: Secondary | ICD-10-CM | POA: Diagnosis not present

## 2022-11-22 DIAGNOSIS — F17200 Nicotine dependence, unspecified, uncomplicated: Secondary | ICD-10-CM

## 2022-11-22 DIAGNOSIS — F1010 Alcohol abuse, uncomplicated: Secondary | ICD-10-CM

## 2022-11-22 DIAGNOSIS — E11649 Type 2 diabetes mellitus with hypoglycemia without coma: Secondary | ICD-10-CM | POA: Diagnosis not present

## 2022-11-22 DIAGNOSIS — I10 Essential (primary) hypertension: Secondary | ICD-10-CM | POA: Diagnosis not present

## 2022-11-22 LAB — POCT GLYCOSYLATED HEMOGLOBIN (HGB A1C): Hemoglobin A1C: 9 % — AB (ref 4.0–5.6)

## 2022-11-23 ENCOUNTER — Other Ambulatory Visit: Payer: Self-pay

## 2022-11-23 ENCOUNTER — Encounter: Payer: Self-pay | Admitting: Family Medicine

## 2022-11-23 NOTE — Progress Notes (Signed)
Established Patient Office Visit  Subjective    Patient ID: Chad Wolfe, male    DOB: 11/06/1965  Age: 57 y.o. MRN: [id number]  CC:  Chief Complaint  Patient presents with   Follow-up   Diabetes    HPI Chad Wolfe presents for routine follow up of chronic med issues. Patient denies acute complaints or concerns.    Outpatient Encounter Medications as of 11/22/2022  Medication Sig   albuterol (VENTOLIN HFA) 108 (90 Base) MCG/ACT inhaler Inhale 2 puffs into the lungs every 6 (six) hours as needed for wheezing or shortness of breath.   ammonium lactate (LAC-HYDRIN) 5 % LOTN lotion Apply 1 Application topically 2 (two) times daily.   clindamycin (CLEOCIN) 300 MG capsule Take 1 capsule (300 mg total) by mouth 3 (three) times daily.   furosemide (LASIX) 20 MG tablet Take 1 tablet (20 mg total) by mouth daily as needed.   glipiZIDE (GLUCOTROL) 10 MG tablet Take 1 tablet (10 mg total) by mouth 2 (two) times daily before a meal.   glucose blood (TRUE METRIX BLOOD GLUCOSE TEST) test strip Use as instructed   hydrochlorothiazide (HYDRODIURIL) 25 MG tablet Take 1 tablet (25 mg total) by mouth daily.   lisinopril (ZESTRIL) 20 MG tablet Take 1 tablet (20 mg total) by mouth daily.   lisinopril-hydrochlorothiazide (ZESTORETIC) 20-25 MG tablet Take 1 tablet by mouth daily.   Omega-3 Fatty Acids (FISH OIL) 1000 MG CAPS Take by mouth.   predniSONE (DELTASONE) 50 MG tablet Take 1 tablet (50 mg total) by mouth daily with breakfast.   pregabalin (LYRICA) 200 MG capsule Take 1 capsule (200 mg total) by mouth in the morning, at noon, and at bedtime.   TRUEplus Lancets 28G MISC use as directed   gabapentin (NEURONTIN) 600 MG tablet Take 1 tablet (600 mg total) by mouth 3 (three) times daily.   No facility-administered encounter medications on file as of 11/22/2022.    Past Medical History:  Diagnosis Date   Diabetes mellitus type 2 in nonobese (HCC) 01/21/2016   Gastric ulcer    Hepatitis C     Hyperlipidemia    Hypertension    Substance abuse (HCC)     Past Surgical History:  Procedure Laterality Date   I & D EXTREMITY Right 02/26/2017   Procedure: IRRIGATION AND DEBRIDEMENT AND REDUCTION OF RIGHT MIDDLE FINGER;  Surgeon: Dominica Severin, MD;  Location: [facility] OR;  Service: Orthopedics;  Laterality: Right;   STOMACH SURGERY      Family History  Problem Relation Age of Onset   Diabetes Father    Hypertension Father    Diabetes Sister    Hypertension Sister    Diabetes Brother    Hypertension Brother     Social History   Socioeconomic History   Marital status: Divorced    Spouse name: Not on file   Number of children: Not on file   Years of education: Not on file   Highest education level: Not on file  Occupational History   Not on file  Tobacco Use   Smoking status: Some Days    Types: Cigarettes    Passive exposure: Current   Smokeless tobacco: Never  Substance and Sexual Activity   Alcohol use: Yes    Alcohol/week: 0.0 standard drinks of alcohol    Comment: drank a quart of beer this morning PT DENIES 09/26/15   Drug use: No    Types: Cocaine, Oxycodone    Comment: oxycodone 2 days ago "my  sister gave me one"; no cocaine use   Sexual activity: Not Currently    Comment: opiates  Other Topics Concern   Not on file  Social History Narrative   Not on file   Social Determinants of Health   Financial Resource Strain: Not on file  Food Insecurity: Not on file  Transportation Needs: Not on file  Physical Activity: Not on file  Stress: Not on file  Social Connections: Not on file  Intimate Partner Violence: Not on file    Review of Systems  All other systems reviewed and are negative.       Objective    BP 103/68   Pulse 75   Temp 98.1 F (36.7 C) (Oral)   Resp 16   Wt 193 lb 6.4 oz (87.7 kg)   SpO2 94%   BMI 28.56 kg/m   Physical Exam Vitals and nursing note reviewed.  Constitutional:      General: He is not in acute  distress. Cardiovascular:     Rate and Rhythm: Normal rate and regular rhythm.  Pulmonary:     Effort: Pulmonary effort is normal. No respiratory distress.     Breath sounds: No wheezing.  Abdominal:     Palpations: Abdomen is soft.     Tenderness: There is no abdominal tenderness.  Neurological:     General: No focal deficit present.     Mental Status: He is alert and oriented to person, place, and time.         Assessment & Plan:   1. Uncontrolled type 2 diabetes mellitus with hypoglycemia without coma (HCC) Increasing A1c. Referral to [facility] for med management. Discussed compliance - POCT glycosylated hemoglobin (Hb A1C)  2. Essential hypertension Appears stable. Continue   3. Alcohol abuse   4. Smoking Discussed reduction and cessation    Return in about 3 months (around 02/22/2023).   Chad Raymond, MD

## 2022-11-29 ENCOUNTER — Ambulatory Visit: Payer: Medicaid Other | Admitting: Pharmacist

## 2022-11-29 ENCOUNTER — Other Ambulatory Visit (HOSPITAL_COMMUNITY): Payer: Self-pay

## 2022-11-29 ENCOUNTER — Other Ambulatory Visit: Payer: Self-pay

## 2022-11-29 NOTE — Progress Notes (Deleted)
S:     No chief complaint on file.  57 y.o. male who presents for diabetes evaluation, education, and management.  PMH is significant for T2DM, hx DKA, HTN, chronic Hep C.  Patient was referred and last seen by Primary Care Provider, Dr. Andrey Campanile, on 11/22/2022.  At last visit, A1c was 9% up from 7.6 % in February.   Today, patient arrives in *** good spirits and presents without *** any assistance. ***  Patient reports Diabetes was diagnosed in ***.   Family/Social History:  -Fhx: DM, HTN  Current diabetes medications include: Glipizide 10 mg BID Current hypertension medications include: lisinopril-hydrochlorothiazide 20-25 mg daily,  Current hyperlipidemia medications include: ***  Patient reports adherence to taking all medications as prescribed.  *** Patient denies adherence with medications, reports missing *** medications *** times per week, on average.  Do you feel that your medications are working for you? {YES NO:22349} Have you been experiencing any side effects to the medications prescribed? {YES NO:22349} Do you have any problems obtaining medications due to transportation or finances? {YES [id number] Insurance coverage: [place] [employer]  Patient {Actions; denies-reports:120008} hypoglycemic events.  Reported home fasting blood sugars: ***  Reported 2 hour post-meal/random blood sugars: ***.  Patient {Actions; denies-reports:120008} nocturia (nighttime urination).  Patient {Actions; denies-reports:120008} neuropathy (nerve pain). Patient {Actions; denies-reports:120008} visual changes. Patient {Actions; denies-reports:120008} self foot exams.   Patient reported dietary habits: Eats *** meals/day Breakfast: *** Lunch: *** Dinner: *** Snacks: *** Drinks: ***  Within the past 12 months, did you worry whether your food would run out before you got money to buy more? {YES NO:22349} Within the past 12 months, did the food you bought run out, and you didn't have money  to get more? {YES NO:22349} PHQ-9 Score: ***  Patient-reported exercise habits: ***   O:   ROS  Physical Exam  7 day average blood glucose: ***  *** CGM Download:  % Time CGM is active: ***% Average Glucose: *** mg/dL Glucose Management Indicator: ***  Glucose Variability: *** (goal <36%) Time in Goal:  - Time in range 70-180: ***% - Time above range: ***% - Time below range: ***% Observed patterns:   Lab Results  Component Value Date   HGBA1C 9.0 (A) 11/22/2022   There were no vitals filed for this visit.  Lipid Panel     Component Value Date/Time   CHOL 207 (H) 10/22/2015 1244   TRIG 129 10/22/2015 1244   HDL 49 10/22/2015 1244   CHOLHDL 4.2 10/22/2015 1244   VLDL 26 10/22/2015 1244   LDLCALC 132 (H) 10/22/2015 1244    Clinical Atherosclerotic Cardiovascular Disease (ASCVD): {YES/NO:21197} The ASCVD Risk score (Arnett DK, et al., 2019) failed to calculate for the following reasons:   Cannot find a previous HDL lab   Cannot find a previous total cholesterol lab   Patient is participating in a Managed Medicaid Plan:  {MM YES/NO:27447::"Yes"}   A/P: Diabetes longstanding *** currently ***. Patient is *** able to verbalize appropriate hypoglycemia management plan. Medication adherence appears ***. Control is suboptimal due to ***. -{Meds adjust:18428} basal insulin *** Lantus/Basaglar/Semglee (insulin glargine) *** Tresiba (insulin degludec) from *** units to *** units daily in the morning. Patient will continue to titrate 1 unit every *** days if fasting blood sugar > 100mg /dl until fasting blood sugars reach goal or next visit.  -{Meds adjust:18428} rapid insulin *** Novolog (insulin aspart) *** Humalog (insulin lispro) from *** to ***.  -{Meds adjust:18428} GLP-1 *** Trulicity (dulaglutide) ***  Ozempic (semaglutide) *** Mounjaro (tirzepatide) from *** mg to *** mg .  -{Meds adjust:18428} SGLT2-I *** Farxiga (dapagliflozin) *** Jardiance (empagliflozin) 10 mg.  Counseled on sick day rules. -{Meds adjust:18428} metformin ***.  -Patient educated on purpose, proper use, and potential adverse effects of ***.  -Extensively discussed pathophysiology of diabetes, recommended lifestyle interventions, dietary effects on blood sugar control.  -Counseled on s/sx of and management of hypoglycemia.  -Next A1c anticipated ***.   ASCVD risk - primary ***secondary prevention in patient with diabetes. Last LDL is *** not at goal of <86 *** mg/dL. ASCVD risk factors include *** and 10-year ASCVD risk score of ***. {Desc; low/moderate/high:110033} intensity statin indicated.  -{Meds adjust:18428} ***statin *** mg.   Hypertension longstanding *** currently ***. Blood pressure goal of <130/80 *** mmHg. Medication adherence ***. Blood pressure control is suboptimal due to ***. -{Meds adjust:18428} *** mg.  Written patient instructions provided. Patient verbalized understanding of treatment plan.  Total time in face to face counseling *** minutes.    Follow-up:  Pharmacist ***. PCP clinic visit in ***.  Patient seen with ***

## 2022-11-30 ENCOUNTER — Other Ambulatory Visit: Payer: Self-pay

## 2022-12-01 ENCOUNTER — Other Ambulatory Visit: Payer: Self-pay

## 2022-12-06 ENCOUNTER — Ambulatory Visit: Payer: Medicaid Other | Attending: Family Medicine | Admitting: Pharmacist

## 2022-12-06 ENCOUNTER — Other Ambulatory Visit: Payer: Self-pay

## 2022-12-06 ENCOUNTER — Encounter: Payer: Self-pay | Admitting: Pharmacist

## 2022-12-06 DIAGNOSIS — E119 Type 2 diabetes mellitus without complications: Secondary | ICD-10-CM | POA: Diagnosis not present

## 2022-12-06 DIAGNOSIS — Z794 Long term (current) use of insulin: Secondary | ICD-10-CM | POA: Diagnosis not present

## 2022-12-06 MED ORDER — INSULIN PEN NEEDLE 32G X 4 MM MISC
1 refills | Status: DC
  Filled 2022-12-06: qty 100, 100d supply, fill #0
  Filled 2023-02-28: qty 100, 100d supply, fill #1

## 2022-12-06 MED ORDER — FREESTYLE LIBRE 2 READER DEVI
0 refills | Status: AC
Start: 2022-12-06 — End: ?
  Filled 2022-12-06: qty 1, 30d supply, fill #0

## 2022-12-06 MED ORDER — FREESTYLE LIBRE 2 SENSOR MISC
3 refills | Status: AC
Start: 2022-12-06 — End: ?
  Filled 2022-12-06: qty 2, 28d supply, fill #0

## 2022-12-06 MED ORDER — LANTUS SOLOSTAR 100 UNIT/ML ~~LOC~~ SOPN
10.0000 [IU] | PEN_INJECTOR | Freq: Every day | SUBCUTANEOUS | 2 refills | Status: DC
  Filled 2022-12-06: qty 3, 30d supply, fill #0
  Filled 2023-01-04: qty 3, 30d supply, fill #1
  Filled 2023-02-03: qty 3, 30d supply, fill #2

## 2022-12-06 NOTE — Progress Notes (Signed)
    S:     No chief complaint on file.  57 y.o. male who presents for diabetes evaluation, education, and management. Patient arrives in good spirits and presents without any assistance. Patient was referred and last seen by Primary Care Provider, Dr. Andrey Campanile, on 11/22/2022. At this visit, A1c was 9.0% (up from 7.6%).  PMH is significant for HTN, T2DM w/ hx of DKA, CKD?? (Last Scr 3.22 up from 1.6 prior - Nephrology referral made), hx of hx of alcohol abuse, hx of chronic HCV w/ liver transaminitis. Patient reports Diabetes is longstanding. No thyroid cancer or pancreatitis hx. Has been in the hospital before for diabetes. No outpatient use of insulin or other injectables before.   Family/Social History:  -Fhx: DM -Tobacco: some day smoker  -Alcohol: none since his last visit  Current diabetes medications include: glipizide 10 mg BID  Patient reports adherence to taking all medications as prescribed.   Insurance coverage: [place] Medicaid  Patient denies hypoglycemic events.  Reported home fasting blood sugars: none   Reported 2 hour post-meal/random blood sugars: none.  Patient denies nocturia (nighttime urination).  Patient reports neuropathy (nerve pain). Patient denies visual changes. Patient reports self foot exams.   Patient reported dietary habits:  -Admits to drinking just water -Cut out energy drinks since last visit with Dr. 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    Lab Results  Component Value Date   HGBA1C 9.0 (A) 11/22/2022   There were no vitals filed for this visit.  Lipid Panel     Component Value Date/Time   CHOL 207 (H) 10/22/2015 1244   TRIG 129 10/22/2015 1244   HDL 49 10/22/2015 1244   CHOLHDL 4.2 10/22/2015 1244   VLDL 26 10/22/2015 1244   LDLCALC 132 (H) 10/22/2015 1244    Clinical Atherosclerotic  Cardiovascular Disease (ASCVD): No  The ASCVD Risk score (Arnett DK, et al., 2019) failed to calculate for the following reasons:   Cannot find a previous HDL lab   Cannot find a previous total cholesterol lab   Patient is participating in a Managed Medicaid Plan:  Yes   A/P: Diabetes longstanding currently uncontrolled. Patient is able to verbalize appropriate hypoglycemia management plan. Medication adherence appears appropriate, however, his kidney function limits oral options. GLP-1 RA use may be tricky with his alcohol use history and risk of pancreatitis. I question the benefit he is getting from glipizide at this point since he's been on it for quite some time. Will stop this and start a low dose basal insulin. -Discontinued glipizide.  -Start Lantus 10u once daily.  -Pen needles sent.  -CGM rxn (Libre 2) sent.  -Patient educated on purpose, proper use, and potential adverse effects of Lantus.  -Extensively discussed pathophysiology of diabetes, recommended lifestyle interventions, dietary effects on blood sugar control.  -Counseled on s/sx of and management of hypoglycemia.  -Next A1c anticipated 9/24.   Written patient instructions provided. Patient verbalized understanding of treatment plan.  Total time in face to face counseling 30 minutes.    Follow-up:  Pharmacist in 1 month.  Butch Penny, PharmD, Patsy [place], CPP Clinical Pharmacist [facility] [phone]

## 2022-12-07 ENCOUNTER — Other Ambulatory Visit: Payer: Self-pay

## 2022-12-08 ENCOUNTER — Other Ambulatory Visit: Payer: Self-pay

## 2022-12-08 ENCOUNTER — Ambulatory Visit: Payer: Self-pay | Admitting: Family Medicine

## 2022-12-14 ENCOUNTER — Other Ambulatory Visit: Payer: Self-pay

## 2022-12-20 ENCOUNTER — Ambulatory Visit: Payer: Self-pay | Admitting: Family Medicine

## 2022-12-24 ENCOUNTER — Other Ambulatory Visit: Payer: Self-pay | Admitting: Nephrology

## 2022-12-24 DIAGNOSIS — N182 Chronic kidney disease, stage 2 (mild): Secondary | ICD-10-CM

## 2022-12-28 ENCOUNTER — Other Ambulatory Visit: Payer: Self-pay

## 2022-12-29 ENCOUNTER — Other Ambulatory Visit: Payer: Self-pay

## 2022-12-30 ENCOUNTER — Other Ambulatory Visit: Payer: Self-pay

## 2023-01-04 ENCOUNTER — Other Ambulatory Visit: Payer: Self-pay

## 2023-01-05 ENCOUNTER — Other Ambulatory Visit: Payer: Self-pay

## 2023-01-06 ENCOUNTER — Ambulatory Visit: Payer: Medicaid Other | Admitting: Pharmacist

## 2023-01-24 ENCOUNTER — Other Ambulatory Visit: Payer: Self-pay | Admitting: Family Medicine

## 2023-01-24 ENCOUNTER — Other Ambulatory Visit: Payer: Self-pay

## 2023-01-24 DIAGNOSIS — E1142 Type 2 diabetes mellitus with diabetic polyneuropathy: Secondary | ICD-10-CM

## 2023-01-24 MED ORDER — PREGABALIN 200 MG PO CAPS
200.0000 mg | ORAL_CAPSULE | Freq: Three times a day (TID) | ORAL | 2 refills | Status: DC
Start: 2023-01-24 — End: 2023-04-19
  Filled 2023-01-24: qty 90, 30d supply, fill #0
  Filled 2023-02-28: qty 90, 30d supply, fill #1
  Filled 2023-03-28: qty 90, 30d supply, fill #2

## 2023-01-27 ENCOUNTER — Other Ambulatory Visit: Payer: Self-pay

## 2023-02-03 ENCOUNTER — Other Ambulatory Visit: Payer: Self-pay

## 2023-02-22 ENCOUNTER — Ambulatory Visit: Payer: Medicaid Other | Admitting: Family Medicine

## 2023-02-28 ENCOUNTER — Other Ambulatory Visit: Payer: Self-pay | Admitting: Family Medicine

## 2023-02-28 ENCOUNTER — Other Ambulatory Visit: Payer: Self-pay

## 2023-03-01 ENCOUNTER — Other Ambulatory Visit: Payer: Self-pay

## 2023-03-01 MED ORDER — LANTUS SOLOSTAR 100 UNIT/ML ~~LOC~~ SOPN
10.0000 [IU] | PEN_INJECTOR | Freq: Every day | SUBCUTANEOUS | 2 refills | Status: DC
  Filled 2023-03-01: qty 3, 30d supply, fill #0

## 2023-03-02 ENCOUNTER — Other Ambulatory Visit: Payer: Self-pay

## 2023-03-02 ENCOUNTER — Ambulatory Visit: Payer: Medicaid Other | Attending: Family Medicine | Admitting: Pharmacist

## 2023-03-02 ENCOUNTER — Encounter: Payer: Self-pay | Admitting: Pharmacist

## 2023-03-02 VITALS — BP 139/90 | HR 83

## 2023-03-02 DIAGNOSIS — E119 Type 2 diabetes mellitus without complications: Secondary | ICD-10-CM

## 2023-03-02 DIAGNOSIS — I10 Essential (primary) hypertension: Secondary | ICD-10-CM | POA: Diagnosis not present

## 2023-03-02 DIAGNOSIS — Z7984 Long term (current) use of oral hypoglycemic drugs: Secondary | ICD-10-CM

## 2023-03-02 LAB — POCT GLYCOSYLATED HEMOGLOBIN (HGB A1C): HbA1c, POC (controlled diabetic range): 8.8 % — AB (ref 0.0–7.0)

## 2023-03-02 MED ORDER — LANTUS SOLOSTAR 100 UNIT/ML ~~LOC~~ SOPN
20.0000 [IU] | PEN_INJECTOR | Freq: Every day | SUBCUTANEOUS | 2 refills | Status: DC
  Filled 2023-03-02: qty 6, 30d supply, fill #0
  Filled 2023-03-30: qty 6, 30d supply, fill #1
  Filled 2023-05-02: qty 6, 30d supply, fill #2

## 2023-03-02 NOTE — Progress Notes (Signed)
S:    57 y.o. male who presents for diabetes evaluation, education, and management. PMH is significant for HTN, T2DM w/ hx of DKA, CKD?? (Last Scr 3.22 up from 1.6 prior - Nephrology referral made), hx of hx of alcohol abuse, hx of chronic HCV w/ liver transaminitis. Patient reports Diabetes is longstanding. No thyroid cancer or pancreatitis hx. Has been in the hospital before for diabetes. No outpatient use of insulin or other injectables before.   Patient was referred and last seen by Primary Care Provider, Dr. Andrey Campanile, on 11/22/2022. At this visit, A1c was 9.0% (up from 7.6%). Last seen on 12/06/2022 by pharmacist.   At last visit, glipizide was discontinued and Lantus 10 units daily started. Libre 2 CGM sent to pharmacy.   Patient arrives in good spirits and presents without any assistance. Reports no issues with insulin. Forgot about Libre 2 CGM and plans to pick up soon.   Family/Social History:  -Fhx: DM -Tobacco: some day smoker  -Alcohol: none since his last visi  Current diabetes medications include: Lantus 10 units Current hypertension medications include: none - lisinopril-hydrochlorothiazide 20-25 mg daily (doctor took off)  Current hyperlipidemia medications include: none  Patient reports adherence to taking all medications as prescribed.   Do you feel that your medications are working for you? yes Have you been experiencing any side effects to the medications prescribed? no Do you have any problems obtaining medications due to transportation or finances? no Insurance coverage: [place] Medicaid  Patient denies hypoglycemic events.  Reported home fasting blood sugars: 160-180s  Patient denies nocturia (nighttime urination) Patient reports neuropathy (nerve pain) in feet and fingers. Has remained the same.  Patient denies visual changes Patient reports self foot exams  Patient reported dietary habits: Eats 1-2 meals/day - Currently eating a lot of fruit and drinking a lot  of water  Patient-reported exercise habits: walking around a lot and at work   O:   Lab Results  Component Value Date   HGBA1C 8.8 (A) 03/02/2023   Vitals:   03/02/23 1616  BP: (!) 139/90  Pulse: 83    Lipid Panel     Component Value Date/Time   CHOL 207 (H) 10/22/2015 1244   TRIG 129 10/22/2015 1244   HDL 49 10/22/2015 1244   CHOLHDL 4.2 10/22/2015 1244   VLDL 26 10/22/2015 1244   LDLCALC 132 (H) 10/22/2015 1244    Clinical Atherosclerotic Cardiovascular Disease (ASCVD): No  The ASCVD Risk score (Arnett DK, et al., 2019) failed to calculate for the following reasons:   Cannot find a previous HDL lab   Cannot find a previous total cholesterol lab   Patient is participating in a Managed Medicaid Plan: No   A/P: Diabetes longstanding, currently uncontrolled based on most recent A1c. Patient is able to verbalize appropriate hypoglycemia management plan. Medication adherence appears good. Control is suboptimal due to need for further diabetes medication management and limited blood glucose readings to gauge patient's range. -Increase Lantus to 20 units daily -F/u on if patient picked up CGM at next appointment -Patient educated on purpose, proper use, and potential adverse effects of Lantus  -Extensively discussed pathophysiology of diabetes, recommended lifestyle interventions, dietary effects on blood sugar control.  -Counseled on s/sx of and management of hypoglycemia.  -Next A1c anticipated 05/2023.   ASCVD risk - primary prevention in patient with diabetes. Last LDL is 132 (in 2017), not at goal of <70 mg/dL. ASCVD risk factors include HTN, DM. Moderate intensity statin indicated.  -  Consider adding statin if CMP is unremarkable and lipid panel remains indicative for medication  Hypertension longstanding, currently controlled minus today's reading. Blood pressure goal of <130/80 mmHg. Blood pressure control is suboptimal due to patient drinking Red Bull prior to  appointment. -Continue with no medication therapy and lifestyle changes   Labs ordered today- CMP, lipid panel, A1c   Written patient instructions provided. Patient verbalized understanding of treatment plan.  Total time in face to face counseling 30 minutes.    Follow-up:  Pharmacist on 04/07/2023 PCP clinic visit on 03/15/2023  Roslyn Smiling, PharmD PGY1 Pharmacy Resident 03/02/2023 4:28 PM

## 2023-03-03 LAB — CMP14+EGFR
ALT: 19 IU/L (ref 0–44)
AST: 21 IU/L (ref 0–40)
Albumin: 4.2 g/dL (ref 3.8–4.9)
Alkaline Phosphatase: 73 IU/L (ref 44–121)
BUN/Creatinine Ratio: 10 (ref 9–20)
BUN: 12 mg/dL (ref 6–24)
Bilirubin Total: 0.3 mg/dL (ref 0.0–1.2)
CO2: 24 mmol/L (ref 20–29)
Calcium: 9.3 mg/dL (ref 8.7–10.2)
Chloride: 103 mmol/L (ref 96–106)
Creatinine, Ser: 1.19 mg/dL (ref 0.76–1.27)
Globulin, Total: 2.9 g/dL (ref 1.5–4.5)
Glucose: 223 mg/dL — ABNORMAL HIGH (ref 70–99)
Potassium: 4.7 mmol/L (ref 3.5–5.2)
Sodium: 139 mmol/L (ref 134–144)
Total Protein: 7.1 g/dL (ref 6.0–8.5)
eGFR: 70 mL/min/{1.73_m2} (ref >59)

## 2023-03-03 LAB — LIPID PANEL
Chol/HDL Ratio: 4.9 ratio (ref 0.0–5.0)
Cholesterol, Total: 194 mg/dL (ref 100–199)
HDL: 40 mg/dL (ref >39)
LDL Chol Calc (NIH): 130 mg/dL — ABNORMAL HIGH (ref 0–99)
Triglycerides: 133 mg/dL (ref 0–149)
VLDL Cholesterol Cal: 24 mg/dL (ref 5–40)

## 2023-03-10 ENCOUNTER — Other Ambulatory Visit: Payer: Self-pay

## 2023-03-15 ENCOUNTER — Ambulatory Visit (INDEPENDENT_AMBULATORY_CARE_PROVIDER_SITE_OTHER): Payer: Medicaid Other | Admitting: Family Medicine

## 2023-03-15 ENCOUNTER — Other Ambulatory Visit: Payer: Self-pay

## 2023-03-15 ENCOUNTER — Encounter: Payer: Self-pay | Admitting: Family Medicine

## 2023-03-15 VITALS — BP 173/100 | HR 73 | Temp 97.8°F | Resp 16 | Ht 69.0 in | Wt 187.4 lb

## 2023-03-15 DIAGNOSIS — F17200 Nicotine dependence, unspecified, uncomplicated: Secondary | ICD-10-CM

## 2023-03-15 DIAGNOSIS — Z794 Long term (current) use of insulin: Secondary | ICD-10-CM | POA: Diagnosis not present

## 2023-03-15 DIAGNOSIS — F17210 Nicotine dependence, cigarettes, uncomplicated: Secondary | ICD-10-CM

## 2023-03-15 DIAGNOSIS — I10 Essential (primary) hypertension: Secondary | ICD-10-CM

## 2023-03-15 DIAGNOSIS — E1140 Type 2 diabetes mellitus with diabetic neuropathy, unspecified: Secondary | ICD-10-CM | POA: Diagnosis not present

## 2023-03-15 MED ORDER — LISINOPRIL 10 MG PO TABS
10.0000 mg | ORAL_TABLET | Freq: Every day | ORAL | 0 refills | Status: DC
  Filled 2023-03-15: qty 90, 90d supply, fill #0

## 2023-03-16 ENCOUNTER — Encounter: Payer: Self-pay | Admitting: Family Medicine

## 2023-03-16 ENCOUNTER — Other Ambulatory Visit: Payer: Self-pay

## 2023-03-16 NOTE — Progress Notes (Signed)
Established Patient Office Visit  Subjective    Patient ID: Chad Wolfe, male    DOB: 02/25/1966  Age: 57 y.o. MRN: [id number]  CC:  Chief Complaint  Patient presents with   Follow-up    HPI Chad Wolfe presents for follow up of chronic med issues. Patient denies acute complaints.   Outpatient Encounter Medications as of 03/15/2023  Medication Sig   ammonium lactate (LAC-HYDRIN) 5 % LOTN lotion Apply 1 Application topically 2 (two) times daily.   Continuous Glucose Receiver (FREESTYLE LIBRE 2 READER) DEVI Use to check blood sugar continuously throughout the day.   Continuous Glucose Sensor (FREESTYLE LIBRE 2 SENSOR) MISC Use to check blood sugar continuously throughout the day.   glucose blood (TRUE METRIX BLOOD GLUCOSE TEST) test strip Use as instructed   insulin glargine (LANTUS SOLOSTAR) 100 UNIT/ML Solostar Pen Inject 20 Units into the skin daily.   Insulin Pen Needle 32G X 4 MM MISC Use to inject Lantus once daily.   lisinopril (ZESTRIL) 10 MG tablet Take 1 tablet (10 mg total) by mouth daily.   pregabalin (LYRICA) 200 MG capsule Take 1 capsule (200 mg total) by mouth in the morning, at noon, and at bedtime.   TRUEplus Lancets 28G MISC use as directed   albuterol (VENTOLIN HFA) 108 (90 Base) MCG/ACT inhaler Inhale 2 puffs into the lungs every 6 (six) hours as needed for wheezing or shortness of breath. (Patient not taking: Reported on 03/15/2023)   clindamycin (CLEOCIN) 300 MG capsule Take 1 capsule (300 mg total) by mouth 3 (three) times daily. (Patient not taking: Reported on 03/15/2023)   furosemide (LASIX) 20 MG tablet Take 1 tablet (20 mg total) by mouth daily as needed. (Patient not taking: Reported on 03/15/2023)   gabapentin (NEURONTIN) 600 MG tablet Take 1 tablet (600 mg total) by mouth 3 (three) times daily.   hydrochlorothiazide (HYDRODIURIL) 25 MG tablet Take 1 tablet (25 mg total) by mouth daily. (Patient not taking: Reported on 03/15/2023)   lisinopril  (ZESTRIL) 20 MG tablet Take 1 tablet (20 mg total) by mouth daily. (Patient not taking: Reported on 03/15/2023)   lisinopril-hydrochlorothiazide (ZESTORETIC) 20-25 MG tablet Take 1 tablet by mouth daily. (Patient not taking: Reported on 03/15/2023)   Omega-3 Fatty Acids (FISH OIL) 1000 MG CAPS Take by mouth. (Patient not taking: Reported on 03/15/2023)   predniSONE (DELTASONE) 50 MG tablet Take 1 tablet (50 mg total) by mouth daily with breakfast. (Patient not taking: Reported on 03/15/2023)   No facility-administered encounter medications on file as of 03/15/2023.    Past Medical History:  Diagnosis Date   Diabetes mellitus type 2 in nonobese (HCC) 01/21/2016   Gastric ulcer    Hepatitis C    Hyperlipidemia    Hypertension    Substance abuse (HCC)     Past Surgical History:  Procedure Laterality Date   I & D EXTREMITY Right 02/26/2017   Procedure: IRRIGATION AND DEBRIDEMENT AND REDUCTION OF RIGHT MIDDLE FINGER;  Surgeon: Dominica Severin, MD;  Location: [facility] OR;  Service: Orthopedics;  Laterality: Right;   STOMACH SURGERY      Family History  Problem Relation Age of Onset   Diabetes Father    Hypertension Father    Diabetes Sister    Hypertension Sister    Diabetes Brother    Hypertension Brother     Social History   Socioeconomic History   Marital status: Divorced    Spouse name: Not on file   Number of  children: Not on file   Years of education: Not on file   Highest education level: Not on file  Occupational History   Not on file  Tobacco Use   Smoking status: Some Days    Types: Cigarettes    Passive exposure: Current   Smokeless tobacco: Never  Substance and Sexual Activity   Alcohol use: Yes    Alcohol/week: 0.0 standard drinks of alcohol    Comment: drank a quart of beer this morning PT DENIES 09/26/15   Drug use: No    Types: Cocaine, Oxycodone    Comment: oxycodone 2 days ago "my sister gave me one"; no cocaine use   Sexual activity: Not Currently    Comment:  opiates  Other Topics Concern   Not on file  Social History Narrative   Not on file   Social Determinants of Health   Financial Resource Strain: Low Risk  (03/15/2023)   Overall Financial Resource Strain (CARDIA)    Difficulty of Paying Living Expenses: Not hard at all  Food Insecurity: Low Risk  (09/09/2022)   [facility]   Hunger Vital Sign    Worried About Running Out of Food in the Last Year: Never true    Ran Out of Food in the Last Year: Never true  Transportation Needs: No Transportation Needs (09/09/2022)   [facility]   Transportation    In the past 12 months, has lack of reliable transportation kept you from medical appointments, meetings, work or from getting things needed for daily living? : No  Physical Activity: Sufficiently Active (03/15/2023)   Exercise Vital Sign    Days of Exercise per Week: 7 days    Minutes of Exercise per Session: 60 min  Stress: No Stress Concern Present (03/15/2023)   [employer] of Occupational Health - Occupational Stress Questionnaire    Feeling of Stress : Not at all  Social Connections: Moderately Isolated (03/15/2023)   Social Connection and Isolation Panel [NHANES]    Frequency of Communication with Friends and Family: More than three times a week    Frequency of Social Gatherings with Friends and Family: More than three times a week    Attends Religious Services: More than 4 times per year    Active Member of [employer] or Organizations: No    Attends Banker Meetings: Never    Marital Status: Divorced  Catering manager Violence: Not on file    Review of Systems  All other systems reviewed and are negative.       Objective    BP (!) 173/100 (BP Location: Left Arm, Patient Position: Sitting, Cuff Size: Normal)   Pulse 73   Temp 97.8 F (36.6 C) (Oral)   Resp 16   Ht 5\' 9"  (1.753 m)   Wt 187 lb 6.4 oz (85 kg)   SpO2 95%   BMI 27.67 kg/m   Physical  Exam Vitals and nursing note reviewed.  Constitutional:      General: He is not in acute distress. Cardiovascular:     Rate and Rhythm: Normal rate and regular rhythm.  Pulmonary:     Effort: Pulmonary effort is normal. No respiratory distress.     Breath sounds: No wheezing.  Abdominal:     Palpations: Abdomen is soft.     Tenderness: There is no abdominal tenderness.  Neurological:     General: No focal deficit present.     Mental Status: He is alert  and oriented to person, place, and time.         Assessment & Plan:   1. Uncontrolled hypertension Elevated readings. Discussed compliance. Will restart lisinopril 10 mg daily  2. Type 2 diabetes mellitus with diabetic neuropathy, without long-term current use of insulin (HCC) Improving A1c. continue  3. Smoking Discussed reduction/cessation    No follow-ups on file.   [name], MD

## 2023-03-23 ENCOUNTER — Other Ambulatory Visit: Payer: Self-pay

## 2023-03-28 ENCOUNTER — Other Ambulatory Visit: Payer: Self-pay

## 2023-03-30 ENCOUNTER — Other Ambulatory Visit: Payer: Self-pay

## 2023-04-07 ENCOUNTER — Ambulatory Visit: Payer: Medicaid Other | Admitting: Pharmacist

## 2023-04-18 ENCOUNTER — Ambulatory Visit: Payer: Medicaid Other | Admitting: Family Medicine

## 2023-04-19 ENCOUNTER — Encounter: Payer: Self-pay | Admitting: Family Medicine

## 2023-04-19 ENCOUNTER — Ambulatory Visit (INDEPENDENT_AMBULATORY_CARE_PROVIDER_SITE_OTHER): Payer: Medicaid Other | Admitting: Family Medicine

## 2023-04-19 ENCOUNTER — Telehealth: Payer: Self-pay | Admitting: Family Medicine

## 2023-04-19 ENCOUNTER — Other Ambulatory Visit: Payer: Self-pay

## 2023-04-19 VITALS — BP 159/115 | HR 72 | Temp 98.1°F | Resp 16 | Ht 69.0 in | Wt 189.2 lb

## 2023-04-19 DIAGNOSIS — F17210 Nicotine dependence, cigarettes, uncomplicated: Secondary | ICD-10-CM

## 2023-04-19 DIAGNOSIS — E1140 Type 2 diabetes mellitus with diabetic neuropathy, unspecified: Secondary | ICD-10-CM

## 2023-04-19 DIAGNOSIS — Z794 Long term (current) use of insulin: Secondary | ICD-10-CM

## 2023-04-19 DIAGNOSIS — I10 Essential (primary) hypertension: Secondary | ICD-10-CM

## 2023-04-19 DIAGNOSIS — E1142 Type 2 diabetes mellitus with diabetic polyneuropathy: Secondary | ICD-10-CM | POA: Diagnosis not present

## 2023-04-19 MED ORDER — AMLODIPINE BESYLATE 10 MG PO TABS
10.0000 mg | ORAL_TABLET | Freq: Every day | ORAL | 0 refills | Status: DC
  Filled 2023-04-19: qty 90, 90d supply, fill #0

## 2023-04-19 MED ORDER — PREGABALIN 200 MG PO CAPS
200.0000 mg | ORAL_CAPSULE | Freq: Three times a day (TID) | ORAL | 2 refills | Status: DC
Start: 2023-04-19 — End: 2023-07-21
  Filled 2023-04-19 – 2023-04-22 (×2): qty 90, 30d supply, fill #0
  Filled 2023-05-19 – 2023-05-20 (×2): qty 90, 30d supply, fill #1
  Filled 2023-06-20: qty 90, 30d supply, fill #0
  Filled 2023-06-20: qty 90, 30d supply, fill #2

## 2023-04-19 NOTE — Telephone Encounter (Signed)
Copied from CRM [id number]. Topic: General - Other >> Apr 19, 2023 12:26 PM Macon Large wrote: Reason for CRM: April with [facility] stated that they need approval to fill Rx for pregabalin (LYRICA) 200 MG capsule early. Cb# [phone]

## 2023-04-20 LAB — MICROALBUMIN / CREATININE URINE RATIO
Creatinine, Urine: 125.2 mg/dL
Microalb/Creat Ratio: <2 mg/g{creat} (ref 0–29)
Microalbumin, Urine: <3 ug/mL

## 2023-04-22 ENCOUNTER — Other Ambulatory Visit: Payer: Self-pay

## 2023-04-25 ENCOUNTER — Encounter: Payer: Self-pay | Admitting: Family Medicine

## 2023-04-25 NOTE — Progress Notes (Signed)
Established Patient Office Visit  Subjective    Patient ID: Chad Wolfe, male    DOB: 1966-05-30  Age: 57 y.o. MRN: [id number]  CC:  Chief Complaint  Patient presents with   Follow-up    B/ p check and medication refill    HPI Chad Wolfe presents for routine follow up of hypertension and diabetes. He reports continued neuropathy symptoms.   Outpatient Encounter Medications as of 04/19/2023  Medication Sig   amLODipine (NORVASC) 10 MG tablet Take 1 tablet (10 mg total) by mouth daily.   Continuous Glucose Receiver (FREESTYLE LIBRE 2 READER) DEVI Use to check blood sugar continuously throughout the day.   Continuous Glucose Sensor (FREESTYLE LIBRE 2 SENSOR) MISC Use to check blood sugar continuously throughout the day.   glucose blood (TRUE METRIX BLOOD GLUCOSE TEST) test strip Use as instructed   insulin glargine (LANTUS SOLOSTAR) 100 UNIT/ML Solostar Pen Inject 20 Units into the skin daily.   Insulin Pen Needle 32G X 4 MM MISC Use to inject Lantus once daily.   lisinopril (ZESTRIL) 10 MG tablet Take 1 tablet (10 mg total) by mouth daily.   TRUEplus Lancets 28G MISC use as directed   [DISCONTINUED] pregabalin (LYRICA) 200 MG capsule Take 1 capsule (200 mg total) by mouth in the morning, at noon, and at bedtime.   albuterol (VENTOLIN HFA) 108 (90 Base) MCG/ACT inhaler Inhale 2 puffs into the lungs every 6 (six) hours as needed for wheezing or shortness of breath. (Patient not taking: Reported on 03/15/2023)   ammonium lactate (LAC-HYDRIN) 5 % LOTN lotion Apply 1 Application topically 2 (two) times daily. (Patient not taking: Reported on 04/19/2023)   clindamycin (CLEOCIN) 300 MG capsule Take 1 capsule (300 mg total) by mouth 3 (three) times daily. (Patient not taking: Reported on 03/15/2023)   furosemide (LASIX) 20 MG tablet Take 1 tablet (20 mg total) by mouth daily as needed. (Patient not taking: Reported on 03/15/2023)   gabapentin (NEURONTIN) 600 MG tablet Take 1 tablet  (600 mg total) by mouth 3 (three) times daily.   hydrochlorothiazide (HYDRODIURIL) 25 MG tablet Take 1 tablet (25 mg total) by mouth daily. (Patient not taking: Reported on 03/15/2023)   lisinopril (ZESTRIL) 20 MG tablet Take 1 tablet (20 mg total) by mouth daily. (Patient not taking: Reported on 03/15/2023)   lisinopril-hydrochlorothiazide (ZESTORETIC) 20-25 MG tablet Take 1 tablet by mouth daily. (Patient not taking: Reported on 03/15/2023)   Omega-3 Fatty Acids (FISH OIL) 1000 MG CAPS Take by mouth. (Patient not taking: Reported on 03/15/2023)   predniSONE (DELTASONE) 50 MG tablet Take 1 tablet (50 mg total) by mouth daily with breakfast. (Patient not taking: Reported on 03/15/2023)   pregabalin (LYRICA) 200 MG capsule Take 1 capsule (200 mg total) by mouth in the morning, at noon, and at bedtime.   No facility-administered encounter medications on file as of 04/19/2023.    Past Medical History:  Diagnosis Date   Diabetes mellitus type 2 in nonobese (HCC) 01/21/2016   Gastric ulcer    Hepatitis C    Hyperlipidemia    Hypertension    Substance abuse (HCC)     Past Surgical History:  Procedure Laterality Date   I & D EXTREMITY Right 02/26/2017   Procedure: IRRIGATION AND DEBRIDEMENT AND REDUCTION OF RIGHT MIDDLE FINGER;  Surgeon: Dominica Severin, MD;  Location: [facility] OR;  Service: Orthopedics;  Laterality: Right;   STOMACH SURGERY      Family History  Problem Relation Age of  Onset   Diabetes Father    Hypertension Father    Diabetes Sister    Hypertension Sister    Diabetes Brother    Hypertension Brother     Social History   Socioeconomic History   Marital status: Divorced    Spouse name: Not on file   Number of children: Not on file   Years of education: Not on file   Highest education level: Not on file  Occupational History   Not on file  Tobacco Use   Smoking status: Some Days    Types: Cigarettes    Passive exposure: Current   Smokeless tobacco: Never  Substance and  Sexual Activity   Alcohol use: Yes    Alcohol/week: 0.0 standard drinks of alcohol    Comment: drank a quart of beer this morning PT DENIES 09/26/15   Drug use: No    Types: Cocaine, Oxycodone    Comment: oxycodone 2 days ago "my sister gave me one"; no cocaine use   Sexual activity: Not Currently    Comment: opiates  Other Topics Concern   Not on file  Social History Narrative   Not on file   Social Determinants of Health   Financial Resource Strain: Low Risk  (03/15/2023)   Overall Financial Resource Strain (CARDIA)    Difficulty of Paying Living Expenses: Not hard at all  Food Insecurity: Low Risk  (09/09/2022)   [facility]   Hunger Vital Sign    Worried About Running Out of Food in the Last Year: Never true    Ran Out of Food in the Last Year: Never true  Transportation Needs: No Transportation Needs (09/09/2022)   [facility]   Transportation    In the past 12 months, has lack of reliable transportation kept you from medical appointments, meetings, work or from getting things needed for daily living? : No  Physical Activity: Sufficiently Active (03/15/2023)   Exercise Vital Sign    Days of Exercise per Week: 7 days    Minutes of Exercise per Session: 60 min  Stress: No Stress Concern Present (03/15/2023)   [employer] of Occupational Health - Occupational Stress Questionnaire    Feeling of Stress : Not at all  Social Connections: Moderately Isolated (03/15/2023)   Social Connection and Isolation Panel [NHANES]    Frequency of Communication with Friends and Family: More than three times a week    Frequency of Social Gatherings with Friends and Family: More than three times a week    Attends Religious Services: More than 4 times per year    Active Member of [employer] or Organizations: No    Attends Banker Meetings: Never    Marital Status: Divorced  Catering manager Violence: Not on file    Review  of Systems  All other systems reviewed and are negative.       Objective    BP (!) 159/115 (BP Location: Left Arm, Patient Position: Sitting, Cuff Size: Normal)   Pulse 72   Temp 98.1 F (36.7 C) (Oral)   Resp 16   Ht 5\' 9"  (1.753 m)   Wt 189 lb 3.2 oz (85.8 kg)   SpO2 96%   BMI 27.94 kg/m   Physical Exam Vitals and nursing note reviewed.  Constitutional:      General: He is not in acute distress. Cardiovascular:     Rate and Rhythm: Normal rate and regular rhythm.  Pulmonary:  Effort: Pulmonary effort is normal. No respiratory distress.     Breath sounds: No wheezing.  Abdominal:     Palpations: Abdomen is soft.     Tenderness: There is no abdominal tenderness.  Neurological:     General: No focal deficit present.     Mental Status: He is alert and oriented to person, place, and time.         Assessment & Plan:   Type 2 diabetes mellitus with diabetic neuropathy, without long-term current use of insulin (HCC) -     Microalbumin / creatinine urine ratio  Uncontrolled hypertension  Diabetic polyneuropathy associated with type 2 diabetes mellitus (HCC) -     Pregabalin; Take 1 capsule (200 mg total) by mouth in the morning, at noon, and at bedtime.  Dispense: 90 capsule; Refill: 2  Encounter for long-term (current) use of insulin (HCC)  Other orders -     amLODIPine Besylate; Take 1 tablet (10 mg total) by mouth daily.  Dispense: 90 tablet; Refill: 0     Return in about 2 weeks (around 05/03/2023) for follow up.   [name], MD

## 2023-05-02 ENCOUNTER — Other Ambulatory Visit: Payer: Self-pay

## 2023-05-11 ENCOUNTER — Other Ambulatory Visit: Payer: Self-pay

## 2023-05-11 ENCOUNTER — Ambulatory Visit: Payer: Medicaid Other | Admitting: Family Medicine

## 2023-05-11 ENCOUNTER — Encounter: Payer: Self-pay | Admitting: Family Medicine

## 2023-05-11 VITALS — BP 131/81 | HR 77 | Temp 98.1°F | Resp 18 | Wt 188.6 lb

## 2023-05-11 DIAGNOSIS — G6289 Other specified polyneuropathies: Secondary | ICD-10-CM | POA: Diagnosis not present

## 2023-05-11 MED ORDER — METHOCARBAMOL 500 MG PO TABS
500.0000 mg | ORAL_TABLET | Freq: Four times a day (QID) | ORAL | 3 refills | Status: DC
  Filled 2023-05-11: qty 60, 15d supply, fill #0

## 2023-05-13 NOTE — Progress Notes (Signed)
Established Patient Office Visit  Subjective    Patient ID: Chad Wolfe, male    DOB: [birth year]  Age: 57 y.o. MRN: [id number]  CC:  Chief Complaint  Patient presents with   Follow-up    HPI Chad Wolfe presents for follow up of chronic pain.   Outpatient Encounter Medications as of 05/11/2023  Medication Sig   amLODipine (NORVASC) 10 MG tablet Take 1 tablet (10 mg total) by mouth daily.   Continuous Glucose Receiver (FREESTYLE LIBRE 2 READER) DEVI Use to check blood sugar continuously throughout the day.   Continuous Glucose Sensor (FREESTYLE LIBRE 2 SENSOR) MISC Use to check blood sugar continuously throughout the day.   glucose blood (TRUE METRIX BLOOD GLUCOSE TEST) test strip Use as instructed   insulin glargine (LANTUS SOLOSTAR) 100 UNIT/ML Solostar Pen Inject 20 Units into the skin daily.   Insulin Pen Needle 32G X 4 MM MISC Use to inject Lantus once daily.   lisinopril (ZESTRIL) 10 MG tablet Take 1 tablet (10 mg total) by mouth daily.   methocarbamol (ROBAXIN) 500 MG tablet Take 1 tablet (500 mg total) by mouth 4 (four) times daily.   pregabalin (LYRICA) 200 MG capsule Take 1 capsule (200 mg total) by mouth in the morning, at noon, and at bedtime.   TRUEplus Lancets 28G MISC use as directed   albuterol (VENTOLIN HFA) 108 (90 Base) MCG/ACT inhaler Inhale 2 puffs into the lungs every 6 (six) hours as needed for wheezing or shortness of breath. (Patient not taking: Reported on 03/15/2023)   ammonium lactate (LAC-HYDRIN) 5 % LOTN lotion Apply 1 Application topically 2 (two) times daily. (Patient not taking: Reported on 04/19/2023)   clindamycin (CLEOCIN) 300 MG capsule Take 1 capsule (300 mg total) by mouth 3 (three) times daily. (Patient not taking: Reported on 03/15/2023)   furosemide (LASIX) 20 MG tablet Take 1 tablet (20 mg total) by mouth daily as needed. (Patient not taking: Reported on 03/15/2023)   gabapentin (NEURONTIN) 600 MG tablet Take 1 tablet (600 mg total) by  mouth 3 (three) times daily.   hydrochlorothiazide (HYDRODIURIL) 25 MG tablet Take 1 tablet (25 mg total) by mouth daily. (Patient not taking: Reported on 03/15/2023)   lisinopril (ZESTRIL) 20 MG tablet Take 1 tablet (20 mg total) by mouth daily. (Patient not taking: Reported on 03/15/2023)   lisinopril-hydrochlorothiazide (ZESTORETIC) 20-25 MG tablet Take 1 tablet by mouth daily. (Patient not taking: Reported on 03/15/2023)   Omega-3 Fatty Acids (FISH OIL) 1000 MG CAPS Take by mouth. (Patient not taking: Reported on 03/15/2023)   predniSONE (DELTASONE) 50 MG tablet Take 1 tablet (50 mg total) by mouth daily with breakfast. (Patient not taking: Reported on 03/15/2023)   No facility-administered encounter medications on file as of 05/11/2023.    Past Medical History:  Diagnosis Date   Diabetes mellitus type 2 in nonobese (HCC) 01/21/2016   Gastric ulcer    Hepatitis C    Hyperlipidemia    Hypertension    Substance abuse (HCC)     Past Surgical History:  Procedure Laterality Date   I & D EXTREMITY Right 02/26/2017   Procedure: IRRIGATION AND DEBRIDEMENT AND REDUCTION OF RIGHT MIDDLE FINGER;  Surgeon: Dominica Severin, MD;  Location: [facility] OR;  Service: Orthopedics;  Laterality: Right;   STOMACH SURGERY      Family History  Problem Relation Age of Onset   Diabetes Father    Hypertension Father    Diabetes Sister    Hypertension Sister  Diabetes Brother    Hypertension Brother     Social History   Socioeconomic History   Marital status: Divorced    Spouse name: Not on file   Number of children: Not on file   Years of education: Not on file   Highest education level: Not on file  Occupational History   Not on file  Tobacco Use   Smoking status: Some Days    Types: Cigarettes    Passive exposure: Current   Smokeless tobacco: Never  Substance and Sexual Activity   Alcohol use: Yes    Alcohol/week: 0.0 standard drinks of alcohol    Comment: drank a quart of beer this morning PT  DENIES 09/26/15   Drug use: No    Types: Cocaine, Oxycodone    Comment: oxycodone 2 days ago "my sister gave me one"; no cocaine use   Sexual activity: Not Currently    Comment: opiates  Other Topics Concern   Not on file  Social History Narrative   Not on file   Social Determinants of Health   Financial Resource Strain: Low Risk  (03/15/2023)   Overall Financial Resource Strain (CARDIA)    Difficulty of Paying Living Expenses: Not hard at all  Food Insecurity: Low Risk  (09/09/2022)   [facility]   Hunger Vital Sign    Worried About Running Out of Food in the Last Year: Never true    Ran Out of Food in the Last Year: Never true  Transportation Needs: No Transportation Needs (09/09/2022)   [facility]   Transportation    In the past 12 months, has lack of reliable transportation kept you from medical appointments, meetings, work or from getting things needed for daily living? : No  Physical Activity: Sufficiently Active (03/15/2023)   Exercise Vital Sign    Days of Exercise per Week: 7 days    Minutes of Exercise per Session: 60 min  Stress: No Stress Concern Present (03/15/2023)   [employer] of Occupational Health - Occupational Stress Questionnaire    Feeling of Stress : Not at all  Social Connections: Moderately Isolated (03/15/2023)   Social Connection and Isolation Panel [NHANES]    Frequency of Communication with Friends and Family: More than three times a week    Frequency of Social Gatherings with Friends and Family: More than three times a week    Attends Religious Services: More than 4 times per year    Active Member of [employer] or Organizations: No    Attends Banker Meetings: Never    Marital Status: Divorced  Catering manager Violence: Not on file    Review of Systems  All other systems reviewed and are negative.       Objective    BP 131/81 (BP Location: Left Arm, Patient Position:  Sitting, Cuff Size: Normal)   Pulse 77   Temp 98.1 F (36.7 C) (Oral)   Resp 18   Wt 188 lb 9.6 oz (85.5 kg)   SpO2 98%   BMI 27.85 kg/m   Physical Exam Vitals and nursing note reviewed.  Constitutional:      General: He is not in acute distress. Cardiovascular:     Rate and Rhythm: Normal rate and regular rhythm.  Pulmonary:     Effort: Pulmonary effort is normal. No respiratory distress.     Breath sounds: No wheezing.  Abdominal:     Palpations: Abdomen is soft.  Tenderness: There is no abdominal tenderness.  Neurological:     General: No focal deficit present.     Mental Status: He is alert and oriented to person, place, and time.         Assessment & Plan:   Other polyneuropathy  Other orders -     Methocarbamol; Take 1 tablet (500 mg total) by mouth 4 (four) times daily.  Dispense: 60 tablet; Refill: 3     Return in about 1 month (around 06/11/2023) for follow up.   [name], MD

## 2023-05-16 ENCOUNTER — Other Ambulatory Visit: Payer: Self-pay

## 2023-05-19 ENCOUNTER — Other Ambulatory Visit: Payer: Self-pay

## 2023-05-20 ENCOUNTER — Other Ambulatory Visit: Payer: Self-pay

## 2023-05-27 ENCOUNTER — Other Ambulatory Visit: Payer: Self-pay | Admitting: Family Medicine

## 2023-05-27 ENCOUNTER — Other Ambulatory Visit (HOSPITAL_BASED_OUTPATIENT_CLINIC_OR_DEPARTMENT_OTHER): Payer: Self-pay

## 2023-05-27 ENCOUNTER — Other Ambulatory Visit: Payer: Self-pay

## 2023-05-27 MED ORDER — LANTUS SOLOSTAR 100 UNIT/ML ~~LOC~~ SOPN
20.0000 [IU] | PEN_INJECTOR | Freq: Every day | SUBCUTANEOUS | 2 refills | Status: DC
  Filled 2023-05-27: qty 6, 30d supply, fill #0
  Filled 2023-06-29: qty 6, 30d supply, fill #1
  Filled 2023-07-28: qty 6, 30d supply, fill #2

## 2023-05-27 NOTE — Telephone Encounter (Signed)
Medication Refill -  Most Recent Primary Care Visit:  Provider: [name]  Department: [facility]  Visit Type: OFFICE VISIT  Date: 05/11/2023  Medication: lisinopril (ZESTRIL) 10 MG tablet   Has the patient contacted their pharmacy? Yes Contacted by automated refill.  Pharmacy says he picked up 03/15/2023  Is this the correct pharmacy for this prescription? Yes If no, delete pharmacy and type the correct one.  This is the patient's preferred pharmacy:  [facility] [address] [place] [address] Phone: [phone] Fax: [phone]  Has the prescription been filled recently? No  Is the patient out of the medication? No  about 4 pills left  Has the patient been seen for an appointment in the last year OR does the patient have an upcoming appointment? Yes  Can we respond through MyChart? No  Agent: Please be advised that Rx refills may take up to 3 business days. We ask that you follow-up with your pharmacy.

## 2023-05-30 ENCOUNTER — Other Ambulatory Visit: Payer: Self-pay | Admitting: Family Medicine

## 2023-05-30 ENCOUNTER — Other Ambulatory Visit: Payer: Self-pay

## 2023-05-30 MED ORDER — LISINOPRIL 10 MG PO TABS
10.0000 mg | ORAL_TABLET | Freq: Every day | ORAL | 0 refills | Status: DC
  Filled 2023-05-30: qty 90, 90d supply, fill #0

## 2023-05-30 MED ORDER — TECHLITE PLUS PEN NEEDLES 32G X 4 MM MISC
1 refills | Status: DC
  Filled 2023-05-30: qty 100, 90d supply, fill #0
  Filled 2023-08-23: qty 100, 90d supply, fill #1

## 2023-05-30 NOTE — Telephone Encounter (Signed)
Requested Prescriptions  Pending Prescriptions Disp Refills   lisinopril (ZESTRIL) 10 MG tablet 90 tablet 0    Sig: Take 1 tablet (10 mg total) by mouth daily.     Cardiovascular:  ACE Inhibitors Passed - 05/30/2023  7:54 AM      Passed - Cr in normal range and within 180 days    Creat  Date Value Ref Range Status  09/02/2014 0.90 0.50 - 1.35 mg/dL Final   Creatinine, Ser  Date Value Ref Range Status  03/02/2023 1.19 0.76 - 1.27 mg/dL Final         Passed - K in normal range and within 180 days    Potassium  Date Value Ref Range Status  03/02/2023 4.7 3.5 - 5.2 mmol/L Final         Passed - Patient is not pregnant      Passed - Last BP in normal range    BP Readings from Last 1 Encounters:  05/11/23 131/81         Passed - Valid encounter within last 6 months    Recent Outpatient Visits           2 weeks ago Other polyneuropathy   [facility], Lauris Poag, MD   1 month ago Type 2 diabetes mellitus with diabetic neuropathy, without long-term current use of insulin (HCC)   [facility], MD   2 months ago Uncontrolled hypertension   [facility], Lauris Poag, MD   2 months ago Diabetes mellitus type 2 in nonobese [facility])   [facility] [place] Lois Huxley, [place] L, RPH-CPP   5 months ago Diabetes mellitus type 2 in nonobese [place])   [facility] [place] Drucilla Chalet, RPH-CPP       Future Appointments             In 2 weeks [name], MD [facility]

## 2023-05-31 ENCOUNTER — Other Ambulatory Visit: Payer: Self-pay

## 2023-06-10 ENCOUNTER — Other Ambulatory Visit: Payer: Self-pay

## 2023-06-10 ENCOUNTER — Emergency Department (HOSPITAL_COMMUNITY)
Admission: EM | Admit: 2023-06-10 | Discharge: 2023-06-10 | Disposition: A | Discharge status: Home / Self Care | Payer: Medicaid Other | Attending: Emergency Medicine | Admitting: Emergency Medicine

## 2023-06-10 ENCOUNTER — Encounter (HOSPITAL_COMMUNITY): Payer: Self-pay

## 2023-06-10 DIAGNOSIS — H1013 Acute atopic conjunctivitis, bilateral: Secondary | ICD-10-CM | POA: Insufficient documentation

## 2023-06-10 DIAGNOSIS — E119 Type 2 diabetes mellitus without complications: Secondary | ICD-10-CM | POA: Diagnosis not present

## 2023-06-10 DIAGNOSIS — Z794 Long term (current) use of insulin: Secondary | ICD-10-CM | POA: Diagnosis not present

## 2023-06-10 DIAGNOSIS — H5789 Other specified disorders of eye and adnexa: Secondary | ICD-10-CM | POA: Diagnosis present

## 2023-06-10 MED ORDER — LORATADINE 10 MG PO TABS
10.0000 mg | ORAL_TABLET | Freq: Every day | ORAL | 0 refills | Status: DC
  Filled 2023-06-10: qty 30, 30d supply, fill #0

## 2023-06-10 MED ORDER — TETRACAINE HCL 0.5 % OP SOLN
2.0000 [drp] | Freq: Once | OPHTHALMIC | Status: AC
  Administered 2023-06-10: 2 [drp] via OPHTHALMIC
  Filled 2023-06-10: qty 4

## 2023-06-10 MED ORDER — FLUORESCEIN SODIUM 1 MG OP STRP
1.0000 | ORAL_STRIP | Freq: Once | OPHTHALMIC | Status: AC
  Administered 2023-06-10: 1 via OPHTHALMIC
  Filled 2023-06-10: qty 1

## 2023-06-10 NOTE — ED Provider Notes (Signed)
 [place] EMERGENCY DEPARTMENT AT [facility] Provider Note   CSN: [id number] Arrival date & time: 06/10/23  9050     History  Chief Complaint  Patient presents with   Conjunctivitis    Chad Wolfe is a 58 y.o. male history of type 2 diabetes, gastric ulcer, hep C, substance abuse presented with bilateral eye redness and watery discharge for the past week.  Patient states he tried a new cologne and wiped it around his eyes and since has had redness with itching eyes.  Patient denies vision changes, eye pain or trauma.  Patient has tried antihistamine eyedrops but does not take oral antihistamines.  Patient denies eye swelling or surrounding redness.  Patient has fevers.  Patient denies any thick purulent discharge or green discharge as he states he is only having watery discharge.  Patient wears glasses but does not wear contacts.  Patient denies photophobia or pain in dark rooms.  Patient denies foreign body sensation.    Home Medications Prior to Admission medications   Medication Sig Start Date End Date Taking? Authorizing Provider  loratadine  (CLARITIN ) 10 MG tablet Take 1 tablet (10 mg total) by mouth daily. 06/10/23  Yes Chiann Goffredo, Lynwood DASEN, PA-C  albuterol  (VENTOLIN  HFA) 108 (90 Base) MCG/ACT inhaler Inhale 2 puffs into the lungs every 6 (six) hours as needed for wheezing or shortness of breath. Patient not taking: Reported on 03/15/2023 05/04/22   Tanda Bleacher, MD  amLODipine  (NORVASC ) 10 MG tablet Take 1 tablet (10 mg total) by mouth daily. 04/19/23   Tanda Bleacher, MD  ammonium lactate  (LAC-HYDRIN ) 5 % LOTN lotion Apply 1 Application topically 2 (two) times daily. Patient not taking: Reported on 04/19/2023 11/19/22   Tobie Franky SQUIBB, DPM  clindamycin  (CLEOCIN ) 300 MG capsule Take 1 capsule (300 mg total) by mouth 3 (three) times daily. Patient not taking: Reported on 03/15/2023 09/06/22   Logan Ubaldo NOVAK, PA-C  Continuous Glucose Receiver (FREESTYLE LIBRE 2 READER)  DEVI Use to check blood sugar continuously throughout the day. 12/06/22   Newlin, Enobong, MD  Continuous Glucose Sensor (FREESTYLE LIBRE 2 SENSOR) MISC Use to check blood sugar continuously throughout the day. 12/06/22   Newlin, Enobong, MD  furosemide  (LASIX ) 20 MG tablet Take 1 tablet (20 mg total) by mouth daily as needed. Patient not taking: Reported on 03/15/2023 11/10/22   Lorren Greig PARAS, NP  gabapentin  (NEURONTIN ) 600 MG tablet Take 1 tablet (600 mg total) by mouth 3 (three) times daily. 05/04/22 01/05/23  Tanda Bleacher, MD  glucose blood (TRUE METRIX BLOOD GLUCOSE TEST) test strip Use as instructed 01/15/21   [name], [name], PA-C  hydrochlorothiazide  (HYDRODIURIL ) 25 MG tablet Take 1 tablet (25 mg total) by mouth daily. Patient not taking: Reported on 03/15/2023 08/02/22   Tanda Bleacher, MD  insulin  glargine (LANTUS  SOLOSTAR) 100 UNIT/ML Solostar Pen Inject 20 Units into the skin daily. 05/27/23   Newlin, Enobong, MD  Insulin  Pen Needle (TECHLITE PLUS PEN NEEDLES) 32G X 4 MM MISC Use to inject Lantus  once daily. 05/30/23   Newlin, Enobong, MD  lisinopril  (ZESTRIL ) 10 MG tablet Take 1 tablet (10 mg total) by mouth daily. 05/30/23   Tanda Bleacher, MD  lisinopril  (ZESTRIL ) 20 MG tablet Take 1 tablet (20 mg total) by mouth daily. Patient not taking: Reported on 03/15/2023 08/02/22   Tanda Bleacher, MD  lisinopril -hydrochlorothiazide  (ZESTORETIC ) 20-25 MG tablet Take 1 tablet by mouth daily. Patient not taking: Reported on 03/15/2023 08/13/22   Tanda Bleacher, MD  methocarbamol  (ROBAXIN ) 500 MG tablet Take 1 tablet (500 mg total) by mouth 4 (four) times daily. 05/11/23   Tanda Bleacher, MD  Omega-3 Fatty Acids (FISH OIL) 1000 MG CAPS Take by mouth. Patient not taking: Reported on 03/15/2023    [provider]  predniSONE  (DELTASONE ) 50 MG tablet Take 1 tablet (50 mg total) by mouth daily with breakfast. Patient not taking: Reported on 03/15/2023 05/04/22   Tanda Bleacher, MD  pregabalin  (LYRICA )  200 MG capsule Take 1 capsule (200 mg total) by mouth in the morning, at noon, and at bedtime. 04/19/23 07/18/23  Tanda Bleacher, MD  TRUEplus Lancets 28G MISC use as directed 01/15/21   [name], Kirk RAMAN, PA-C      Allergies    Ibuprofen , Aspirin, Other, and Tylenol  [acetaminophen ]    Review of Systems   Review of Systems  Physical Exam Updated Vital Signs BP 110/82 (BP Location: Left Arm)   Pulse 97   Temp 97.8 F (36.6 C)   Resp 18   Ht 5' 9 (1.753 m)   Wt 85.3 kg   BMI 27.76 kg/m  Physical Exam Constitutional:      General: He is not in acute distress. Eyes:     General: Lids are normal. Lids are everted, no foreign bodies appreciated. Vision grossly intact. Gaze aligned appropriately. No visual field deficit.       Right eye: Discharge present. No foreign body or hordeolum.        Left eye: Discharge present.No foreign body or hordeolum.     Extraocular Movements: Extraocular movements intact.     Conjunctiva/sclera:     Right eye: Right conjunctiva is injected.     Left eye: Left conjunctiva is injected.     Pupils: Pupils are equal, round, and reactive to light.     Right eye: No corneal abrasion or fluorescein  uptake. Seidel exam negative.     Left eye: No corneal abrasion or fluorescein  uptake. Seidel exam negative.    Comments: No erythema or edema noted around the eyes No periorbital ecchymosis noted Bilateral watery discharge Vision grossly intact  Neurological:     Mental Status: He is alert.     ED Results / Procedures / Treatments   Labs (all labs ordered are listed, but only abnormal results are displayed) Labs Reviewed - No data to display  EKG None  Radiology No results found.  Procedures Procedures    Medications Ordered in ED Medications  tetracaine  (PONTOCAINE) 0.5 % ophthalmic solution 2 drop (2 drops Both Eyes Given 06/10/23 1704)  fluorescein  ophthalmic strip 1 strip (1 strip Both Eyes Given 06/10/23 1705)    ED Course/ Medical Decision  Making/ A&P                                 Medical Decision Making Risk Prescription drug management.   Chad Wolfe 58 y.o. presented today for eye pain.  Working DDx that I considered at this time includes, but not limited to, preorbital/orbital cellulitis, acute glaucoma, HSV infection, open globe, conjunctivitis, hordeolum/chalazion, FB, CRAO/CRVO.  R/o DDx: preorbital/orbital cellulitis, acute glaucoma, HSV infection, open globe,  hordeolum/chalazion, FB, CRAO/CRVO: These are considered less likely due to history of present illness, physical exam, lab/imaging findings.  Review of prior external notes: 05/11/2023 office visit  Unique Tests and My Interpretation:  Visual Acuity: Bilateral 10/25, right near 10/25, left near blurry; patient states that his left eye  is persistently worse than his right hence the glasses Fluoroscein Stain: No fluorescein  stain reuptake  Social Determinants of Health: EtOH/Substance Abuse  Discussion with Independent Historian: None  Discussion of Management of Tests: None  Risk: Low: based on diagnostic testing/clinical impression and treatment plan  Risk Stratification Score: None  Plan: On exam patient was in no acute distress stable vitals.  Patient did have bilateral conjunctivitis on exam with watery discharge without features including purulent discharge, green discharge, surrounding erythema, warmth, fluctuance, edema.  Patient's pupils are PERRL bilaterally and exam was ultimately benign.  Will stain eyes and obtain visual acuity to rule out other pathology but do suspect this is allergic conjunctivitis in nature and will encourage him to use cold presses and continue using his antihistamine eyedrops along with taking oral antihistamine such as Claritin  or Zyrtec during the day and Benadryl  at night.  Visual acuity was ultimately reassuring along with fluorescein  stain as there is no reuptake.  Patient did have blurriness on his left  near vision but states that that is chronic and hence why he wears glasses and so do not feel this is acute requiring further workup.  Patient did have grossly intact visual acuity on exam as well which is reassuring.  Patient was in a dark room and did not have any eye pain and was not sensitive to light on exam either.  With a history of his eyes being irritated after trying a new cologne that he put on his face do think this is allergic conjunctivitis and itching will educate patient on the plan listed above.  Patient verbalized understanding acceptance of this plan.  Patient was given return precautions. Patient stable for discharge at this time.  Patient verbalized understanding of plan.  This chart was dictated using voice recognition software.  Despite best efforts to proofread,  errors can occur which can change the documentation meaning.         Final Clinical Impression(s) / ED Diagnoses Final diagnoses:  Allergic conjunctivitis of both eyes    Rx / DC Orders ED Discharge Orders          Ordered    loratadine  (CLARITIN ) 10 MG tablet  Daily        06/10/23 1709              Victor Lynwood ONEIDA DEVONNA 06/10/23 1713    Yolande Lamar BROCKS, MD 06/12/23 [phone]

## 2023-06-10 NOTE — ED Provider Triage Note (Signed)
 Emergency Medicine Provider Triage Evaluation Note  Chad Wolfe , a 58 y.o. male  was evaluated in triage.  Pt complains of lateral itchy dry draining eyes.  Review of Systems  Positive: Itchy eyes Negative: Eye pain, purulent discharge, change in vision, trauma  Physical Exam  BP 110/82 (BP Location: Left Arm)   Pulse 97   Temp 97.8 F (36.6 C)   Resp 18   Ht 5' 9 (1.753 m)   Wt 85.3 kg   BMI 27.76 kg/m  Gen:   Awake, no distress   Resp:  Normal effort  MSK:   Moves extremities without difficulty  Other:  Pulls equal and reactive  Medical Decision Making  Medically screening exam initiated at 11:05 AM.  Appropriate orders placed.  Chad Wolfe was informed that the remainder of the evaluation will be completed by another provider, this initial triage assessment does not replace that evaluation, and the importance of remaining in the ED until their evaluation is complete.  Reporting to emergency room with 1 week of itchy dry eyes.  Appears to have allergic conjunctivitis and has been treated as such with primary care.  Has tried over-the-counter medications and not improving.  Patient is requesting eye exam.  Offered Benadryl  while awaiting eye exam however patient declined.   Chad Warren SAILOR, PA-C 06/10/23 1106

## 2023-06-10 NOTE — Discharge Instructions (Signed)
 Please follow-up with your primary care provider in regards to recent symptoms and ER visit.  Today your exam shows that he have an allergic conjunctivitis causing your eye pain.  Please use the antihistamine drops you are using along with oral antihistamine such as Claritin  or Zyrtec.  You may use cool compresses on your eyes for 15 minutes a day 4 times a day.  If symptoms change or worsen please return to the ER.

## 2023-06-10 NOTE — ED Triage Notes (Signed)
 Reports itchy eyes, drainage and burning x 1 week.  Denies pain just feels itchy and dry.

## 2023-06-13 ENCOUNTER — Other Ambulatory Visit: Payer: Self-pay

## 2023-06-16 ENCOUNTER — Ambulatory Visit (INDEPENDENT_AMBULATORY_CARE_PROVIDER_SITE_OTHER): Payer: Medicaid Other | Admitting: Family Medicine

## 2023-06-16 ENCOUNTER — Encounter: Payer: Self-pay | Admitting: Family Medicine

## 2023-06-16 VITALS — BP 110/83 | HR 94 | Temp 97.7°F | Resp 16 | Wt 188.0 lb

## 2023-06-16 DIAGNOSIS — E1142 Type 2 diabetes mellitus with diabetic polyneuropathy: Secondary | ICD-10-CM

## 2023-06-16 DIAGNOSIS — E1140 Type 2 diabetes mellitus with diabetic neuropathy, unspecified: Secondary | ICD-10-CM | POA: Diagnosis not present

## 2023-06-16 DIAGNOSIS — Z794 Long term (current) use of insulin: Secondary | ICD-10-CM

## 2023-06-16 DIAGNOSIS — I10 Essential (primary) hypertension: Secondary | ICD-10-CM

## 2023-06-16 LAB — POCT GLYCOSYLATED HEMOGLOBIN (HGB A1C): Hemoglobin A1C: 8.1 % — AB (ref 4.0–5.6)

## 2023-06-16 NOTE — Progress Notes (Signed)
 Established Patient Office Visit  Subjective    Patient ID: Chad Wolfe, male    DOB: Feb 08, 1966  Age: 58 y.o. MRN: [id number]  CC:  Chief Complaint  Patient presents with   Medical Management of Chronic Issues    HPI Chad Wolfe presents for follow up of diabetes and hypertension. He reports taking meds as recommended and denies acute complaints.   Outpatient Encounter Medications as of 06/16/2023  Medication Sig   albuterol  (VENTOLIN  HFA) 108 (90 Base) MCG/ACT inhaler Inhale 2 puffs into the lungs every 6 (six) hours as needed for wheezing or shortness of breath. (Patient not taking: Reported on 06/16/2023)   amLODipine  (NORVASC ) 10 MG tablet Take 1 tablet (10 mg total) by mouth daily.   ammonium lactate  (LAC-HYDRIN ) 5 % LOTN lotion Apply 1 Application topically 2 (two) times daily. (Patient not taking: Reported on 06/16/2023)   Continuous Glucose Receiver (FREESTYLE LIBRE 2 READER) DEVI Use to check blood sugar continuously throughout the day.   Continuous Glucose Sensor (FREESTYLE LIBRE 2 SENSOR) MISC Use to check blood sugar continuously throughout the day.   furosemide  (LASIX ) 20 MG tablet Take 1 tablet (20 mg total) by mouth daily as needed. (Patient not taking: Reported on 06/16/2023)   gabapentin  (NEURONTIN ) 600 MG tablet Take 1 tablet (600 mg total) by mouth 3 (three) times daily.   glucose blood (TRUE METRIX BLOOD GLUCOSE TEST) test strip Use as instructed   hydrochlorothiazide  (HYDRODIURIL ) 25 MG tablet Take 1 tablet (25 mg total) by mouth daily. (Patient not taking: Reported on 03/15/2023)   insulin  glargine (LANTUS  SOLOSTAR) 100 UNIT/ML Solostar Pen Inject 20 Units into the skin daily.   Insulin  Pen Needle (TECHLITE PLUS PEN NEEDLES) 32G X 4 MM MISC Use to inject Lantus  once daily.   lisinopril  (ZESTRIL ) 10 MG tablet Take 1 tablet (10 mg total) by mouth daily.   lisinopril  (ZESTRIL ) 20 MG tablet Take 1 tablet (20 mg total) by mouth daily. (Patient not taking: Reported  on 03/15/2023)   lisinopril -hydrochlorothiazide  (ZESTORETIC ) 20-25 MG tablet Take 1 tablet by mouth daily. (Patient not taking: Reported on 03/15/2023)   loratadine  (CLARITIN ) 10 MG tablet Take 1 tablet (10 mg total) by mouth daily.   methocarbamol  (ROBAXIN ) 500 MG tablet Take 1 tablet (500 mg total) by mouth 4 (four) times daily.   Omega-3 Fatty Acids (FISH OIL) 1000 MG CAPS Take by mouth. (Patient not taking: Reported on 03/15/2023)   predniSONE  (DELTASONE ) 50 MG tablet Take 1 tablet (50 mg total) by mouth daily with breakfast. (Patient not taking: Reported on 03/15/2023)   pregabalin  (LYRICA ) 200 MG capsule Take 1 capsule (200 mg total) by mouth in the morning, at noon, and at bedtime.   TRUEplus Lancets 28G MISC use as directed   [DISCONTINUED] clindamycin  (CLEOCIN ) 300 MG capsule Take 1 capsule (300 mg total) by mouth 3 (three) times daily. (Patient not taking: Reported on 06/16/2023)   No facility-administered encounter medications on file as of 06/16/2023.    Past Medical History:  Diagnosis Date   Diabetes mellitus type 2 in nonobese (HCC) 01/21/2016   Gastric ulcer    Hepatitis C    Hyperlipidemia    Hypertension    Substance abuse (HCC)     Past Surgical History:  Procedure Laterality Date   I & D EXTREMITY Right 02/26/2017   Procedure: IRRIGATION AND DEBRIDEMENT AND REDUCTION OF RIGHT MIDDLE FINGER;  Surgeon: Camella Elsie, MD;  Location: [facility] OR;  Service: Orthopedics;  Laterality: Right;  STOMACH SURGERY      Family History  Problem Relation Age of Onset   Diabetes Father    Hypertension Father    Diabetes Sister    Hypertension Sister    Diabetes Brother    Hypertension Brother     Social History   Socioeconomic History   Marital status: Divorced    Spouse name: Not on file   Number of children: Not on file   Years of education: Not on file   Highest education level: Not on file  Occupational History   Not on file  Tobacco Use   Smoking status: Some Days     Types: Cigarettes    Passive exposure: Current   Smokeless tobacco: Never  Substance and Sexual Activity   Alcohol use: Yes    Alcohol/week: 0.0 standard drinks of alcohol    Comment: drank a quart of beer this morning PT DENIES 09/26/15   Drug use: No    Types: Cocaine, Oxycodone     Comment: oxycodone  2 days ago my sister gave me one; no cocaine use   Sexual activity: Not Currently    Comment: opiates  Other Topics Concern   Not on file  Social History Narrative   Not on file   Social Drivers of Health   Financial Resource Strain: Low Risk  (03/15/2023)   Overall Financial Resource Strain (CARDIA)    Difficulty of Paying Living Expenses: Not hard at all  Food Insecurity: Low Risk  (09/09/2022)   [facility]   Hunger Vital Sign    Worried About Running Out of Food in the Last Year: Never true    Ran Out of Food in the Last Year: Never true  Transportation Needs: No Transportation Needs (09/09/2022)   [facility]   Transportation    In the past 12 months, has lack of reliable transportation kept you from medical appointments, meetings, work or from getting things needed for daily living? : No  Physical Activity: Sufficiently Active (03/15/2023)   Exercise Vital Sign    Days of Exercise per Week: 7 days    Minutes of Exercise per Session: 60 min  Stress: No Stress Concern Present (03/15/2023)   [employer] of Occupational Health - Occupational Stress Questionnaire    Feeling of Stress : Not at all  Social Connections: Moderately Isolated (03/15/2023)   Social Connection and Isolation Panel [NHANES]    Frequency of Communication with Friends and Family: More than three times a week    Frequency of Social Gatherings with Friends and Family: More than three times a week    Attends Religious Services: More than 4 times per year    Active Member of [employer] or Organizations: No    Attends Banker Meetings:  Never    Marital Status: Divorced  Catering Manager Violence: Not on file    Review of Systems  All other systems reviewed and are negative.       Objective    BP 110/83   Pulse 94   Temp 97.7 F (36.5 C) (Oral)   Resp 16   Wt 188 lb (85.3 kg)   SpO2 93%   BMI 27.76 kg/m   Physical Exam Vitals and nursing note reviewed.  Constitutional:      General: He is not in acute distress. Cardiovascular:     Rate and Rhythm: Normal rate and regular rhythm.  Pulmonary:     Effort: Pulmonary effort is normal.  Abdominal:     General: There is no distension.     Palpations: Abdomen is soft.  Neurological:     General: No focal deficit present.     Mental Status: He is alert and oriented to person, place, and time.         Assessment & Plan:   1. Type 2 diabetes mellitus with diabetic neuropathy, without long-term current use of insulin  (HCC) (Primary) Improved A1c but still above goal. continue - POCT glycosylated hemoglobin (Hb A1C)  2. Essential hypertension Continue   3. Encounter for long-term (current) use of insulin  (HCC)   4. Diabetic polyneuropathy associated with type 2 diabetes mellitus (HCC)      Return in about 3 months (around 09/14/2023) for follow up.   Tanda Raguel SQUIBB, MD

## 2023-06-20 ENCOUNTER — Other Ambulatory Visit (HOSPITAL_COMMUNITY): Payer: Self-pay

## 2023-06-20 ENCOUNTER — Other Ambulatory Visit: Payer: Self-pay

## 2023-06-29 ENCOUNTER — Other Ambulatory Visit: Payer: Self-pay

## 2023-06-30 ENCOUNTER — Other Ambulatory Visit: Payer: Self-pay

## 2023-07-01 ENCOUNTER — Ambulatory Visit: Payer: Self-pay | Admitting: Family Medicine

## 2023-07-01 NOTE — Telephone Encounter (Signed)
Pt has been scheduled for UC tomorrow at 11am

## 2023-07-01 NOTE — Telephone Encounter (Signed)
  Chief Complaint: bilateral eye drainage and itching Symptoms: red, swollen eyelids, white discharge from eyes, bilateral redness and itching of eyes Frequency: x 3 weeks Pertinent Negatives: Patient denies fever Disposition: [] ED /[] Urgent Care (no appt availability in office) / [x] Appointment(In office/virtual)/ []  [facility]/ [] Home Care/ [] Refused Recommended Disposition /[] [facility]/ []  Follow-up with PCP Additional Notes: Offered patient 84 appointment this morning with his PCP, patient states he takes the city bus and would not be able to get there til 1015. Patient refused offer to be seen at other [facility] primary cares in his area and states he wants to be seen by his PCP. Patient requesting a call back from office.   Reason for Disposition  Blurred vision  Answer Assessment - Initial Assessment Questions 1. EYE DISCHARGE: "Is the discharge in one or both eyes?" "What color is it?" "How much is there?" "When did the discharge start?"      Both eyes; white colored discharge x 3 weeks.  2. REDNESS OF SCLERA: "Is the redness in one or both eyes?" "When did the redness start?"      Yes to both eyes x 3 weeks.  3. EYELIDS: "Are the eyelids red or swollen?" If Yes, ask: "How much?"      Both eyelids red and swollen.  4. VISION: "Is there any difficulty seeing clearly?"      Blurry when he first wakes up.  5. PAIN: "Is there any pain? If Yes, ask: "How bad is it?" (Scale 1-10; or mild, moderate, severe)    - MILD (1-3): doesn't interfere with normal activities     - MODERATE (4-7): interferes with normal activities or awakens from sleep    - SEVERE (8-10): excruciating pain, unable to do any normal activities       Denies.  6. CONTACT LENS: "Do you wear contacts?"     Denies. He states he is supposed to wear glasses but lost them.  7. OTHER SYMPTOMS: "Do you have any other symptoms?" (e.g., fever, runny nose, cough)     Eyes itchy, patient states  he is getting over a chest cold with cough and nasal drainage x 3 weeks  Protocols used: Eye - Pus or Discharge-A-AH

## 2023-07-02 ENCOUNTER — Other Ambulatory Visit: Payer: Self-pay

## 2023-07-02 ENCOUNTER — Other Ambulatory Visit (HOSPITAL_COMMUNITY): Payer: Self-pay

## 2023-07-02 ENCOUNTER — Ambulatory Visit
Admission: RE | Admit: 2023-07-02 | Discharge: 2023-07-02 | Disposition: A | Discharge status: Home / Self Care | Payer: Medicaid Other | Source: Ambulatory Visit | Attending: Family Medicine | Admitting: Family Medicine

## 2023-07-02 VITALS — BP 120/64 | HR 96 | Temp 98.2°F | Resp 18

## 2023-07-02 DIAGNOSIS — E1165 Type 2 diabetes mellitus with hyperglycemia: Secondary | ICD-10-CM | POA: Diagnosis not present

## 2023-07-02 DIAGNOSIS — Z794 Long term (current) use of insulin: Secondary | ICD-10-CM

## 2023-07-02 DIAGNOSIS — H109 Unspecified conjunctivitis: Secondary | ICD-10-CM

## 2023-07-02 DIAGNOSIS — B9689 Other specified bacterial agents as the cause of diseases classified elsewhere: Secondary | ICD-10-CM | POA: Diagnosis not present

## 2023-07-02 LAB — POCT FASTING CBG KUC MANUAL ENTRY: POCT Glucose (KUC): 340 mg/dL — AB (ref 70–99)

## 2023-07-02 MED ORDER — MOXIFLOXACIN HCL 0.5 % OP SOLN
1.0000 [drp] | Freq: Three times a day (TID) | OPHTHALMIC | 0 refills | Status: DC
  Filled 2023-07-02 – 2023-07-06 (×2): qty 3, 20d supply, fill #0

## 2023-07-02 MED ORDER — DOXYCYCLINE HYCLATE 100 MG PO CAPS
100.0000 mg | ORAL_CAPSULE | Freq: Two times a day (BID) | ORAL | 0 refills | Status: DC
  Filled 2023-07-02 (×2): qty 20, 10d supply, fill #0

## 2023-07-02 NOTE — ED Notes (Signed)
Pt's pharmacy is closed today. Pt states he can't go anywhere else bc they "don't take my insurance". Spoke with [facility] outpt and they can fill his doxycycline. He states he will pick that up today and get the eye drops on Monday

## 2023-07-02 NOTE — ED Triage Notes (Addendum)
Pt reports both eyes itchy and swollen for other a month. He went to the ED on Jan 3 and was given medicine that did not help. Drainage. Light sensitivity. Skin around eyes irritated. States symptoms started when he started new BP meds. Family member voices concern for infection.

## 2023-07-02 NOTE — ED Provider Notes (Signed)
[facility]   [id number] 07/02/23 Arrival Time: 1030  ASSESSMENT & PLAN:  1. Bacterial conjunctivitis of both eyes   2. Type 2 diabetes mellitus with hyperglycemia, with long-term current use of insulin (HCC)    Cannot r/o early pre-septal cellulitis. Begin: Meds ordered this encounter  Medications   moxifloxacin (VIGAMOX) 0.5 % ophthalmic solution    Sig: Place 1 drop into both eyes 3 (three) times daily for 7 days.    Dispense:  3 mL    Refill:  0   doxycycline (VIBRAMYCIN) 100 MG capsule    Sig: Take 1 capsule (100 mg total) by mouth 2 (two) times daily.    Dispense:  20 capsule    Refill:  0   Discussed the diagnosis and proper care of conjunctivitis.  Stressed household Presenter, broadcasting. Ophthalmic drops per orders. Antibiotics per orders. Warm compress to eye(s). Local eye care discussed. Analgesics as needed.   Follow-up Information     [facility]).   Specialty: Urgent Care Why: If worsening or failing to improve as anticipated. Contact information: [address] [place] [address] [phone]        Schedule an appointment as soon as possible for a visit  with [name], MD.   Specialty: Family Medicine Why: To discuss your elevated blood sugars. Contact information: [address] [employer] [address] [place] [address] [phone]                Agrees to ED evaluation should he get worse or develop fever.  Reviewed expectations re: course of current medical issues. Questions answered. Outlined signs and symptoms indicating need for more acute intervention. Patient verbalized understanding. After Visit Summary given.   SUBJECTIVE:  Chad Wolfe is a 57 y.o. male with h/o DM, HTN who presents with complaint of bilateral eye irritation/itching; some swelling and skin irritation around eyes; seen in ED approx 3 weeks ago; dx allergic conjunctivitis; Rx Claritin  of which he as taken some without much relief. Denies eye trauma. Does not wear contact lenses. Thick eye drainage reported. Denies specific pain of eyes. Occas blurry vision.  OBJECTIVE:  Vitals:   07/02/23 1116  BP: 120/64  Pulse: 96  Resp: 18  Temp: 98.2 F (36.8 C)  TempSrc: Oral  SpO2: 95%    General appearance: alert; no distress HEENT: [place]; AT; PERRLA; no restriction of the extraocular movements OU: without reported pain; with 2+ conjunctival injection; with watery and thick yellow drainage; without limbal flush; with periorbital swelling and mild erythema around eyes Neck: supple without LAD Lungs: clear to auscultation bilaterally; unlabored respirations Skin: warm and dry Psychological: alert and cooperative; normal mood and affect  Allergies  Allergen Reactions   Ibuprofen Other (See Comments)    Stomach upset   Aspirin Other (See Comments)   Other Other (See Comments)    MD told him not to take [facility] due to Hep C   Tylenol [Acetaminophen] Other (See Comments)    HEP C    Past Medical History:  Diagnosis Date   Diabetes mellitus type 2 in nonobese (HCC) 01/21/2016   Gastric ulcer    Hepatitis C    Hyperlipidemia    Hypertension    Substance abuse (HCC)    Social History   Socioeconomic History   Marital status: Divorced    Spouse name: Not on file   Number of children: Not on file   Years of education: Not on  file   Highest education level: Not on file  Occupational History   Not on file  Tobacco Use   Smoking status: Some Days    Types: Cigarettes    Passive exposure: Current   Smokeless tobacco: Never  Substance and Sexual Activity   Alcohol use: Yes    Alcohol/week: 0.0 standard drinks of alcohol    Comment: drank a quart of beer this morning PT DENIES 09/26/15   Drug use: No    Types: Cocaine, Oxycodone    Comment: oxycodone 2 days ago "my sister gave me one"; no cocaine use   Sexual activity: Not Currently    Comment: opiates  Other Topics  Concern   Not on file  Social History Narrative   Not on file   Social Drivers of Health   Financial Resource Strain: Low Risk  (03/15/2023)   Overall Financial Resource Strain (CARDIA)    Difficulty of Paying Living Expenses: Not hard at all  Food Insecurity: Low Risk  (09/09/2022)   [facility]   Hunger Vital Sign    Worried About Running Out of Food in the Last Year: Never true    Ran Out of Food in the Last Year: Never true  Transportation Needs: No Transportation Needs (09/09/2022)   [facility]   Transportation    In the past 12 months, has lack of reliable transportation kept you from medical appointments, meetings, work or from getting things needed for daily living? : No  Physical Activity: Sufficiently Active (03/15/2023)   Exercise Vital Sign    Days of Exercise per Week: 7 days    Minutes of Exercise per Session: 60 min  Stress: No Stress Concern Present (03/15/2023)   [employer] of Occupational Health - Occupational Stress Questionnaire    Feeling of Stress : Not at all  Social Connections: Moderately Isolated (03/15/2023)   Social Connection and Isolation Panel [NHANES]    Frequency of Communication with Friends and Family: More than three times a week    Frequency of Social Gatherings with Friends and Family: More than three times a week    Attends Religious Services: More than 4 times per year    Active Member of [employer] or Organizations: No    Attends Banker Meetings: Never    Marital Status: Divorced  Catering manager Violence: Not on file   Family History  Problem Relation Age of Onset   Diabetes Father    Hypertension Father    Diabetes Sister    Hypertension Sister    Diabetes Brother    Hypertension Brother    Past Surgical History:  Procedure Laterality Date   I & D EXTREMITY Right 02/26/2017   Procedure: IRRIGATION AND DEBRIDEMENT AND REDUCTION OF RIGHT MIDDLE FINGER;   Surgeon: Dominica Severin, MD;  Location: [facility] OR;  Service: Orthopedics;  Laterality: Right;   STOMACH SURGERY        Mardella Layman, MD 07/02/23 1339

## 2023-07-04 ENCOUNTER — Other Ambulatory Visit: Payer: Self-pay

## 2023-07-05 ENCOUNTER — Other Ambulatory Visit: Payer: Self-pay

## 2023-07-06 ENCOUNTER — Other Ambulatory Visit: Payer: Self-pay

## 2023-07-07 ENCOUNTER — Other Ambulatory Visit: Payer: Self-pay

## 2023-07-17 ENCOUNTER — Other Ambulatory Visit: Payer: Self-pay

## 2023-07-17 ENCOUNTER — Other Ambulatory Visit: Payer: Self-pay | Admitting: Family Medicine

## 2023-07-17 DIAGNOSIS — E1142 Type 2 diabetes mellitus with diabetic polyneuropathy: Secondary | ICD-10-CM

## 2023-07-18 ENCOUNTER — Other Ambulatory Visit: Payer: Self-pay

## 2023-07-19 ENCOUNTER — Telehealth: Payer: Self-pay | Admitting: Family Medicine

## 2023-07-19 ENCOUNTER — Other Ambulatory Visit: Payer: Self-pay

## 2023-07-19 ENCOUNTER — Other Ambulatory Visit (HOSPITAL_COMMUNITY): Payer: Self-pay

## 2023-07-19 ENCOUNTER — Other Ambulatory Visit: Payer: Self-pay | Admitting: Family Medicine

## 2023-07-19 DIAGNOSIS — E1142 Type 2 diabetes mellitus with diabetic polyneuropathy: Secondary | ICD-10-CM

## 2023-07-19 NOTE — Telephone Encounter (Signed)
Last Fill: 04/19/23  Last OV: 06/16/23 Next OV: 09/14/23  Routing to provider for review/authorization.

## 2023-07-19 NOTE — Telephone Encounter (Unsigned)
Copied from CRM [id number]. Topic: Clinical - Medication Refill >> Jul 19, 2023 10:43 AM Nada Libman H wrote: Most Recent Primary Care Visit:  Provider: [name]  Department: [facility]  Visit Type: OFFICE VISIT  Date: 06/16/2023  Medication: Rx #: [id number]  pregabalin (LYRICA) 200 MG capsule [[id number]]    Has the patient contacted their pharmacy? No (Agent: If no, request that the patient contact the pharmacy for the refill. If patient does not wish to contact the pharmacy document the reason why and proceed with request.) (Agent: If yes, when and what did the pharmacy advise?)  Is this the correct pharmacy for this prescription? Yes If no, delete pharmacy and type the correct one.  This is the patient's preferred pharmacy:  [facility] [address], [address] [place] [address] Phone: [phone] Fax: [phone]     Has the prescription been filled recently? No  Is the patient out of the medication? Yes  Has the patient been seen for an appointment in the last year OR does the patient have an upcoming appointment? Yes  Can we respond through MyChart? No  Agent: Please be advised that Rx refills may take up to 3 business days. We ask that you follow-up with your pharmacy.

## 2023-07-19 NOTE — Telephone Encounter (Signed)
Patient was identified as falling into the [place] Measure - Diabetes.   Patient was: Appointment scheduled for lab or office visit for A1c.  FU scheduled with provider on 09/14/23 Last A1C check on 06/16/23

## 2023-07-20 ENCOUNTER — Other Ambulatory Visit: Payer: Self-pay

## 2023-07-20 ENCOUNTER — Ambulatory Visit: Payer: Self-pay | Admitting: Family Medicine

## 2023-07-20 NOTE — Telephone Encounter (Signed)
Copied from CRM [id number]. Topic: Appointments - Appointment Scheduling >> Jul 20, 2023 12:39 PM Gery Pray wrote: Patient/patient representative is calling to schedule an appointment. Refer to attachments for appointment information. Patient calling with severe pain in both his feet. Patient states that pain is about an 8 and he had to stay home from work because he could not walk on them. Patient states that the pain feels like his feet are burning. In addition, patient states the pain started on Monday night around 10 pm.  Chief Complaint: burning in feet Symptoms: burning in feet traveling to legs and knees Frequency: constant Pertinent Negatives: Patient denies numbness and tingling, fever, discoloration Disposition: [] ED /[x] Urgent Care (no appt availability in office) / [] Appointment(In office/virtual)/ []  [facility]/ [] Home Care/ [] Refused Recommended Disposition /[] [place] [facility]/ []  Follow-up with PCP Additional Notes: No apt available.  Instructed to go to UC.  Care advice given, denies questions, instructed to go to er if becomes worse.   Reason for Disposition  [1] Tingling in feet AND [2] new or increased  Answer Assessment - Initial Assessment Questions 1. ONSET: "When did the pain start?"      Sunday 2. LOCATION: "Where is the pain located?"      Both feet 3. PAIN: "How bad is the pain?"    (Scale 1-10; or mild, moderate, severe)  - MILD (1-3): doesn't interfere with normal activities.   - MODERATE (4-7): interferes with normal activities (e.g., work or school) or awakens from sleep, limping.   - SEVERE (8-10): excruciating pain, unable to do any normal activities, unable to walk.      8/10 4. WORK OR EXERCISE: "Has there been any recent work or exercise that involved this part of the body?"      "I can't walk" 5. CAUSE: "What do you think is causing the foot pain?"     "Don't know" but I work on my feet all the time 6. OTHER SYMPTOMS: "Do you have any  other symptoms?" (e.g., leg pain, rash, fever, numbness)     Pain moves towards legs and knees.  7. PREGNANCY: "Is there any chance you are pregnant?" "When was your last menstrual period?"     na  Protocols used: Foot Pain-A-AH

## 2023-07-21 ENCOUNTER — Encounter (HOSPITAL_COMMUNITY): Payer: Self-pay | Admitting: Emergency Medicine

## 2023-07-21 ENCOUNTER — Ambulatory Visit (HOSPITAL_COMMUNITY)
Admission: EM | Admit: 2023-07-21 | Discharge: 2023-07-21 | Disposition: A | Discharge status: Home / Self Care | Payer: Medicaid Other | Attending: Internal Medicine | Admitting: Internal Medicine

## 2023-07-21 ENCOUNTER — Other Ambulatory Visit: Payer: Self-pay

## 2023-07-21 DIAGNOSIS — E1142 Type 2 diabetes mellitus with diabetic polyneuropathy: Secondary | ICD-10-CM | POA: Diagnosis not present

## 2023-07-21 DIAGNOSIS — E0842 Diabetes mellitus due to underlying condition with diabetic polyneuropathy: Secondary | ICD-10-CM

## 2023-07-21 MED ORDER — PREGABALIN 200 MG PO CAPS
200.0000 mg | ORAL_CAPSULE | Freq: Three times a day (TID) | ORAL | 0 refills | Status: DC
  Filled 2023-07-21: qty 90, 30d supply, fill #0

## 2023-07-21 MED ORDER — PREGABALIN 200 MG PO CAPS: 200.0000 mg | ORAL_CAPSULE | Freq: Three times a day (TID) | ORAL | 0 refills | Status: DC

## 2023-07-21 NOTE — ED Provider Notes (Signed)
[facility]    CSN: [id number] Arrival date & time: 07/21/23  1117      History   Chief Complaint Chief Complaint  Patient presents with   Foot Pain    HPI Chad Wolfe is a 58 y.o. male who presents requesting refill of his Lyrica. States he called his PCP's office but the RN told him to come here instead. He was seen for his routine FU last month. His last refill for 3 months was in November and was due to be refilled today. He is working in [place] and has 10 more days to work out of town. His next PCP apt. Is in April. Has chronic diabetic neuropathy from his DM and this medications has been helping. Denies wounds in his feet.     Past Medical History:  Diagnosis Date   Diabetes mellitus type 2 in nonobese (HCC) 01/21/2016   Gastric ulcer    Hepatitis C    Hyperlipidemia    Hypertension    Substance abuse [facility])     Patient Active Problem List   Diagnosis Date Noted   Cheek laceration 09/06/2022   Maxillary fracture (HCC) 09/06/2022   Uninsured 02/04/2021   Transaminitis 02/04/2021   BMI 22.0-22.9, adult 01/15/2021   DKA (diabetic ketoacidosis) (HCC) 01/05/2021   DKA, type 2 (HCC) 01/04/2021   Open finger dislocation 02/26/2017   Diabetes mellitus type 2 in nonobese (HCC) 01/21/2016   Fall from chair 05/15/2015   Alcohol abuse 05/05/2015   Pain, dental 03/27/2013   HTN (hypertension) 04/25/2012   Chronic hepatitis C virus infection (HCC) 11/15/2007   ABDOMINAL PAIN-EPIGASTRIC 11/15/2007    Past Surgical History:  Procedure Laterality Date   I & D EXTREMITY Right 02/26/2017   Procedure: IRRIGATION AND DEBRIDEMENT AND REDUCTION OF RIGHT MIDDLE FINGER;  Surgeon: Dominica Severin, MD;  Location: [facility];  Service: Orthopedics;  Laterality: Right;   STOMACH SURGERY         Home Medications    Prior to Admission medications   Medication Sig Start Date End Date Taking? Authorizing Provider  amLODipine (NORVASC) 10 MG tablet Take 1 tablet (10 mg  total) by mouth daily. 04/19/23   [name], MD  Continuous Glucose Receiver (FREESTYLE LIBRE 2 READER) DEVI Use to check blood sugar continuously throughout the day. 12/06/22   Hoy Register, MD  Continuous Glucose Sensor (FREESTYLE LIBRE 2 SENSOR) MISC Use to check blood sugar continuously throughout the day. 12/06/22   Hoy Register, MD  glucose blood (TRUE METRIX BLOOD GLUCOSE TEST) test strip Use as instructed 01/15/21   [name], [name], PA-C  insulin glargine (LANTUS SOLOSTAR) 100 UNIT/ML Solostar Pen Inject 20 Units into the skin daily. 05/27/23   Hoy Register, MD  Insulin Pen Needle (TECHLITE PLUS PEN NEEDLES) 32G X 4 MM MISC Use to inject Lantus once daily. 05/30/23   Hoy Register, MD  methocarbamol (ROBAXIN) 500 MG tablet Take 1 tablet (500 mg total) by mouth 4 (four) times daily. 05/11/23   [name], MD  pregabalin (LYRICA) 200 MG capsule Take 1 capsule (200 mg total) by mouth in the morning, at noon, and at bedtime. 07/21/23 08/20/23  Rodriguez-Southworth, Nettie Elm, PA-C  TRUEplus Lancets 28G MISC use as directed 01/15/21   [name], [name], PA-C    Family History Family History  Problem Relation Age of Onset   Diabetes Father    Hypertension Father    Diabetes Sister    Hypertension Sister    Diabetes Brother  Hypertension Brother     Social History Social History   Tobacco Use   Smoking status: Former    Types: Cigarettes    Passive exposure: Current   Smokeless tobacco: Never  Vaping Use   Vaping status: Never Used  Substance Use Topics   Alcohol use: Not Currently   Drug use: No    Types: Cocaine, Oxycodone     Allergies   Ibuprofen, Aspirin, Other, and Tylenol [acetaminophen]   Review of Systems Review of Systems  As noted in HPI Physical Exam Triage Vital Signs ED Triage Vitals  Encounter Vitals Group     BP 07/21/23 1452 126/84     Systolic BP Percentile --      Diastolic BP Percentile --      Pulse Rate 07/21/23 1452 (!) 105     Resp  07/21/23 1452 20     Temp 07/21/23 1452 97.7 F (36.5 C)     Temp Source 07/21/23 1452 Oral     SpO2 07/21/23 1452 95 %     Weight --      Height --      Head Circumference --      Peak Flow --      Pain Score 07/21/23 1448 8     Pain Loc --      Pain Education --      Exclude from Growth Chart --    No data found.  Updated Vital Signs BP 126/84 (BP Location: Right Arm)   Pulse (!) 105   Temp 97.7 F (36.5 C) (Oral)   Resp 20   SpO2 95%   Visual Acuity Right Eye Distance:   Left Eye Distance:   Bilateral Distance:    Right Eye Near:   Left Eye Near:    Bilateral Near:     Physical Exam Vitals and nursing note reviewed.  Constitutional:      General: He is not in acute distress.    Appearance: He is normal weight. He is not toxic-appearing.  HENT:     Right Ear: External ear normal.     Left Ear: External ear normal.  Eyes:     General: No scleral icterus.    Conjunctiva/sclera: Conjunctivae normal.  Pulmonary:     Effort: Pulmonary effort is normal.  Musculoskeletal:        General: Normal range of motion.     Cervical back: Neck supple.     Comments: FEET- tender to palpation all over the soles bilaterally, but there are no wounds, redness or deformities. Has fungal nails.   Skin:    General: Skin is warm and dry.     Findings: No bruising, erythema, lesion or rash.  Neurological:     Mental Status: He is alert.  Psychiatric:        Mood and Affect: Mood normal.        Behavior: Behavior normal.        Thought Content: Thought content normal.        Judgment: Judgment normal.      UC Treatments / Results  Labs (all labs ordered are listed, but only abnormal results are displayed) Labs Reviewed - No data to display  EKG   Radiology No results found.  Procedures Procedures (including critical care time)  Medications Ordered in UC Medications - No data to display  Initial Impression / Assessment and Plan / UC Course  I have reviewed the  triage vital signs and the nursing notes.  Chronic polyneuropathy  I refilled his Lyrica for one month. Needs to call his PCP to continue refills since his apt. Is due in April.  Final Clinical Impressions(s) / UC Diagnoses   Final diagnoses:  Diabetic polyneuropathy associated with diabetes mellitus due to underlying condition [facility])   Discharge Instructions   None    ED Prescriptions     Medication Sig Dispense Auth. Provider   pregabalin (LYRICA) 200 MG capsule  (Status: Discontinued) Take 1 capsule (200 mg total) by mouth in the morning, at noon, and at bedtime. 90 capsule Rodriguez-Southworth, Gethsemane Fischler, PA-C   pregabalin (LYRICA) 200 MG capsule Take 1 capsule (200 mg total) by mouth in the morning, at noon, and at bedtime. 90 capsule Rodriguez-Southworth, Nettie Elm, [place]      I have reviewed the PDMP during this encounter.   Garey Ham, [place] 07/21/23 1516

## 2023-07-21 NOTE — ED Triage Notes (Signed)
Bilateral foot pain.  patient is out of pregabalin.  Patient has been out of medicine since yesterday.  Patient lives in [place], but working in [place].

## 2023-07-28 ENCOUNTER — Other Ambulatory Visit: Payer: Self-pay

## 2023-07-29 ENCOUNTER — Other Ambulatory Visit: Payer: Self-pay

## 2023-08-09 ENCOUNTER — Other Ambulatory Visit: Payer: Self-pay

## 2023-08-16 ENCOUNTER — Other Ambulatory Visit: Payer: Self-pay | Admitting: Family Medicine

## 2023-08-16 ENCOUNTER — Encounter: Payer: Self-pay | Admitting: Family

## 2023-08-16 ENCOUNTER — Ambulatory Visit (INDEPENDENT_AMBULATORY_CARE_PROVIDER_SITE_OTHER): Admitting: Family

## 2023-08-16 ENCOUNTER — Other Ambulatory Visit: Payer: Self-pay

## 2023-08-16 VITALS — BP 111/77 | HR 89 | Temp 97.6°F | Ht 69.0 in | Wt 189.0 lb

## 2023-08-16 DIAGNOSIS — I10 Essential (primary) hypertension: Secondary | ICD-10-CM | POA: Diagnosis not present

## 2023-08-16 DIAGNOSIS — H5789 Other specified disorders of eye and adnexa: Secondary | ICD-10-CM | POA: Diagnosis not present

## 2023-08-16 MED ORDER — AMLODIPINE BESYLATE 10 MG PO TABS
10.0000 mg | ORAL_TABLET | Freq: Every day | ORAL | 0 refills | Status: DC
  Filled 2023-08-16: qty 90, 90d supply, fill #0

## 2023-08-16 MED ORDER — DOXYCYCLINE HYCLATE 100 MG PO TABS
100.0000 mg | ORAL_TABLET | Freq: Two times a day (BID) | ORAL | 0 refills | Status: AC
  Filled 2023-08-16: qty 20, 10d supply, fill #0

## 2023-08-16 NOTE — Progress Notes (Signed)
 Patient states, numbness in feet.

## 2023-08-16 NOTE — Progress Notes (Signed)
 Patient ID: Chad Wolfe, male    DOB: 04-Apr-1966  MRN: [id number]  CC: Eye Concern  Subjective: Chad Wolfe is a 58 y.o. male who presents for eye concern.   His concerns today include:  - Reports bilateral eye itching and swelling. Denies red flag symptoms. States several months ago he was prescribed an oral antibiotic which helped.  - Doing well on Amlodipine, no issues/concerns. He does not complain of red flag symptoms such as but not limited to chest pain, shortness of breath, worst headache of life, nausea/vomiting.   Patient Active Problem List   Diagnosis Date Noted   Cheek laceration 09/06/2022   Maxillary fracture (HCC) 09/06/2022   Uninsured 02/04/2021   Transaminitis 02/04/2021   BMI 22.0-22.9, adult 01/15/2021   DKA (diabetic ketoacidosis) (HCC) 01/05/2021   DKA, type 2 (HCC) 01/04/2021   Open finger dislocation 02/26/2017   Diabetes mellitus type 2 in nonobese (HCC) 01/21/2016   Fall from chair 05/15/2015   Alcohol abuse 05/05/2015   Pain, dental 03/27/2013   HTN (hypertension) 04/25/2012   Chronic hepatitis C virus infection (HCC) 11/15/2007   ABDOMINAL PAIN-EPIGASTRIC 11/15/2007     Current Outpatient Medications on File Prior to Visit  Medication Sig Dispense Refill   glucose blood (TRUE METRIX BLOOD GLUCOSE TEST) test strip Use as instructed 100 each 12   insulin glargine (LANTUS SOLOSTAR) 100 UNIT/ML Solostar Pen Inject 20 Units into the skin daily. 6 mL 2   Insulin Pen Needle (TECHLITE PLUS PEN NEEDLES) 32G X 4 MM MISC Use to inject Lantus once daily. 100 each 1   methocarbamol (ROBAXIN) 500 MG tablet Take 1 tablet (500 mg total) by mouth 4 (four) times daily. 60 tablet 3   pregabalin (LYRICA) 200 MG capsule Take 1 capsule (200 mg total) by mouth in the morning, at noon, and at bedtime. 90 capsule 0   TRUEplus Lancets 28G MISC use as directed 100 each 12   Continuous Glucose Receiver (FREESTYLE LIBRE 2 READER) DEVI Use to check blood sugar  continuously throughout the day. (Patient not taking: Reported on 08/16/2023) 1 each 0   Continuous Glucose Sensor (FREESTYLE LIBRE 2 SENSOR) MISC Use to check blood sugar continuously throughout the day. (Patient not taking: Reported on 08/16/2023) 2 each 3   pregabalin (LYRICA) 200 MG capsule Take 1 capsule (200 mg total) by mouth in the morning, at noon, and at bedtime. (Patient not taking: Reported on 08/16/2023) 90 capsule 0   No current facility-administered medications on file prior to visit.    Allergies  Allergen Reactions   Ibuprofen Other (See Comments)    Stomach upset   Aspirin Other (See Comments)   Other Other (See Comments)    MD told him not to take [facility] due to Hep C   Tylenol [Acetaminophen] Other (See Comments)    HEP C    Social History   Socioeconomic History   Marital status: Divorced    Spouse name: Not on file   Number of children: Not on file   Years of education: Not on file   Highest education level: Not on file  Occupational History   Not on file  Tobacco Use   Smoking status: Former    Types: Cigarettes    Passive exposure: Current   Smokeless tobacco: Never  Vaping Use   Vaping status: Never Used  Substance and Sexual Activity   Alcohol use: Not Currently   Drug use: No    Types: Cocaine, Oxycodone  Sexual activity: Not Currently    Comment: opiates  Other Topics Concern   Not on file  Social History Narrative   Not on file   Social Drivers of Health   Financial Resource Strain: Low Risk  (03/15/2023)   Overall Financial Resource Strain (CARDIA)    Difficulty of Paying Living Expenses: Not hard at all  Food Insecurity: Low Risk  (09/09/2022)   [facility]   Hunger Vital Sign    Worried About Running Out of Food in the Last Year: Never true    Ran Out of Food in the Last Year: Never true  Transportation Needs: No Transportation Needs (09/09/2022)   [facility]   Transportation     In the past 12 months, has lack of reliable transportation kept you from medical appointments, meetings, work or from getting things needed for daily living? : No  Physical Activity: Sufficiently Active (03/15/2023)   Exercise Vital Sign    Days of Exercise per Week: 7 days    Minutes of Exercise per Session: 60 min  Stress: No Stress Concern Present (03/15/2023)   [employer] of Occupational Health - Occupational Stress Questionnaire    Feeling of Stress : Not at all  Social Connections: Moderately Isolated (03/15/2023)   Social Connection and Isolation Panel [NHANES]    Frequency of Communication with Friends and Family: More than three times a week    Frequency of Social Gatherings with Friends and Family: More than three times a week    Attends Religious Services: More than 4 times per year    Active Member of [employer] or Organizations: No    Attends Banker Meetings: Never    Marital Status: Divorced  Catering manager Violence: Not on file    Family History  Problem Relation Age of Onset   Diabetes Father    Hypertension Father    Diabetes Sister    Hypertension Sister    Diabetes Brother    Hypertension Brother     Past Surgical History:  Procedure Laterality Date   I & D EXTREMITY Right 02/26/2017   Procedure: IRRIGATION AND DEBRIDEMENT AND REDUCTION OF RIGHT MIDDLE FINGER;  Surgeon: Dominica Severin, MD;  Location: [facility];  Service: Orthopedics;  Laterality: Right;   STOMACH SURGERY      ROS: Review of Systems Negative except as stated above  PHYSICAL EXAM: BP 111/77   Pulse 89   Temp 97.6 F (36.4 C) (Oral)   Ht 5\' 9"  (1.753 m)   Wt 189 lb (85.7 kg)   SpO2 94%   BMI 27.91 kg/m   Physical Exam HENT:     Head: Normocephalic and atraumatic.     Nose: Nose normal.     Mouth/Throat:     Mouth: Mucous membranes are moist.     Pharynx: Oropharynx is clear.  Eyes:     General: Lids are normal. Vision grossly intact. Gaze aligned appropriately.      Extraocular Movements: Extraocular movements intact.     Conjunctiva/sclera: Conjunctivae normal.     Pupils: Pupils are equal, round, and reactive to light.     Comments: Bilateral sclera mildly pink and no eye drainage.  Cardiovascular:     Rate and Rhythm: Normal rate and regular rhythm.     Pulses: Normal pulses.     Heart sounds: Normal heart sounds.  Pulmonary:     Effort: Pulmonary effort is normal.     Breath sounds:  Normal breath sounds.  Musculoskeletal:        General: Normal range of motion.     Cervical back: Normal range of motion and neck supple.  Neurological:     General: No focal deficit present.     Mental Status: He is alert and oriented to person, place, and time.  Psychiatric:        Mood and Affect: Mood normal.        Behavior: Behavior normal.     ASSESSMENT AND PLAN: 1. Eye irritation (Primary) - Doxycycline as prescribed. Counseled on medication adherence/adverse effects.  - Referral to Ophthalmology for evaluation/management.  - Follow-up with primary provider as scheduled. - doxycycline (VIBRA-TABS) 100 MG tablet; Take 1 tablet (100 mg total) by mouth 2 (two) times daily for 10 days.  Dispense: 20 tablet; Refill: 0 - Ambulatory referral to Ophthalmology  2. Essential hypertension - Continue Amlodipine as prescribed.  - Counseled on blood pressure goal of less than 130/80, low-sodium, DASH diet, medication compliance, and 150 minutes of moderate intensity exercise per week as tolerated. Counseled on medication adherence and adverse effects. - Follow-up with primary provider as scheduled. - amLODipine (NORVASC) 10 MG tablet; Take 1 tablet (10 mg total) by mouth daily.  Dispense: 90 tablet; Refill: 0   Patient was given the opportunity to ask questions.  Patient verbalized understanding of the plan and was able to repeat key elements of the plan. Patient was given clear instructions to go to Emergency Department or return to medical center if  symptoms don't improve, worsen, or new problems develop.The patient verbalized understanding.   Orders Placed This Encounter  Procedures   Ambulatory referral to Ophthalmology     Requested Prescriptions   Signed Prescriptions Disp Refills   amLODipine (NORVASC) 10 MG tablet 90 tablet 0    Sig: Take 1 tablet (10 mg total) by mouth daily.   doxycycline (VIBRA-TABS) 100 MG tablet 20 tablet 0    Sig: Take 1 tablet (100 mg total) by mouth 2 (two) times daily for 10 days.    Return for Follow-Up or next available with [name], MD.  Rema Fendt, NP

## 2023-08-19 ENCOUNTER — Other Ambulatory Visit: Payer: Self-pay

## 2023-08-22 ENCOUNTER — Other Ambulatory Visit: Payer: Self-pay | Admitting: Family Medicine

## 2023-08-22 ENCOUNTER — Other Ambulatory Visit: Payer: Self-pay

## 2023-08-23 ENCOUNTER — Ambulatory Visit: Payer: Self-pay | Admitting: Family Medicine

## 2023-08-23 ENCOUNTER — Encounter (HOSPITAL_COMMUNITY): Payer: Self-pay

## 2023-08-23 ENCOUNTER — Telehealth: Payer: Self-pay | Admitting: Emergency Medicine

## 2023-08-23 ENCOUNTER — Other Ambulatory Visit: Payer: Self-pay

## 2023-08-23 ENCOUNTER — Other Ambulatory Visit: Payer: Self-pay | Admitting: Family Medicine

## 2023-08-23 ENCOUNTER — Ambulatory Visit (HOSPITAL_COMMUNITY): Admission: EM | Admit: 2023-08-23 | Discharge: 2023-08-23 | Disposition: A | Discharge status: Home / Self Care

## 2023-08-23 DIAGNOSIS — H5789 Other specified disorders of eye and adnexa: Secondary | ICD-10-CM

## 2023-08-23 DIAGNOSIS — H579 Unspecified disorder of eye and adnexa: Secondary | ICD-10-CM

## 2023-08-23 DIAGNOSIS — E1169 Type 2 diabetes mellitus with other specified complication: Secondary | ICD-10-CM

## 2023-08-23 DIAGNOSIS — E1142 Type 2 diabetes mellitus with diabetic polyneuropathy: Secondary | ICD-10-CM

## 2023-08-23 DIAGNOSIS — E119 Type 2 diabetes mellitus without complications: Secondary | ICD-10-CM

## 2023-08-23 DIAGNOSIS — I10 Essential (primary) hypertension: Secondary | ICD-10-CM

## 2023-08-23 MED ORDER — AZELASTINE HCL 0.05 % OP SOLN
1.0000 [drp] | Freq: Two times a day (BID) | OPHTHALMIC | 0 refills | Status: DC
  Filled 2023-08-23 – 2023-08-25 (×2): qty 6, 30d supply, fill #0
  Filled 2023-08-26: qty 6, 28d supply, fill #0

## 2023-08-23 MED ORDER — PREGABALIN 200 MG PO CAPS
200.0000 mg | ORAL_CAPSULE | Freq: Three times a day (TID) | ORAL | 0 refills | Status: DC
  Filled 2023-08-23: qty 90, 30d supply, fill #0

## 2023-08-23 MED ORDER — LISINOPRIL-HYDROCHLOROTHIAZIDE 20-25 MG PO TABS
1.0000 | ORAL_TABLET | Freq: Every day | ORAL | 0 refills | Status: DC
  Filled 2023-08-23: qty 90, 90d supply, fill #0

## 2023-08-23 MED ORDER — ERYTHROMYCIN 5 MG/GM OP OINT
TOPICAL_OINTMENT | OPHTHALMIC | 0 refills | Status: AC
  Filled 2023-08-23: qty 3.5, 7d supply, fill #0

## 2023-08-23 NOTE — Telephone Encounter (Signed)
 Chad Wolfe "Chad Wolfe" (Patient)   Subject  Chad Wolfe "Chad Wolfe" (Patient)   Topic  Clinical - Prescription Issue     Communication  Reason for CRM: Pt has a refill request for Pregabalin pending since 08/16/2023. Pt wants to know why?        He is also waiting for Lisinopril-hydrochlorothiazide        Best contact: [phone]

## 2023-08-23 NOTE — ED Provider Notes (Signed)
 [facility]    CSN: [id number] Arrival date & time: 08/23/23  1148      History   Chief Complaint Chief Complaint  Patient presents with   Foot Pain   eye itching.   Medication Refill    HPI Chad Wolfe is a 58 y.o. male is requesting refill of Lyrica and Lisinopril which he has requested from his PCP 3/11 and still has not heard from them.   Per records Dr Denyse Amass was just rounted this message this afternoon.  He states his eyes are still itching and the Doxy is not helping. He has apt with eye MD in July   Past Medical History:  Diagnosis Date   Diabetes mellitus type 2 in nonobese (HCC) 01/21/2016   Gastric ulcer    Hepatitis C    Hyperlipidemia    Hypertension    Substance abuse [facility])     Patient Active Problem List   Diagnosis Date Noted   Cheek laceration 09/06/2022   Maxillary fracture (HCC) 09/06/2022   Uninsured 02/04/2021   Transaminitis 02/04/2021   BMI 22.0-22.9, adult 01/15/2021   DKA (diabetic ketoacidosis) (HCC) 01/05/2021   DKA, type 2 (HCC) 01/04/2021   Open finger dislocation 02/26/2017   Diabetes mellitus type 2 in nonobese (HCC) 01/21/2016   Fall from chair 05/15/2015   Alcohol abuse 05/05/2015   Pain, dental 03/27/2013   HTN (hypertension) 04/25/2012   Chronic hepatitis C virus infection (HCC) 11/15/2007   ABDOMINAL PAIN-EPIGASTRIC 11/15/2007    Past Surgical History:  Procedure Laterality Date   I & D EXTREMITY Right 02/26/2017   Procedure: IRRIGATION AND DEBRIDEMENT AND REDUCTION OF RIGHT MIDDLE FINGER;  Surgeon: Dominica Severin, MD;  Location: [facility];  Service: Orthopedics;  Laterality: Right;   STOMACH SURGERY         Home Medications    Prior to Admission medications   Medication Sig Start Date End Date Taking? Authorizing Provider  azelastine (OPTIVAR) 0.05 % ophthalmic solution Place 1 drop into both eyes 2 (two) times daily. 08/23/23  Yes Rodriguez-Southworth, Nettie Elm, PA-C  erythromycin ophthalmic ointment  Place a 1/2 inch ribbon of ointment into the lower  and upper eyelid TWO TIMES A DAY. 08/23/23  Yes Rodriguez-Southworth, Nettie Elm, PA-C  lisinopril (ZESTRIL) 10 MG tablet Take 10 mg by mouth daily.   Yes [provider]  amLODipine (NORVASC) 10 MG tablet Take 1 tablet (10 mg total) by mouth daily. 08/16/23   Rema Fendt, NP  Continuous Glucose Receiver (FREESTYLE LIBRE 2 READER) DEVI Use to check blood sugar continuously throughout the day. Patient not taking: Reported on 08/16/2023 12/06/22   Hoy Register, MD  Continuous Glucose Sensor (FREESTYLE LIBRE 2 SENSOR) MISC Use to check blood sugar continuously throughout the day. Patient not taking: Reported on 08/16/2023 12/06/22   Hoy Register, MD  doxycycline (VIBRA-TABS) 100 MG tablet Take 1 tablet (100 mg total) by mouth 2 (two) times daily for 10 days. 08/16/23 08/26/23  Rema Fendt, NP  glucose blood (TRUE METRIX BLOOD GLUCOSE TEST) test strip Use as instructed 01/15/21   [name], [name], PA-C  insulin glargine (LANTUS SOLOSTAR) 100 UNIT/ML Solostar Pen Inject 20 Units into the skin daily. 05/27/23   Hoy Register, MD  Insulin Pen Needle (TECHLITE PLUS PEN NEEDLES) 32G X 4 MM MISC Use to inject Lantus once daily. 05/30/23   Hoy Register, MD  lisinopril-hydrochlorothiazide (ZESTORETIC) 20-25 MG tablet Take 1 tablet by mouth daily. 08/23/23   [name], MD  pregabalin (  LYRICA) 200 MG capsule Take 1 capsule (200 mg total) by mouth in the morning, at noon, and at bedtime. 08/23/23   [name], MD  TRUEplus Lancets 28G MISC use as directed 01/15/21   [name], [name], PA-C    Family History Family History  Problem Relation Age of Onset   Diabetes Father    Hypertension Father    Diabetes Sister    Hypertension Sister    Diabetes Brother    Hypertension Brother     Social History Social History   Tobacco Use   Smoking status: Former    Types: Cigarettes    Passive exposure: Current   Smokeless tobacco: Never  Vaping  Use   Vaping status: Never Used  Substance Use Topics   Alcohol use: Not Currently   Drug use: Not Currently    Types: Cocaine, Oxycodone     Allergies   Ibuprofen, Aspirin, Other, and Tylenol [acetaminophen]   Review of Systems Review of Systems As noted in HPI  Physical Exam Triage Vital Signs ED Triage Vitals  Encounter Vitals Group     BP 08/23/23 1319 135/87     Systolic BP Percentile --      Diastolic BP Percentile --      Pulse Rate 08/23/23 1319 81     Resp 08/23/23 1319 16     Temp 08/23/23 1319 97.6 F (36.4 C)     Temp Source 08/23/23 1319 Oral     SpO2 08/23/23 1319 95 %     Weight --      Height --      Head Circumference --      Peak Flow --      Pain Score 08/23/23 1318 8     Pain Loc --      Pain Education --      Exclude from Growth Chart --    No data found.  Updated Vital Signs BP 135/87 (BP Location: Left Arm)   Pulse 81   Temp 97.6 F (36.4 C) (Oral)   Resp 16   SpO2 95%   Visual Acuity Right Eye Distance:   Left Eye Distance:   Bilateral Distance:    Right Eye Near:   Left Eye Near:    Bilateral Near:     Physical Exam Vitals and nursing note reviewed.  Constitutional:      General: He is not in acute distress.    Appearance: He is normal weight. He is not toxic-appearing.  HENT:     Right Ear: External ear normal.     Left Ear: External ear normal.     Nose: Nose normal.  Eyes:     General: No scleral icterus.       Right eye: No discharge.        Left eye: No discharge.     Conjunctiva/sclera: Conjunctivae normal.     Comments: Has dry skin of upper and lower lids, scleras are a little pink. No purulent drainage noted. Has hypopigmented skin on medial upper lids bilaterally.   Pulmonary:     Effort: Pulmonary effort is normal.  Musculoskeletal:        General: Normal range of motion.     Cervical back: Neck supple.  Neurological:     Mental Status: He is alert and oriented to person, place, and time.     Gait:  Gait normal.  Psychiatric:        Mood and Affect: Mood normal.  Behavior: Behavior normal.        Thought Content: Thought content normal.        Judgment: Judgment normal.      UC Treatments / Results  Labs (all labs ordered are listed, but only abnormal results are displayed) Labs Reviewed - No data to display  EKG   Radiology No results found.  Procedures Procedures (including critical care time)  Medications Ordered in UC Medications - No data to display  Initial Impression / Assessment and Plan / UC Course  I have reviewed the triage vital signs and the nursing notes.  Chronic eye lid dryness and itching  I placed him on Optivar and Erythromycin ointment as noted He was given another ophthalmology group to contact and see if they could get him in sooner.  He was explained that his PCP has his message about his medication refill request which was routed to her just today. To away for her response. He understands       Final Clinical Impressions(s) / UC Diagnoses   Final diagnoses:  Itchy eyes     Discharge Instructions      See if [facility] ophthalmology can see you sooner than July     ED Prescriptions     Medication Sig Dispense Auth. Provider   azelastine (OPTIVAR) 0.05 % ophthalmic solution Place 1 drop into both eyes 2 (two) times daily. 6 mL Rodriguez-Southworth, Oluwakemi Salsberry, PA-C   erythromycin ophthalmic ointment Place a 1/2 inch ribbon of ointment into the lower  and upper eyelid TWO TIMES A DAY. 3.5 g Rodriguez-Southworth, Nettie Elm, PA-C      PDMP not reviewed this encounter.   Garey Ham, PA-C 08/23/23 1539

## 2023-08-23 NOTE — ED Triage Notes (Addendum)
 Patient reports that he has "chronic nerve pain" in both feet and reports that it is getting worse.Patient been out of his Pregabalin and is requesting a refill.  Patient also reports that he has had itchy red eyes x 2 months. Patient states he has taken 1 round of antibiotics for the same and is currently on the second round and has 8 pills left to take and his eyes are not getting any better.  Patient is also requesting Lisinopril refill

## 2023-08-23 NOTE — Telephone Encounter (Signed)
 Patient is requesting refill for medication

## 2023-08-23 NOTE — Telephone Encounter (Signed)
 Copied from CRM [id number]. Topic: Clinical - Red Word Triage >> Aug 23, 2023  9:58 AM Franchot Heidelberg wrote: Red Word that prompted transfer to Nurse Triage: Pt is diabetic and is experiencing nerve pain and numbness in his feet. He has been waiting for a refill of pregabalin since 08/16/2023  Patient very frustrated d/t med refill not being called in.  Medications pended for refill and sent to office.

## 2023-08-23 NOTE — Discharge Instructions (Signed)
 See if [facility] can see you sooner than July

## 2023-08-24 ENCOUNTER — Other Ambulatory Visit: Payer: Self-pay

## 2023-08-24 NOTE — Telephone Encounter (Signed)
 Prescriptions have been filled yesterday

## 2023-08-25 ENCOUNTER — Other Ambulatory Visit: Payer: Self-pay

## 2023-08-25 ENCOUNTER — Other Ambulatory Visit (HOSPITAL_BASED_OUTPATIENT_CLINIC_OR_DEPARTMENT_OTHER): Payer: Self-pay

## 2023-08-26 ENCOUNTER — Other Ambulatory Visit: Payer: Self-pay

## 2023-08-29 ENCOUNTER — Other Ambulatory Visit: Payer: Self-pay | Admitting: Family Medicine

## 2023-08-29 ENCOUNTER — Ambulatory Visit: Payer: Self-pay

## 2023-08-29 ENCOUNTER — Telehealth: Payer: Self-pay

## 2023-08-29 ENCOUNTER — Other Ambulatory Visit: Payer: Self-pay

## 2023-08-29 NOTE — Telephone Encounter (Signed)
 This RN was transferred a patient who was at pharmacy needing urgent refill on Lantus, stating he ran out yesterday. Patient did not realize his PCP needed to be consulted for this refill, stating he has been on Lantus for 8 months and has not had to have PCP approval for refills until now. This RN attempted to call on-call provider, Randa Evens, twice with no answer. This RN advised patient to keep close monitor of blood glucose levels overnight and go to Urgent Care if glucose gets too high. Patient verbalized understanding. Advised patient refill request is sent in as of now.   Copied from CRM [id number]. Topic: Clinical - Medication Refill >> Aug 29, 2023  5:19 PM Alessandra Bevels wrote: Most Recent Primary Care Visit:  Provider: Ricky Stabs J  Department: [facility]  Visit Type: ACUTE  Date: 08/16/2023  Medication: insulin glargine (LANTUS SOLOSTAR) 100 UNIT/ML Solostar Pen  Has the patient contacted their pharmacy? Yes (Agent: If no, request that the patient contact the pharmacy for the refill. If patient does not wish to contact the pharmacy document the reason why and proceed with request.) (Agent: If yes, when and what did the pharmacy advise?)  Is this the correct pharmacy for this prescription? Yes If no, delete pharmacy and type the correct one.  This is the patient's preferred pharmacy:  [facility] [address] [place] [address] Phone: [phone] Fax: [phone]  [facility] [address] [place] [address] Phone: [phone] Fax: [phone]  [id number] [place], [place] - [address] AT [facility] [address] [place] [address] Phone: [phone] Fax: [phone]  [facility] [address] ([address] [place] - [address] [place] ([place] [address] Phone: [phone] Fax: [phone]  [facility] [address] [place] [address] Phone: [phone] Fax: [phone]   Has the prescription been filled recently? Yes  Is the patient out of the medication? Yes  Has the patient been seen for an appointment in the last year OR does the patient have an upcoming appointment? Yes  Can we respond through MyChart? Yes  Agent: Please be advised that Rx refills may take up to 3 business days. We ask that you follow-up with your pharmacy. Reason for Disposition  [1] Caller requests to speak ONLY to PCP AND [2] URGENT question  Answer Assessment - Initial Assessment Questions 1. REASON FOR CALL or QUESTION: "What is your reason for calling today?" or "How can I best help you?" or "What question do you have that I can help answer?"     Patient is currently completely out of the Lantus - last shot was yesterday  2. CALLER: Document the source of call. (e.g., laboratory, patient).     Patient  Protocols used: PCP Call - No Triage-A-AH

## 2023-08-29 NOTE — Telephone Encounter (Signed)
 Copied from CRM [id number]. Topic: Clinical - Medication Refill >> Aug 29, 2023  5:19 PM Alessandra Bevels wrote: Most Recent Primary Care Visit:  Provider: Ricky Stabs J  Department: [facility]  Visit Type: ACUTE  Date: 08/16/2023  Medication: insulin glargine (LANTUS SOLOSTAR) 100 UNIT/ML Solostar Pen  Has the patient contacted their pharmacy? Yes (Agent: If no, request that the patient contact the pharmacy for the refill. If patient does not wish to contact the pharmacy document the reason why and proceed with request.) (Agent: If yes, when and what did the pharmacy advise?)  Is this the correct pharmacy for this prescription? Yes If no, delete pharmacy and type the correct one.  This is the patient's preferred pharmacy:  [facility] [address] [place] [address] Phone: [phone] Fax: [phone]  [facility] [address] [place] [address] Phone: [phone] Fax: [phone]  [id number] [place], [place] - [address] AT [facility] [address] [place] [address] Phone: [phone] Fax: [phone]  [facility] [address] [place] - [address] [place] ([place] [address] Phone: [phone] Fax: [phone]  [facility] [address] [place] [address] Phone: [phone] Fax: [phone]   Has the prescription been filled recently? Yes  Is the patient out of the medication? Yes  Has the patient been seen for an appointment in the last year OR does the patient have an upcoming appointment? Yes  Can we respond through MyChart? Yes  Agent: Please be advised that Rx refills may take up to 3 business days. We ask that you follow-up with your pharmacy.

## 2023-08-29 NOTE — Telephone Encounter (Signed)
 Reason for CRM: Patient requesting to speak to Dr. Tawana Scale nurse. Patient requested refill via pharmacy for insulin glargine (LANTUS SOLOSTAR) 100 UNIT/ML Solostar Pen.        Advised patient message routed to Provider so script could be sent in. Patient stated he was unsure how long that would take and requested to speak to Dr. Tawana Scale nurse directly.        Best call back number: [phone]

## 2023-08-30 ENCOUNTER — Other Ambulatory Visit: Payer: Self-pay

## 2023-08-30 ENCOUNTER — Other Ambulatory Visit: Payer: Self-pay | Admitting: Family

## 2023-08-30 ENCOUNTER — Other Ambulatory Visit: Payer: Self-pay | Admitting: *Deleted

## 2023-08-30 ENCOUNTER — Other Ambulatory Visit: Payer: Self-pay | Admitting: Family Medicine

## 2023-08-30 DIAGNOSIS — H5789 Other specified disorders of eye and adnexa: Secondary | ICD-10-CM

## 2023-08-30 MED ORDER — LANTUS SOLOSTAR 100 UNIT/ML ~~LOC~~ SOPN
20.0000 [IU] | PEN_INJECTOR | Freq: Every day | SUBCUTANEOUS | 2 refills | Status: DC
  Filled 2023-08-30: qty 6, 30d supply, fill #0
  Filled 2023-09-23: qty 6, 30d supply, fill #1
  Filled 2023-10-24: qty 6, 30d supply, fill #2

## 2023-08-30 NOTE — Telephone Encounter (Signed)
Meds has been refilled. 

## 2023-08-30 NOTE — Telephone Encounter (Signed)
 Duplicate request, Rx was refilled today. No further action is needed at this time

## 2023-09-08 ENCOUNTER — Other Ambulatory Visit: Payer: Self-pay

## 2023-09-14 ENCOUNTER — Ambulatory Visit: Payer: Medicaid Other | Admitting: Family Medicine

## 2023-09-16 ENCOUNTER — Ambulatory Visit: Admitting: Family Medicine

## 2023-09-16 ENCOUNTER — Encounter: Payer: Self-pay | Admitting: Family Medicine

## 2023-09-16 VITALS — BP 121/80 | HR 71 | Wt 186.0 lb

## 2023-09-16 DIAGNOSIS — I10 Essential (primary) hypertension: Secondary | ICD-10-CM

## 2023-09-16 DIAGNOSIS — Z794 Long term (current) use of insulin: Secondary | ICD-10-CM

## 2023-09-16 DIAGNOSIS — E119 Type 2 diabetes mellitus without complications: Secondary | ICD-10-CM

## 2023-09-16 LAB — POCT GLYCOSYLATED HEMOGLOBIN (HGB A1C): HbA1c, POC (controlled diabetic range): 8.1 % — AB (ref 0.0–7.0)

## 2023-09-19 ENCOUNTER — Other Ambulatory Visit: Payer: Self-pay | Admitting: Family Medicine

## 2023-09-20 ENCOUNTER — Other Ambulatory Visit: Payer: Self-pay

## 2023-09-21 ENCOUNTER — Encounter: Payer: Self-pay | Admitting: Family Medicine

## 2023-09-21 NOTE — Progress Notes (Signed)
 Established Patient Office Visit  Subjective    Patient ID: Chad Wolfe, male    DOB: 23-Oct-1965  Age: 58 y.o. MRN: [id number]  CC:  Chief Complaint  Patient presents with   Medical Management of Chronic Issues    Numbness in legs and feet     HPI RANDELL TEARE presents for routine follow up of diabetes and hypertension. Patient reports med compliance and denies acute complaints.   Outpatient Encounter Medications as of 09/16/2023  Medication Sig   amLODipine (NORVASC) 10 MG tablet Take 1 tablet (10 mg total) by mouth daily.   azelastine (OPTIVAR) 0.05 % ophthalmic solution Place 1 drop into both eyes 2 (two) times daily.   Continuous Glucose Receiver (FREESTYLE LIBRE 2 READER) DEVI Use to check blood sugar continuously throughout the day.   Continuous Glucose Sensor (FREESTYLE LIBRE 2 SENSOR) MISC Use to check blood sugar continuously throughout the day.   erythromycin ophthalmic ointment Place a 1/2 inch ribbon of ointment into the lower  and upper eyelid TWO TIMES A DAY.   glucose blood (TRUE METRIX BLOOD GLUCOSE TEST) test strip Use as instructed   insulin glargine (LANTUS SOLOSTAR) 100 UNIT/ML Solostar Pen Inject 20 Units into the skin daily.   Insulin Pen Needle (TECHLITE PLUS PEN NEEDLES) 32G X 4 MM MISC Use to inject Lantus once daily.   lisinopril (ZESTRIL) 10 MG tablet Take 10 mg by mouth daily.   lisinopril-hydrochlorothiazide (ZESTORETIC) 20-25 MG tablet Take 1 tablet by mouth daily.   pregabalin (LYRICA) 200 MG capsule Take 1 capsule (200 mg total) by mouth in the morning, at noon, and at bedtime.   TRUEplus Lancets 28G MISC use as directed   doxycycline (VIBRA-TABS) 100 MG tablet Take 1 tablet (100 mg total) by mouth 2 (two) times daily for 10 days.   No facility-administered encounter medications on file as of 09/16/2023.    Past Medical History:  Diagnosis Date   Diabetes mellitus type 2 in nonobese (HCC) 01/21/2016   Gastric ulcer    Hepatitis C     Hyperlipidemia    Hypertension    Substance abuse (HCC)     Past Surgical History:  Procedure Laterality Date   I & D EXTREMITY Right 02/26/2017   Procedure: IRRIGATION AND DEBRIDEMENT AND REDUCTION OF RIGHT MIDDLE FINGER;  Surgeon: Dominica Severin, MD;  Location: [facility];  Service: Orthopedics;  Laterality: Right;   STOMACH SURGERY      Family History  Problem Relation Age of Onset   Diabetes Father    Hypertension Father    Diabetes Sister    Hypertension Sister    Diabetes Brother    Hypertension Brother     Social History   Socioeconomic History   Marital status: Divorced    Spouse name: Not on file   Number of children: Not on file   Years of education: Not on file   Highest education level: Not on file  Occupational History   Not on file  Tobacco Use   Smoking status: Former    Types: Cigarettes    Passive exposure: Current   Smokeless tobacco: Never  Vaping Use   Vaping status: Never Used  Substance and Sexual Activity   Alcohol use: Not Currently   Drug use: Not Currently    Types: Cocaine, Oxycodone   Sexual activity: Not Currently    Comment: opiates  Other Topics Concern   Not on file  Social History Narrative   Not on file  Social Drivers of Corporate investment banker Strain: Low Risk  (03/15/2023)   Overall Financial Resource Strain (CARDIA)    Difficulty of Paying Living Expenses: Not hard at all  Food Insecurity: Low Risk  (09/09/2022)   [facility]   Hunger Vital Sign    Worried About Running Out of Food in the Last Year: Never true    Ran Out of Food in the Last Year: Never true  Transportation Needs: No Transportation Needs (09/09/2022)   [facility]   Transportation    In the past 12 months, has lack of reliable transportation kept you from medical appointments, meetings, work or from getting things needed for daily living? : No  Physical Activity: Sufficiently Active (03/15/2023)    Exercise Vital Sign    Days of Exercise per Week: 7 days    Minutes of Exercise per Session: 60 min  Stress: No Stress Concern Present (03/15/2023)   [employer] of Occupational Health - Occupational Stress Questionnaire    Feeling of Stress : Not at all  Social Connections: Moderately Isolated (03/15/2023)   Social Connection and Isolation Panel [NHANES]    Frequency of Communication with Friends and Family: More than three times a week    Frequency of Social Gatherings with Friends and Family: More than three times a week    Attends Religious Services: More than 4 times per year    Active Member of [employer] or Organizations: No    Attends Banker Meetings: Never    Marital Status: Divorced  Catering manager Violence: Not on file    Review of Systems  All other systems reviewed and are negative.       Objective    BP 121/80 (BP Location: Left Arm, Patient Position: Sitting, Cuff Size: Normal)   Pulse 71   Wt 186 lb (84.4 kg)   SpO2 95%   BMI 27.47 kg/m   Physical Exam Vitals and nursing note reviewed.  Constitutional:      General: He is not in acute distress. Cardiovascular:     Rate and Rhythm: Normal rate and regular rhythm.  Pulmonary:     Effort: Pulmonary effort is normal.  Abdominal:     General: There is no distension.     Palpations: Abdomen is soft.  Neurological:     General: No focal deficit present.     Mental Status: He is alert and oriented to person, place, and time.         Assessment & Plan:   1. Diabetes mellitus type 2 in nonobese (HCC) (Primary) A1c stable but remains above goal. Discussed compliance. Patient referred to diabetic education.  - POCT glycosylated hemoglobin (Hb A1C)  2. Essential hypertension Appears stable. Continue   3. Encounter for long-term (current) use of insulin (HCC)      Return in about 3 months (around 12/16/2023) for follow up, chronic med issues.   Arlo Lama, MD

## 2023-09-22 ENCOUNTER — Other Ambulatory Visit: Payer: Self-pay

## 2023-09-22 MED ORDER — PREGABALIN 200 MG PO CAPS
200.0000 mg | ORAL_CAPSULE | Freq: Three times a day (TID) | ORAL | 0 refills | Status: DC
  Filled 2023-09-26: qty 90, 30d supply, fill #0

## 2023-09-26 ENCOUNTER — Other Ambulatory Visit: Payer: Self-pay

## 2023-10-04 ENCOUNTER — Other Ambulatory Visit: Payer: Self-pay

## 2023-10-04 ENCOUNTER — Encounter (HOSPITAL_COMMUNITY): Payer: Self-pay

## 2023-10-04 ENCOUNTER — Ambulatory Visit (HOSPITAL_COMMUNITY)
Admission: EM | Admit: 2023-10-04 | Discharge: 2023-10-04 | Disposition: A | Discharge status: Home / Self Care | Attending: Physician Assistant | Admitting: Physician Assistant

## 2023-10-04 DIAGNOSIS — S025XXA Fracture of tooth (traumatic), initial encounter for closed fracture: Secondary | ICD-10-CM

## 2023-10-04 DIAGNOSIS — K047 Periapical abscess without sinus: Secondary | ICD-10-CM | POA: Diagnosis not present

## 2023-10-04 MED ORDER — AMOXICILLIN-POT CLAVULANATE 875-125 MG PO TABS
1.0000 | ORAL_TABLET | Freq: Two times a day (BID) | ORAL | 0 refills | Status: DC
  Filled 2023-10-04: qty 14, 7d supply, fill #0

## 2023-10-04 MED ORDER — KETOROLAC TROMETHAMINE 30 MG/ML IJ SOLN
30.0000 mg | Freq: Once | INTRAMUSCULAR | Status: AC
  Administered 2023-10-04: 30 mg via INTRAMUSCULAR

## 2023-10-04 MED ORDER — KETOROLAC TROMETHAMINE 30 MG/ML IJ SOLN
INTRAMUSCULAR | Status: AC
  Filled 2023-10-04: qty 1

## 2023-10-04 NOTE — ED Triage Notes (Signed)
 Patient presents for tooth pain x 2 days.  Home Intervention: OTC meds

## 2023-10-04 NOTE — ED Provider Notes (Signed)
 [facility]    CSN: [id number] Arrival date & time: 10/04/23  1539      History   Chief Complaint No chief complaint on file.   HPI Chad Wolfe is a 58 y.o. male.   HPI  He reports concerns for tooth pain and cheek pain for the past few days He reports he has had a recent broken tooth and thinks there is an infection present  He states he has tried taking OTC pain relievers but this has not provided much relief  He has an apt with dentist on 5-19  He has a hx of Hep C and has been told not to take Tylenol  products  eGFR is 70 on most recent labs (03/02/23)    Past Medical History:  Diagnosis Date   Diabetes mellitus type 2 in nonobese (HCC) 01/21/2016   Gastric ulcer    Hepatitis C    Hyperlipidemia    Hypertension    Substance abuse [facility])     Patient Active Problem List   Diagnosis Date Noted   Cheek laceration 09/06/2022   Maxillary fracture (HCC) 09/06/2022   Uninsured 02/04/2021   Transaminitis 02/04/2021   BMI 22.0-22.9, adult 01/15/2021   DKA (diabetic ketoacidosis) (HCC) 01/05/2021   DKA, type 2 (HCC) 01/04/2021   Open finger dislocation 02/26/2017   Diabetes mellitus type 2 in nonobese (HCC) 01/21/2016   Fall from chair 05/15/2015   Alcohol abuse 05/05/2015   Pain, dental 03/27/2013   HTN (hypertension) 04/25/2012   Chronic hepatitis C virus infection (HCC) 11/15/2007   ABDOMINAL PAIN-EPIGASTRIC 11/15/2007    Past Surgical History:  Procedure Laterality Date   I & D EXTREMITY Right 02/26/2017   Procedure: IRRIGATION AND DEBRIDEMENT AND REDUCTION OF RIGHT MIDDLE FINGER;  Surgeon: Ronn Cohn, MD;  Location: [facility] OR;  Service: Orthopedics;  Laterality: Right;   STOMACH SURGERY         Home Medications    Prior to Admission medications   Medication Sig Start Date End Date Taking? Authorizing Provider  amLODipine  (NORVASC ) 10 MG tablet Take 1 tablet (10 mg total) by mouth daily. 08/16/23  Yes Rogerio Clay, Amy J, NP   amoxicillin -clavulanate (AUGMENTIN ) 875-125 MG tablet Take 1 tablet by mouth every 12 (twelve) hours. 10/04/23  Yes Davyon Fisch E, PA-C  azelastine  (OPTIVAR ) 0.05 % ophthalmic solution Place 1 drop into both eyes 2 (two) times daily. 08/23/23  Yes Rodriguez-Southworth, Lamond Pilot, PA-C  Continuous Glucose Receiver (FREESTYLE LIBRE 2 READER) DEVI Use to check blood sugar continuously throughout the day. 12/06/22  Yes Newlin, Enobong, MD  Continuous Glucose Sensor (FREESTYLE LIBRE 2 SENSOR) MISC Use to check blood sugar continuously throughout the day. 12/06/22  Yes Newlin, Enobong, MD  glucose blood (TRUE METRIX BLOOD GLUCOSE TEST) test strip Use as instructed 01/15/21  Yes [name], [name], PA-C  insulin  glargine (LANTUS  SOLOSTAR) 100 UNIT/ML Solostar Pen Inject 20 Units into the skin daily. 08/30/23  Yes Abraham Abo, MD  Insulin  Pen Needle (TECHLITE PLUS PEN NEEDLES) 32G X 4 MM MISC Use to inject Lantus  once daily. 05/30/23  Yes Newlin, Enobong, MD  lisinopril  (ZESTRIL ) 10 MG tablet Take 10 mg by mouth daily.   Yes [provider]  lisinopril -hydrochlorothiazide  (ZESTORETIC ) 20-25 MG tablet Take 1 tablet by mouth daily. 08/23/23  Yes Abraham Abo, MD  pregabalin  (LYRICA ) 200 MG capsule Take 1 capsule (200 mg total) by mouth in the morning, at noon, and at bedtime. 09/22/23  Yes Abraham Abo, MD  TRUEplus Lancets 28G  MISC use as directed 01/15/21  Yes [name], [name], PA-C  doxycycline  (VIBRA -TABS) 100 MG tablet Take 1 tablet (100 mg total) by mouth 2 (two) times daily for 10 days. 08/16/23 08/26/23  Senaida Dama, NP  erythromycin  ophthalmic ointment Place a 1/2 inch ribbon of ointment into the lower  and upper eyelid TWO TIMES A DAY. 08/23/23   Rodriguez-Southworth, Lamond Pilot, PA-C    Family History Family History  Problem Relation Age of Onset   Diabetes Father    Hypertension Father    Diabetes Sister    Hypertension Sister    Diabetes Brother    Hypertension Brother     Social  History Social History   Tobacco Use   Smoking status: Former    Types: Cigarettes    Passive exposure: Current   Smokeless tobacco: Never  Vaping Use   Vaping status: Never Used  Substance Use Topics   Alcohol use: Not Currently   Drug use: Not Currently    Types: Cocaine, Oxycodone      Allergies   Ibuprofen , Aspirin, Other, and Tylenol  [acetaminophen ]   Review of Systems Review of Systems  Constitutional:  Negative for chills and fever.  HENT:  Positive for dental problem and facial swelling.      Physical Exam Triage Vital Signs ED Triage Vitals  Encounter Vitals Group     BP 10/04/23 1550 95/67     Systolic BP Percentile --      Diastolic BP Percentile --      Pulse Rate 10/04/23 1549 (!) 102     Resp 10/04/23 1549 18     Temp 10/04/23 1549 97.8 F (36.6 C)     Temp Source 10/04/23 1549 Oral     SpO2 10/04/23 1549 95 %     Weight --      Height --      Head Circumference --      Peak Flow --      Pain Score --      Pain Loc --      Pain Education --      Exclude from Growth Chart --    No data found.  Updated Vital Signs BP 95/67 (BP Location: Left Arm)   Pulse (!) 102   Temp 97.8 F (36.6 C) (Oral)   Resp 16   SpO2 95%   Visual Acuity Right Eye Distance:   Left Eye Distance:   Bilateral Distance:    Right Eye Near:   Left Eye Near:    Bilateral Near:     Physical Exam Vitals reviewed.  Constitutional:      General: He is awake. He is in acute distress.     Appearance: He is well-developed and well-groomed. He is ill-appearing.  HENT:     Head: Normocephalic. Raccoon eyes present.     Mouth/Throat:     Lips: Pink.     Mouth: Mucous membranes are moist.     Dentition: Abnormal dentition. Dental tenderness, gingival swelling, dental caries and dental abscesses present.     Pharynx: Oropharynx is clear. Uvula midline. No pharyngeal swelling, posterior oropharyngeal erythema or postnasal drip.     Comments: Patient has significant  abnormalities and breakage to teeth 9, 10, 11.  There is also significant gingival swelling and tenderness surrounding these teeth.  Physical exam also reveals swelling of the left cheek as well  Pulmonary:     Effort: Pulmonary effort is normal.  Neurological:     Mental Status: He  is alert.  Psychiatric:        Behavior: Behavior is cooperative.      UC Treatments / Results  Labs (all labs ordered are listed, but only abnormal results are displayed) Labs Reviewed - No data to display  EKG   Radiology No results found.  Procedures Procedures (including critical care time)  Medications Ordered in UC Medications  ketorolac (TORADOL) 30 MG/ML injection 30 mg (30 mg Intramuscular Given 10/04/23 1708)    Initial Impression / Assessment and Plan / UC Course  I have reviewed the triage vital signs and the nursing notes.  Pertinent labs & imaging results that were available during my care of the patient were reviewed by me and considered in my medical decision making (see chart for details).      Final Clinical Impressions(s) / UC Diagnoses   Final diagnoses:  Dental infection  Broken teeth   Patient presents today with concerns for dental pain and potential infection.  He reports that he started having left upper tooth pain a few days ago and it has gotten steadily worse.  He was evaluated by dentist this week who instructed him to come to urgent care for further evaluation.  He has a follow-up appointment on 10/24/2023 for planned extraction based on his report.  Physical exam is notable for several broken teeth and gingival swelling.  At this time I am concerned for potential dental abscess given degree of swelling and cheek displacement.  Will start Augmentin  p.o. twice daily x 7 days.  Patient reports that he has previous history of hepatitis C and was instructed not to take Tylenol  products by previous MD.  Patient reports that he cannot tolerate NSAIDs without issue even  though ibuprofen  is listed on allergy list.  Will provide Toradol 30 mg injection to assist with pain.  Recommend over-the-counter pain relievers as tolerated for further pain relief at home.  Recommend keeping upcoming dental appointment for more definitive management.  ED and return precautions reviewed and provided after visit summary.  Follow-up as needed.    Discharge Instructions      You are seen today for concerns of dental pain and broken teeth.  At this time it appears that you may have a dental infection which needs to be treated with antibiotics.  I have sent in a medication called Augmentin  for you to take twice per day for 7 days to help treat the infection.  Please take this as directed and finish the entire course of the medication even if you start to feel better before it is done.  Please finish the entire course unless you develop an allergic reaction or if a medical provider tells you to stop. We have provided you with a Toradol 30 mg injection to assist with your pain.  This typically lasts for a few days and it is not recommended that you take other NSAID medications for the next 2 days to prevent kidney injury.  Please make sure that you are staying well-hydrated with plenty of water.  If at any point you start to develop severe swelling of the face, fever or chills that are not responding to over-the-counter medications, difficulty eating or drinking, difficulty turning the head, choking please go to the emergency room as these could be signs of a medical emergency.  Please keep your upcoming appointment with your dentist for more definitive management.     ED Prescriptions     Medication Sig Dispense Auth. Provider   amoxicillin -clavulanate (AUGMENTIN )  875-125 MG tablet Take 1 tablet by mouth every 12 (twelve) hours. 14 tablet Bush Murdoch E, PA-C      PDMP not reviewed this encounter.   Jerona Mooring, PA-C 10/04/23 1738

## 2023-10-04 NOTE — Discharge Instructions (Addendum)
 You are seen today for concerns of dental pain and broken teeth.  At this time it appears that you may have a dental infection which needs to be treated with antibiotics.  I have sent in a medication called Augmentin  for you to take twice per day for 7 days to help treat the infection.  Please take this as directed and finish the entire course of the medication even if you start to feel better before it is done.  Please finish the entire course unless you develop an allergic reaction or if a medical provider tells you to stop. We have provided you with a Toradol 30 mg injection to assist with your pain.  This typically lasts for a few days and it is not recommended that you take other NSAID medications for the next 2 days to prevent kidney injury.  Please make sure that you are staying well-hydrated with plenty of water.  If at any point you start to develop severe swelling of the face, fever or chills that are not responding to over-the-counter medications, difficulty eating or drinking, difficulty turning the head, choking please go to the emergency room as these could be signs of a medical emergency.  Please keep your upcoming appointment with your dentist for more definitive management.

## 2023-10-13 NOTE — Telephone Encounter (Signed)
 error

## 2023-10-19 ENCOUNTER — Other Ambulatory Visit: Payer: Self-pay | Admitting: Family Medicine

## 2023-10-21 ENCOUNTER — Other Ambulatory Visit (HOSPITAL_COMMUNITY): Payer: Self-pay

## 2023-10-24 ENCOUNTER — Other Ambulatory Visit: Payer: Self-pay | Admitting: Family Medicine

## 2023-10-24 ENCOUNTER — Other Ambulatory Visit: Payer: Self-pay

## 2023-10-24 MED ORDER — PREGABALIN 200 MG PO CAPS
200.0000 mg | ORAL_CAPSULE | Freq: Three times a day (TID) | ORAL | 0 refills | Status: DC
Start: 2023-10-24 — End: 2023-11-28
  Filled 2023-10-25: qty 90, 30d supply, fill #0

## 2023-10-24 MED ORDER — TECHLITE PLUS PEN NEEDLES 32G X 4 MM MISC
1 refills | Status: DC
  Filled 2023-10-24 – 2023-10-31 (×2): qty 100, 90d supply, fill #0
  Filled 2024-01-19: qty 100, 90d supply, fill #1

## 2023-10-25 ENCOUNTER — Other Ambulatory Visit: Payer: Self-pay

## 2023-10-26 ENCOUNTER — Other Ambulatory Visit: Payer: Self-pay

## 2023-11-01 ENCOUNTER — Other Ambulatory Visit: Payer: Self-pay

## 2023-11-02 ENCOUNTER — Ambulatory Visit: Admitting: Dietician

## 2023-11-07 ENCOUNTER — Other Ambulatory Visit: Payer: Self-pay | Admitting: Family

## 2023-11-07 ENCOUNTER — Other Ambulatory Visit: Payer: Self-pay

## 2023-11-07 DIAGNOSIS — I10 Essential (primary) hypertension: Secondary | ICD-10-CM

## 2023-11-07 MED ORDER — AMLODIPINE BESYLATE 10 MG PO TABS
10.0000 mg | ORAL_TABLET | Freq: Every day | ORAL | 0 refills | Status: DC
  Filled 2023-11-08 – 2023-11-10 (×2): qty 90, 90d supply, fill #0

## 2023-11-08 ENCOUNTER — Other Ambulatory Visit: Payer: Self-pay | Admitting: Family

## 2023-11-08 ENCOUNTER — Other Ambulatory Visit: Payer: Self-pay | Admitting: Family Medicine

## 2023-11-08 ENCOUNTER — Other Ambulatory Visit: Payer: Self-pay

## 2023-11-08 DIAGNOSIS — I10 Essential (primary) hypertension: Secondary | ICD-10-CM

## 2023-11-08 MED ORDER — LISINOPRIL-HYDROCHLOROTHIAZIDE 20-25 MG PO TABS
1.0000 | ORAL_TABLET | Freq: Every day | ORAL | 0 refills | Status: DC
  Filled 2023-11-08: qty 90, 90d supply, fill #0

## 2023-11-08 NOTE — Telephone Encounter (Signed)
 Lisinopril -Hydrochlorothiazide  prescribed 11/08/2023.

## 2023-11-10 ENCOUNTER — Other Ambulatory Visit: Payer: Self-pay

## 2023-11-16 ENCOUNTER — Other Ambulatory Visit: Payer: Self-pay

## 2023-11-17 ENCOUNTER — Other Ambulatory Visit: Payer: Self-pay

## 2023-11-18 ENCOUNTER — Other Ambulatory Visit: Payer: Self-pay | Admitting: Family Medicine

## 2023-11-21 ENCOUNTER — Other Ambulatory Visit: Payer: Self-pay | Admitting: Family Medicine

## 2023-11-21 ENCOUNTER — Other Ambulatory Visit: Payer: Self-pay

## 2023-11-21 MED ORDER — LANTUS SOLOSTAR 100 UNIT/ML ~~LOC~~ SOPN
20.0000 [IU] | PEN_INJECTOR | Freq: Every day | SUBCUTANEOUS | 2 refills | Status: DC
  Filled 2023-11-22: qty 6, 30d supply, fill #0
  Filled 2023-12-20: qty 6, 30d supply, fill #1
  Filled 2024-01-19: qty 6, 30d supply, fill #2

## 2023-11-22 ENCOUNTER — Other Ambulatory Visit: Payer: Self-pay

## 2023-11-25 ENCOUNTER — Other Ambulatory Visit: Payer: Self-pay | Admitting: Family Medicine

## 2023-11-25 ENCOUNTER — Other Ambulatory Visit: Payer: Self-pay

## 2023-11-25 NOTE — Telephone Encounter (Signed)
 Copied from CRM [id number]. Topic: Clinical - Medication Refill >> Nov 25, 2023 10:32 AM Rosaria Common wrote: Medication: pregabalin  (LYRICA ) 200 MG capsule  Has the patient contacted their pharmacy? Yes (Agent: If no, request that the patient contact the pharmacy for the refill. If patient does not wish to contact the pharmacy document the reason why and proceed with request.) (Agent: If yes, when and what did the pharmacy advise?)  This is the patient's preferred pharmacy:  [facility] [address] [place] [address] Phone: [phone] Fax: [phone]      Is this the correct pharmacy for this prescription? Yes If no, delete pharmacy and type the correct one.   Has the prescription been filled recently? Yes  Is the patient out of the medication? Yes  Has the patient been seen for an appointment in the last year OR does the patient have an upcoming appointment? Yes  Can we respond through MyChart? No  Agent: Please be advised that Rx refills may take up to 3 business days. We ask that you follow-up with your pharmacy.

## 2023-11-25 NOTE — Telephone Encounter (Signed)
 Requested medication (s) are due for refill today: yes  Requested medication (s) are on the active medication list: yes  Last refill:  10/24/23 #90  Future visit scheduled: yes  Notes to clinic:  med not delegated to NT to RF   Requested Prescriptions  Pending Prescriptions Disp Refills   pregabalin  (LYRICA ) 200 MG capsule 90 capsule 0    Sig: Take 1 capsule (200 mg total) by mouth in the morning, at noon, and at bedtime.     Not Delegated - Neurology:  Anticonvulsants - Controlled - pregabalin  Failed - 11/25/2023  8:46 AM      Failed - This refill cannot be delegated      Passed - Cr in normal range and within 360 days    Creat  Date Value Ref Range Status  09/02/2014 0.90 0.50 - 1.35 mg/dL Final   Creatinine, Ser  Date Value Ref Range Status  03/02/2023 1.19 0.76 - 1.27 mg/dL Final         Passed - Completed PHQ-2 or PHQ-9 in the last 360 days      Passed - Valid encounter within last 12 months    Recent Outpatient Visits           2 months ago Diabetes mellitus type 2 in nonobese [facility])   [place] [facility], MD   3 months ago Eye irritation   [place] [facility], Amy J, NP   5 months ago Type 2 diabetes mellitus with diabetic neuropathy, without long-term current use of insulin  [facility])   [place] [facility], MD   6 months ago Other polyneuropathy   [place] [facility], Ray Caffey, MD   7 months ago Type 2 diabetes mellitus with diabetic neuropathy, without long-term current use of insulin  [facility])   [place] [facility], MD

## 2023-11-28 ENCOUNTER — Telehealth: Payer: Self-pay | Admitting: *Deleted

## 2023-11-28 ENCOUNTER — Other Ambulatory Visit: Payer: Self-pay | Admitting: Family Medicine

## 2023-11-28 ENCOUNTER — Other Ambulatory Visit: Payer: Self-pay

## 2023-11-28 ENCOUNTER — Ambulatory Visit: Payer: Self-pay

## 2023-11-28 MED ORDER — PREGABALIN 200 MG PO CAPS
200.0000 mg | ORAL_CAPSULE | Freq: Three times a day (TID) | ORAL | 0 refills | Status: DC
  Filled 2023-11-28: qty 90, 30d supply, fill #0

## 2023-11-28 MED ORDER — PREGABALIN 200 MG PO CAPS: 200.0000 mg | ORAL_CAPSULE | Freq: Three times a day (TID) | ORAL | 0 refills | Status: DC

## 2023-11-28 NOTE — Telephone Encounter (Signed)
 Copied from CRM [id number]. Topic: Clinical - Red Word Triage >> Nov 28, 2023  2:20 PM Elle L wrote: Red Word that prompted transfer to Nurse Triage: Ashley with [facility] called on behalf of the patient to advise that he is experiencing hardship and is out of work from not having the medication pregabalin  (LYRICA ) 200 MG capsule.   Reason for Disposition  Caller requesting a CONTROLLED substance prescription refill (e.g., narcotics, ADHD medicines)  Answer Assessment - Initial Assessment Questions 1. DRUG NAME: What medicine do you need to have refilled?     pregabalin  (LYRICA ) 200 MG capsule 2. REFILLS REMAINING: How many refills are remaining? (Note: The label on the medicine or pill bottle will show how many refills are remaining. If there are no refills remaining, then a renewal may be needed.)     0  Protocols used: Medication Refill and Renewal Call-A-AH

## 2023-11-28 NOTE — Telephone Encounter (Signed)
 pregabalin  (LYRICA ) 200 MG capsule

## 2023-11-29 ENCOUNTER — Other Ambulatory Visit: Payer: Self-pay

## 2023-11-30 ENCOUNTER — Other Ambulatory Visit: Payer: Self-pay

## 2023-12-05 ENCOUNTER — Other Ambulatory Visit: Payer: Self-pay

## 2023-12-05 ENCOUNTER — Other Ambulatory Visit (HOSPITAL_COMMUNITY): Payer: Self-pay

## 2023-12-05 ENCOUNTER — Encounter (HOSPITAL_COMMUNITY): Payer: Self-pay | Admitting: Emergency Medicine

## 2023-12-05 ENCOUNTER — Ambulatory Visit (HOSPITAL_COMMUNITY): Admission: EM | Admit: 2023-12-05 | Discharge: 2023-12-05 | Disposition: A | Discharge status: Home / Self Care

## 2023-12-05 DIAGNOSIS — H1033 Unspecified acute conjunctivitis, bilateral: Secondary | ICD-10-CM | POA: Diagnosis not present

## 2023-12-05 DIAGNOSIS — L243 Irritant contact dermatitis due to cosmetics: Secondary | ICD-10-CM

## 2023-12-05 MED ORDER — FEXOFENADINE HCL 180 MG PO TABS
180.0000 mg | ORAL_TABLET | Freq: Every day | ORAL | 1 refills | Status: AC
  Filled 2023-12-05: qty 60, 60d supply, fill #0
  Filled 2023-12-06: qty 30, 30d supply, fill #0
  Filled 2023-12-19 – 2023-12-22 (×2): qty 60, 60d supply, fill #0

## 2023-12-05 MED ORDER — PREDNISONE 20 MG PO TABS
40.0000 mg | ORAL_TABLET | Freq: Every day | ORAL | 0 refills | Status: AC
  Filled 2023-12-05 (×2): qty 10, 5d supply, fill #0

## 2023-12-05 MED ORDER — AZELASTINE HCL 0.05 % OP SOLN
1.0000 [drp] | Freq: Two times a day (BID) | OPHTHALMIC | 12 refills | Status: AC
  Filled 2023-12-05 – 2023-12-09 (×4): qty 6, 30d supply, fill #0

## 2023-12-05 MED ORDER — MOXIFLOXACIN HCL 0.5 % OP SOLN
1.0000 [drp] | Freq: Three times a day (TID) | OPHTHALMIC | 0 refills | Status: AC
  Filled 2023-12-05: qty 3, 20d supply, fill #0
  Filled 2023-12-05: qty 3, 10d supply, fill #0

## 2023-12-05 NOTE — ED Triage Notes (Signed)
 Pt reports that bought a new cologne and sprayed on arms and got a rash. Believes rubbed eyes without washing hands and having bilateral eye itching. Got drops over the counter that hasn't helped. Reports scabs in eyes(pointing to counter).

## 2023-12-05 NOTE — ED Provider Notes (Signed)
 [facility]  Note:  This document was prepared using Dragon voice recognition software and may include unintentional dictation errors.  MRN: [id number] DOB: 15-Mar-1966  Subjective:   Chad Wolfe is a 58 y.o. male presenting for allergic rash to bilateral arms and around his eyes after using a new cologne and rubbing his eyes without washing his hands.  Patient reports he has been using over-the-counter antihistamine drops with minimal to no improvement.  Patient reports the bilateral eyes are itching, swollen, with purulent mucus discharge.  Patient has not taken any over-the-counter antihistamine.  Patient denies any known exposure anyone positive for conjunctivitis.  No current facility-administered medications for this encounter.  Current Outpatient Medications:    azelastine  (OPTIVAR ) 0.05 % ophthalmic solution, Place 1 drop into both eyes 2 (two) times daily., Disp: 6 mL, Rfl: 12   fexofenadine (ALLEGRA) 180 MG tablet, Take 1 tablet (180 mg total) by mouth daily., Disp: 60 tablet, Rfl: 1   moxifloxacin  (VIGAMOX ) 0.5 % ophthalmic solution, Place 1 drop into both eyes 3 (three) times daily., Disp: 3 mL, Rfl: 0   predniSONE  (DELTASONE ) 20 MG tablet, Take 2 tablets (40 mg total) by mouth daily for 5 days., Disp: 10 tablet, Rfl: 0   amLODipine  (NORVASC ) 10 MG tablet, Take 1 tablet (10 mg total) by mouth daily., Disp: 90 tablet, Rfl: 0   amoxicillin -clavulanate (AUGMENTIN ) 875-125 MG tablet, Take 1 tablet by mouth every 12 (twelve) hours., Disp: 14 tablet, Rfl: 0   Continuous Glucose Receiver (FREESTYLE LIBRE 2 READER) DEVI, Use to check blood sugar continuously throughout the day., Disp: 1 each, Rfl: 0   Continuous Glucose Sensor (FREESTYLE LIBRE 2 SENSOR) MISC, Use to check blood sugar continuously throughout the day., Disp: 2 each, Rfl: 3   doxycycline  (VIBRA -TABS) 100 MG tablet, Take 1 tablet (100 mg total) by mouth 2 (two) times daily for 10 days., Disp: 20 tablet, Rfl:  0   erythromycin  ophthalmic ointment, Place a 1/2 inch ribbon of ointment into the lower  and upper eyelid TWO TIMES A DAY., Disp: 3.5 g, Rfl: 0   glucose blood (TRUE METRIX BLOOD GLUCOSE TEST) test strip, Use as instructed, Disp: 100 each, Rfl: 12   insulin  glargine (LANTUS  SOLOSTAR) 100 UNIT/ML Solostar Pen, Inject 20 Units into the skin daily., Disp: 6 mL, Rfl: 2   Insulin  Pen Needle (TECHLITE PLUS PEN NEEDLES) 32G X 4 MM MISC, Use to inject Lantus  once daily., Disp: 100 each, Rfl: 1   lisinopril  (ZESTRIL ) 10 MG tablet, Take 10 mg by mouth daily., Disp: , Rfl:    lisinopril -hydrochlorothiazide  (ZESTORETIC ) 20-25 MG tablet, Take 1 tablet by mouth daily., Disp: 90 tablet, Rfl: 0   pregabalin  (LYRICA ) 200 MG capsule, Take 1 capsule (200 mg total) by mouth in the morning, at noon, and at bedtime., Disp: 90 capsule, Rfl: 0   TRUEplus Lancets 28G MISC, use as directed, Disp: 100 each, Rfl: 12   Allergies  Allergen Reactions   Ibuprofen  Other (See Comments)    Stomach upset   Aspirin Other (See Comments)   Other Other (See Comments)    MD told him not to take [facility] due to Hep C   Tylenol  [Acetaminophen ] Other (See Comments)    HEP C    Past Medical History:  Diagnosis Date   Diabetes mellitus type 2 in nonobese (HCC) 01/21/2016   Gastric ulcer    Hepatitis C    Hyperlipidemia    Hypertension    Substance abuse (HCC)  Past Surgical History:  Procedure Laterality Date   I & D EXTREMITY Right 02/26/2017   Procedure: IRRIGATION AND DEBRIDEMENT AND REDUCTION OF RIGHT MIDDLE FINGER;  Surgeon: Camella Fallow, MD;  Location: [facility] OR;  Service: Orthopedics;  Laterality: Right;   STOMACH SURGERY      Family History  Problem Relation Age of Onset   Diabetes Father    Hypertension Father    Diabetes Sister    Hypertension Sister    Diabetes Brother    Hypertension Brother     Social History   Tobacco Use   Smoking status: Former    Types: Cigarettes    Passive exposure: Current    Smokeless tobacco: Never  Vaping Use   Vaping status: Never Used  Substance Use Topics   Alcohol use: Not Currently   Drug use: Not Currently    Types: Cocaine, Oxycodone     ROS Refer to HPI for ROS details.  Objective:   Vitals: BP (!) 82/63 (BP Location: Left Arm)   Pulse 81   Temp 97.7 F (36.5 C) (Oral)   Resp 17   SpO2 96%   Physical Exam Vitals and nursing note reviewed.  Constitutional:      General: He is not in acute distress.    Appearance: Normal appearance. He is well-developed. He is not ill-appearing or toxic-appearing.  HENT:     Head: Normocephalic.     Nose: Nose normal.     Mouth/Throat:     Mouth: Mucous membranes are moist.   Eyes:     General: Vision grossly intact. Gaze aligned appropriately. Allergic shiner present.        Right eye: Discharge present. No foreign body.        Left eye: Discharge present.No foreign body.     Extraocular Movements: Extraocular movements intact.     Right eye: Normal extraocular motion.     Left eye: Normal extraocular motion.     Conjunctiva/sclera:     Right eye: Right conjunctiva is injected. Exudate present.     Left eye: Left conjunctiva is injected. Exudate present.     Pupils: Pupils are equal, round, and reactive to light.    Cardiovascular:     Rate and Rhythm: Normal rate.  Pulmonary:     Effort: Pulmonary effort is normal. No respiratory distress.   Skin:    General: Skin is warm and dry.   Neurological:     General: No focal deficit present.     Mental Status: He is alert and oriented to person, place, and time.   Psychiatric:        Mood and Affect: Mood normal.        Behavior: Behavior normal.     Procedures  No results found for this or any previous visit (from the past 24 hours).  No results found.   Assessment and Plan :     Discharge Instructions       1. Irritant contact dermatitis due to cosmetics (Primary) - azelastine  (OPTIVAR ) 0.05 % ophthalmic solution; Place 1  drop into both eyes 2 (two) times daily for eye itching and redness - fexofenadine (ALLEGRA) 180 MG tablet; Take 1 tablet (180 mg total) by mouth daily for acute allergic reaction - predniSONE  (DELTASONE ) 20 MG tablet; Take 2 tablets (40 mg total) by mouth daily for 5 days for eye swelling secondary to allergic reaction  2. Acute conjunctivitis of both eyes, unspecified acute conjunctivitis type - moxifloxacin  (VIGAMOX ) 0.5 % ophthalmic solution;  Place 1 drop into both eyes 3 (three) times daily for possible bacterial conjunctivitis secondary to allergic reaction -Continue to monitor symptoms for any change in severity if there is any escalation of current symptoms or development of new symptoms follow-up in ER for further evaluation and management.      Jenia Klepper Wolfe Jesalyn Finazzo   Chad Wolfe, Chad Wolfe, Chad Wolfe 12/05/23 [phone]

## 2023-12-05 NOTE — Discharge Instructions (Addendum)
  1. Irritant contact dermatitis due to cosmetics (Primary) - azelastine  (OPTIVAR ) 0.05 % ophthalmic solution; Place 1 drop into both eyes 2 (two) times daily for eye itching and redness - fexofenadine (ALLEGRA) 180 MG tablet; Take 1 tablet (180 mg total) by mouth daily for acute allergic reaction - predniSONE  (DELTASONE ) 20 MG tablet; Take 2 tablets (40 mg total) by mouth daily for 5 days for eye swelling secondary to allergic reaction  2. Acute conjunctivitis of both eyes, unspecified acute conjunctivitis type - moxifloxacin  (VIGAMOX ) 0.5 % ophthalmic solution; Place 1 drop into both eyes 3 (three) times daily for possible bacterial conjunctivitis secondary to allergic reaction -Continue to monitor symptoms for any change in severity if there is any escalation of current symptoms or development of new symptoms follow-up in ER for further evaluation and management.

## 2023-12-06 ENCOUNTER — Telehealth: Payer: Self-pay

## 2023-12-06 ENCOUNTER — Other Ambulatory Visit: Payer: Self-pay

## 2023-12-06 ENCOUNTER — Ambulatory Visit: Payer: Self-pay

## 2023-12-06 ENCOUNTER — Other Ambulatory Visit (HOSPITAL_COMMUNITY): Payer: Self-pay

## 2023-12-06 NOTE — Telephone Encounter (Signed)
 Pharmacy Patient Advocate Encounter   Received notification from Pt Calls Messages that prior authorization for Azelastine  HCl 0.05% solution  is required/requested.   Insurance verification completed.   The patient is insured through [employer] .   Per test claim: PA required; PA submitted to above mentioned insurance via CoverMyMeds Key/confirmation #/EOC [id number] Status is pending

## 2023-12-06 NOTE — Telephone Encounter (Signed)
 Patient was called and will keep his regular schedule appt with pcp

## 2023-12-06 NOTE — Telephone Encounter (Signed)
 PA request has been Submitted. New Encounter has been or will be created for follow up. For additional info see Pharmacy Prior Auth telephone encounter from 12/06/23.

## 2023-12-06 NOTE — Telephone Encounter (Signed)
 FYI Only or Action Required?: Action required by provider: request for appointment and update on patient condition.  Patient was last seen in primary care on 09/16/2023 by Tanda Bleacher, MD. [facility] Nurse Triage reporting low blood pressure. Symptoms began yesterday. Interventions attempted: Other: reducing dosage of prescribed meds. Symptoms are: stable.  Triage Disposition: See PCP When Office is Open (Within 3 Days)  Patient/caregiver understands and will follow disposition?: Yes  Offered patient appointment for today, patient cannot come in due to childcare issues  Copied from CRM [id number]. Topic: Clinical - Medical Advice >> Dec 06, 2023  9:18 AM Wess RAMAN wrote: Patient would like to know if he should come in sooner for his hospital follow up which is on 7/11. He says he feels alright but his blood pressure was running low at urgent care.  BP Reading: 82/96  Callback #: [phone] Answer Assessment - Initial Assessment Questions 1. BLOOD PRESSURE: What is the blood pressure? Did you take at least two measurements 5 minutes apart?     Has not monitored blood pressure at home.  Patient had low blood pressure at UC one day ago  5. MEDICINES: Are you taking any medications for blood pressure? If Yes, ask: Have they been changed recently?     Patient states that he was instructed by UC to only take one of his BP medicines, the one with the lowest dose.  Patient states that he is following these instructions  7. OTHER SYMPTOMS: Have you been sick recently? Have you had a recent injury?     Denies all signs and symptoms of hypotension  Protocols used: Blood Pressure - Low-A-AH  Answer Assessment - Initial Assessment Questions 1. BLOOD PRESSURE: What is the blood pressure? Did you take at least two measurements 5 minutes apart?     Has not monitored blood pressure at home.  Patient had low blood pressure at UC one day ago 82/63, patient asymptomatic for  hypotension  5. MEDICINES: Are you taking any medications for blood pressure? If Yes, ask: Have they been changed recently?     Patient states that he was instructed by UC to only take one of his BP medicines, the one with the lowest dose.  Patient states that he is following these instructions  7. OTHER SYMPTOMS: Have you been sick recently? Have you had a recent injury?     Denies all signs and symptoms of hypotension  Protocols used: Blood Pressure - Low-A-AH  Reason for Disposition  Wants doctor to measure BP  Answer Assessment - Initial Assessment Questions 1. BLOOD PRESSURE: What is the blood pressure? Did you take at least two measurements 5 minutes apart?     Has not monitored blood pressure at home.  Patient had low blood pressure at UC one day ago 82/63, patient asymptomatic for hypotension  5. MEDICINES: Are you taking any medications for blood pressure? If Yes, ask: Have they been changed recently?     Patient states that he was instructed by UC to only take one of his BP medicines, the one with the lowest dose.  Patient states that he is following these instructions  7. OTHER SYMPTOMS: Have you been sick recently? Have you had a recent injury?     Denies all signs and symptoms of hypotension  Protocols used: Blood Pressure - Low-A-AH

## 2023-12-07 ENCOUNTER — Other Ambulatory Visit (HOSPITAL_COMMUNITY): Payer: Self-pay

## 2023-12-07 ENCOUNTER — Other Ambulatory Visit: Payer: Self-pay | Admitting: Family Medicine

## 2023-12-07 ENCOUNTER — Other Ambulatory Visit: Payer: Self-pay

## 2023-12-07 DIAGNOSIS — L243 Irritant contact dermatitis due to cosmetics: Secondary | ICD-10-CM

## 2023-12-07 NOTE — Telephone Encounter (Signed)
 Pharmacy Patient Advocate Encounter  Received notification from [facility] that Prior Authorization for Azelastine  HCl 0.05% solution  has been DENIED.  No reason given; No denial letter received via Fax or CMM. It has been requested and will be uploaded to the media tab once received.   PA #/Case ID/Reference #: [id number]

## 2023-12-12 ENCOUNTER — Other Ambulatory Visit: Payer: Self-pay

## 2023-12-13 ENCOUNTER — Other Ambulatory Visit: Payer: Self-pay

## 2023-12-14 ENCOUNTER — Other Ambulatory Visit: Payer: Self-pay

## 2023-12-15 ENCOUNTER — Other Ambulatory Visit: Payer: Self-pay

## 2023-12-16 ENCOUNTER — Encounter: Payer: Self-pay | Admitting: Family Medicine

## 2023-12-16 ENCOUNTER — Ambulatory Visit (INDEPENDENT_AMBULATORY_CARE_PROVIDER_SITE_OTHER): Payer: Self-pay | Admitting: Family Medicine

## 2023-12-16 ENCOUNTER — Other Ambulatory Visit: Payer: Self-pay

## 2023-12-16 VITALS — BP 115/74 | HR 74 | Ht 69.0 in | Wt 183.4 lb

## 2023-12-16 DIAGNOSIS — I10 Essential (primary) hypertension: Secondary | ICD-10-CM | POA: Diagnosis not present

## 2023-12-16 DIAGNOSIS — Z794 Long term (current) use of insulin: Secondary | ICD-10-CM | POA: Diagnosis not present

## 2023-12-16 DIAGNOSIS — E1169 Type 2 diabetes mellitus with other specified complication: Secondary | ICD-10-CM | POA: Diagnosis not present

## 2023-12-16 LAB — POCT GLYCOSYLATED HEMOGLOBIN (HGB A1C): HbA1c, POC (controlled diabetic range): 7.5 % — AB (ref 0.0–7.0)

## 2023-12-16 NOTE — Progress Notes (Unsigned)
 Established Patient Office Visit  Subjective    Patient ID: Chad Wolfe, male    DOB: 03-27-1966  Age: 58 y.o. MRN: [id number]  CC:  Chief Complaint  Patient presents with  . Medical Management of Chronic Issues    Discoloration in eyes    HPI Chad Wolfe presents for routien  Outpatient Encounter Medications as of 12/16/2023  Medication Sig  . amLODipine  (NORVASC ) 10 MG tablet Take 1 tablet (10 mg total) by mouth daily.  . azelastine  (OPTIVAR ) 0.05 % ophthalmic solution Place 1 drop into both eyes 2 (two) times daily.  . Continuous Glucose Receiver (FREESTYLE LIBRE 2 READER) DEVI Use to check blood sugar continuously throughout the day.  . Continuous Glucose Sensor (FREESTYLE LIBRE 2 SENSOR) MISC Use to check blood sugar continuously throughout the day.  . doxycycline  (VIBRA -TABS) 100 MG tablet Take 1 tablet (100 mg total) by mouth 2 (two) times daily for 10 days.  . erythromycin  ophthalmic ointment Place a 1/2 inch ribbon of ointment into the lower  and upper eyelid TWO TIMES A DAY.  . fexofenadine  (ALLEGRA ) 180 MG tablet Take 1 tablet (180 mg total) by mouth daily.  SABRA glucose blood (TRUE METRIX BLOOD GLUCOSE TEST) test strip Use as instructed  . insulin  glargine (LANTUS  SOLOSTAR) 100 UNIT/ML Solostar Pen Inject 20 Units into the skin daily.  . Insulin  Pen Needle (TECHLITE PLUS PEN NEEDLES) 32G X 4 MM MISC Use to inject Lantus  once daily.  . lisinopril  (ZESTRIL ) 10 MG tablet Take 10 mg by mouth daily.  . moxifloxacin  (VIGAMOX ) 0.5 % ophthalmic solution Place 1 drop into both eyes 3 (three) times daily.  . pregabalin  (LYRICA ) 200 MG capsule Take 1 capsule (200 mg total) by mouth in the morning, at noon, and at bedtime.  . TRUEplus Lancets 28G MISC use as directed  . amoxicillin -clavulanate (AUGMENTIN ) 875-125 MG tablet Take 1 tablet by mouth every 12 (twelve) hours. (Patient not taking: Reported on 12/16/2023)  . lisinopril -hydrochlorothiazide  (ZESTORETIC ) 20-25 MG tablet  Take 1 tablet by mouth daily. (Patient not taking: Reported on 12/16/2023)   No facility-administered encounter medications on file as of 12/16/2023.    Past Medical History:  Diagnosis Date  . Diabetes mellitus type 2 in nonobese (HCC) 01/21/2016  . Gastric ulcer   . Hepatitis C   . Hyperlipidemia   . Hypertension   . Substance abuse [facility])     Past Surgical History:  Procedure Laterality Date  . I & D EXTREMITY Right 02/26/2017   Procedure: IRRIGATION AND DEBRIDEMENT AND REDUCTION OF RIGHT MIDDLE FINGER;  Surgeon: Camella Elsie, MD;  Location: [facility] OR;  Service: Orthopedics;  Laterality: Right;  . STOMACH SURGERY      Family History  Problem Relation Age of Onset  . Diabetes Father   . Hypertension Father   . Diabetes Sister   . Hypertension Sister   . Diabetes Brother   . Hypertension Brother     Social History   Socioeconomic History  . Marital status: Divorced    Spouse name: Not on file  . Number of children: Not on file  . Years of education: Not on file  . Highest education level: Not on file  Occupational History  . Not on file  Tobacco Use  . Smoking status: Former    Types: Cigarettes    Passive exposure: Current  . Smokeless tobacco: Never  Vaping Use  . Vaping status: Never Used  Substance and Sexual Activity  . Alcohol use:  Not Currently  . Drug use: Not Currently    Types: Cocaine, Oxycodone   . Sexual activity: Not Currently    Comment: opiates  Other Topics Concern  . Not on file  Social History Narrative  . Not on file   Social Drivers of Health   Financial Resource Strain: Low Risk  (03/15/2023)   Overall Financial Resource Strain (CARDIA)   . Difficulty of Paying Living Expenses: Not hard at all  Food Insecurity: Low Risk  (09/09/2022)   Received from [facility]   Hunger Vital Sign   . Within the past 12 months, you worried that your food would run out before you got money to buy more: Never true   . Within the past 12 months, the  food you bought just didn't last and you didn't have money to get more. : Never true  Transportation Needs: No Transportation Needs (09/09/2022)   Received from [facility]   . In the past 12 months, has lack of reliable transportation kept you from medical appointments, meetings, work or from getting things needed for daily living? : No  Physical Activity: Sufficiently Active (03/15/2023)   Exercise Vital Sign   . Days of Exercise per Week: 7 days   . Minutes of Exercise per Session: 60 min  Stress: No Stress Concern Present (03/15/2023)   [employer] of Occupational Health - Occupational Stress Questionnaire   . Feeling of Stress : Not at all  Social Connections: Moderately Isolated (03/15/2023)   Social Connection and Isolation Panel   . Frequency of Communication with Friends and Family: More than three times a week   . Frequency of Social Gatherings with Friends and Family: More than three times a week   . Attends Religious Services: More than 4 times per year   . Active Member of Clubs or Organizations: No   . Attends Banker Meetings: Never   . Marital Status: Divorced  Catering manager Violence: Not on file    ROS      Objective    BP 115/74   Pulse 74   Ht 5' 9 (1.753 m)   Wt 183 lb 6.4 oz (83.2 kg)   SpO2 93%   BMI 27.08 kg/m   Physical Exam  {Labs (Optional):23779}    Assessment & Plan:   Type 2 diabetes mellitus with other specified complication, without long-term current use of insulin  (HCC) -     POCT glycosylated hemoglobin (Hb A1C)     No follow-ups on file.   Tanda Raguel SQUIBB, MD

## 2023-12-19 ENCOUNTER — Other Ambulatory Visit: Payer: Self-pay

## 2023-12-20 ENCOUNTER — Other Ambulatory Visit: Payer: Self-pay

## 2023-12-21 ENCOUNTER — Other Ambulatory Visit: Payer: Self-pay

## 2023-12-21 ENCOUNTER — Encounter: Payer: Self-pay | Admitting: Family Medicine

## 2023-12-22 ENCOUNTER — Other Ambulatory Visit: Payer: Self-pay

## 2023-12-23 ENCOUNTER — Other Ambulatory Visit: Payer: Self-pay | Admitting: Family Medicine

## 2023-12-26 ENCOUNTER — Other Ambulatory Visit: Payer: Self-pay | Admitting: Family Medicine

## 2023-12-26 ENCOUNTER — Other Ambulatory Visit: Payer: Self-pay

## 2023-12-29 ENCOUNTER — Other Ambulatory Visit: Payer: Self-pay | Admitting: Family Medicine

## 2023-12-29 ENCOUNTER — Other Ambulatory Visit: Payer: Self-pay

## 2023-12-30 ENCOUNTER — Other Ambulatory Visit: Payer: Self-pay

## 2023-12-30 MED ORDER — PREGABALIN 200 MG PO CAPS
200.0000 mg | ORAL_CAPSULE | Freq: Three times a day (TID) | ORAL | 0 refills | Status: DC
  Filled 2023-12-30 (×2): qty 90, 30d supply, fill #0

## 2024-01-19 ENCOUNTER — Other Ambulatory Visit: Payer: Self-pay

## 2024-01-23 ENCOUNTER — Other Ambulatory Visit: Payer: Self-pay | Admitting: Family Medicine

## 2024-01-25 ENCOUNTER — Other Ambulatory Visit: Payer: Self-pay

## 2024-01-27 ENCOUNTER — Other Ambulatory Visit: Payer: Self-pay

## 2024-01-27 ENCOUNTER — Telehealth: Payer: Self-pay | Admitting: Family Medicine

## 2024-01-27 MED ORDER — PREGABALIN 200 MG PO CAPS
200.0000 mg | ORAL_CAPSULE | Freq: Three times a day (TID) | ORAL | 0 refills | Status: DC
  Filled 2024-01-31 (×2): qty 90, 30d supply, fill #0

## 2024-01-27 NOTE — Telephone Encounter (Unsigned)
 Copied from CRM [id number]. Topic: Clinical - Medication Refill >> Jan 27, 2024  9:09 AM Selinda RAMAN wrote: Medication: pregabalin  (LYRICA ) 200 MG capsule  Has the patient contacted their pharmacy? Yes   This is the patient's preferred pharmacy:  [facility] [address], [address] [place] [address] Phone: [phone] Fax: [phone]   Is this the correct pharmacy for this prescription? Yes If no, delete pharmacy and type the correct one.   Has the prescription been filled recently? No  Is the patient out of the medication? No he has a few days left  Has the patient been seen for an appointment in the last year OR does the patient have an upcoming appointment? Yes  Can we respond through MyChart? No  Please assist patient further

## 2024-01-27 NOTE — Telephone Encounter (Signed)
 Pt calling for refill, pending encounter 08/18

## 2024-01-31 ENCOUNTER — Other Ambulatory Visit: Payer: Self-pay

## 2024-02-01 ENCOUNTER — Other Ambulatory Visit: Payer: Self-pay

## 2024-02-08 ENCOUNTER — Other Ambulatory Visit: Payer: Self-pay | Admitting: Family Medicine

## 2024-02-08 DIAGNOSIS — I10 Essential (primary) hypertension: Secondary | ICD-10-CM

## 2024-02-09 ENCOUNTER — Other Ambulatory Visit: Payer: Self-pay

## 2024-02-09 MED ORDER — AMLODIPINE BESYLATE 10 MG PO TABS
10.0000 mg | ORAL_TABLET | Freq: Every day | ORAL | 0 refills | Status: DC
  Filled 2024-02-13: qty 90, 90d supply, fill #0

## 2024-02-13 ENCOUNTER — Other Ambulatory Visit: Payer: Self-pay

## 2024-02-13 ENCOUNTER — Other Ambulatory Visit: Payer: Self-pay | Admitting: Family Medicine

## 2024-02-13 ENCOUNTER — Telehealth: Payer: Self-pay | Admitting: Family Medicine

## 2024-02-13 DIAGNOSIS — I10 Essential (primary) hypertension: Secondary | ICD-10-CM

## 2024-02-13 MED ORDER — LANTUS SOLOSTAR 100 UNIT/ML ~~LOC~~ SOPN
20.0000 [IU] | PEN_INJECTOR | Freq: Every day | SUBCUTANEOUS | 2 refills | Status: DC
  Filled 2024-02-16: qty 6, 30d supply, fill #0
  Filled 2024-03-15: qty 6, 30d supply, fill #1
  Filled 2024-04-16: qty 6, 30d supply, fill #2

## 2024-02-13 NOTE — Telephone Encounter (Signed)
 Pt is at pharmacy and stated the pharmacy has not received lisinopril  refill. Pt is at the pharmacy now and needs it today because he ran out and does not have another ride to the pharmacy.

## 2024-02-14 ENCOUNTER — Other Ambulatory Visit: Payer: Self-pay | Admitting: Family Medicine

## 2024-02-14 ENCOUNTER — Other Ambulatory Visit: Payer: Self-pay

## 2024-02-14 MED ORDER — LISINOPRIL 10 MG PO TABS
10.0000 mg | ORAL_TABLET | Freq: Every day | ORAL | 1 refills | Status: DC
  Filled 2024-02-14: qty 30, 30d supply, fill #0

## 2024-02-15 ENCOUNTER — Other Ambulatory Visit (HOSPITAL_COMMUNITY): Payer: Self-pay

## 2024-02-15 ENCOUNTER — Other Ambulatory Visit: Payer: Self-pay

## 2024-02-15 ENCOUNTER — Telehealth: Payer: Self-pay

## 2024-02-15 MED ORDER — LISINOPRIL-HYDROCHLOROTHIAZIDE 20-25 MG PO TABS
1.0000 | ORAL_TABLET | Freq: Every day | ORAL | 0 refills | Status: DC
  Filled 2024-02-15: qty 90, 90d supply, fill #0

## 2024-02-15 NOTE — Telephone Encounter (Signed)
 Tried to call pt for clarification because he called in yesterday asking for lisinopril  refill... No answer, LVM to call back.

## 2024-02-15 NOTE — Telephone Encounter (Signed)
 Copied from CRM [id number]. Topic: Clinical - Medication Question >> Feb 15, 2024  2:37 PM [name] wrote: Reason for CRM: lisinopril -hydrochlorothiazide  (ZESTORETIC ) 20-25 MG tablet- patient does not know why this medication was called in for him. It was not discussed. Please call [phone]

## 2024-02-16 ENCOUNTER — Other Ambulatory Visit: Payer: Self-pay

## 2024-02-17 ENCOUNTER — Other Ambulatory Visit: Payer: Self-pay

## 2024-02-24 ENCOUNTER — Other Ambulatory Visit: Payer: Self-pay | Admitting: Family Medicine

## 2024-02-27 ENCOUNTER — Other Ambulatory Visit: Payer: Self-pay

## 2024-02-28 ENCOUNTER — Other Ambulatory Visit: Payer: Self-pay

## 2024-02-28 ENCOUNTER — Ambulatory Visit (HOSPITAL_COMMUNITY)
Admission: EM | Admit: 2024-02-28 | Discharge: 2024-02-28 | Disposition: A | Discharge status: Home / Self Care | Attending: Internal Medicine | Admitting: Internal Medicine

## 2024-02-28 ENCOUNTER — Encounter (HOSPITAL_COMMUNITY): Payer: Self-pay

## 2024-02-28 ENCOUNTER — Other Ambulatory Visit: Payer: Self-pay | Admitting: Family Medicine

## 2024-02-28 DIAGNOSIS — H6123 Impacted cerumen, bilateral: Secondary | ICD-10-CM | POA: Diagnosis not present

## 2024-02-28 DIAGNOSIS — K0889 Other specified disorders of teeth and supporting structures: Secondary | ICD-10-CM

## 2024-02-28 MED ORDER — AMOXICILLIN-POT CLAVULANATE 875-125 MG PO TABS
1.0000 | ORAL_TABLET | Freq: Two times a day (BID) | ORAL | 0 refills | Status: DC
  Filled 2024-02-28: qty 14, 7d supply, fill #0

## 2024-02-28 NOTE — Telephone Encounter (Unsigned)
 Copied from CRM #[id number]. Topic: Clinical - Medication Refill >> Feb 28, 2024  8:19 AM [name] wrote: Medication: pregabalin  (LYRICA ) 200 MG capsule  Has the patient contacted their pharmacy? Yes (Agent: If no, request that the patient contact the pharmacy for the refill. If patient does not wish to contact the pharmacy document the reason why and proceed with request.) (Agent: If yes, when and what did the pharmacy advise?)  This is the patient's preferred pharmacy:  [facility] [address] [place] [address] Phone: [phone] Fax: [phone]  Is this the correct pharmacy for this prescription? Yes If no, delete pharmacy and type the correct one.   Has the prescription been filled recently? Yes  Is the patient out of the medication? No  Has the patient been seen for an appointment in the last year OR does the patient have an upcoming appointment? Yes  Can we respond through MyChart? no  Agent: Please be advised that Rx refills may take up to 3 business days. We ask that you follow-up with your pharmacy.

## 2024-02-28 NOTE — ED Provider Notes (Signed)
 [facility]    CSN: [id number] Arrival date & time: 02/28/24  9076      History   Chief Complaint Chief Complaint  Patient presents with   Otalgia    HPI Chad Wolfe is a 58 y.o. male.   Chad Wolfe is a 58 y.o. male presenting for chief complaint of right ear pain and dental pain to the widespread upper mouth and central lower mouth. He has diffuse dental decay to the location of his pain and is scheduled to have dental extraction April 12, 2024. Pain is worse with cold exposure. He has been able to eat and drink without difficulty or nausea/vomiting. Denies recent trama or injuries to the mouth, fever/chills, difficulty swallowing, decreased ROM of the jaw, facial swelling, and neck pain. Reports pain to the mouth radiates to the right ear. Hearing is normal, denies drainage from the ears and tinnitus/dizziness. Taking ibuprofen  for dental pain with minimal relief. Last had antibiotics in April 2025 (Augmentin ).    Otalgia   Past Medical History:  Diagnosis Date   Diabetes mellitus type 2 in nonobese (HCC) 01/21/2016   Gastric ulcer    Hepatitis C    Hyperlipidemia    Hypertension    Substance abuse [facility])     Patient Active Problem List   Diagnosis Date Noted   Cheek laceration 09/06/2022   Maxillary fracture (HCC) 09/06/2022   Uninsured 02/04/2021   Transaminitis 02/04/2021   BMI 22.0-22.9, adult 01/15/2021   DKA (diabetic ketoacidosis) (HCC) 01/05/2021   DKA, type 2 (HCC) 01/04/2021   Open finger dislocation 02/26/2017   Diabetes mellitus type 2 in nonobese (HCC) 01/21/2016   Fall from chair 05/15/2015   Alcohol abuse 05/05/2015   Pain, dental 03/27/2013   HTN (hypertension) 04/25/2012   Chronic hepatitis C virus infection (HCC) 11/15/2007   ABDOMINAL PAIN-EPIGASTRIC 11/15/2007    Past Surgical History:  Procedure Laterality Date   I & D EXTREMITY Right 02/26/2017   Procedure: IRRIGATION AND DEBRIDEMENT AND REDUCTION OF RIGHT MIDDLE  FINGER;  Surgeon: Camella Elsie, MD;  Location: [facility];  Service: Orthopedics;  Laterality: Right;   STOMACH SURGERY         Home Medications    Prior to Admission medications   Medication Sig Start Date End Date Taking? Authorizing Provider  amoxicillin -clavulanate (AUGMENTIN ) 875-125 MG tablet Take 1 tablet by mouth every 12 (twelve) hours. 02/28/24  Yes Enedelia Dorna HERO, FNP  amLODipine  (NORVASC ) 10 MG tablet Take 1 tablet (10 mg total) by mouth daily. 02/09/24   Tanda Bleacher, MD  azelastine  (OPTIVAR ) 0.05 % ophthalmic solution Place 1 drop into both eyes 2 (two) times daily. 12/05/23   Reddick, Ethel B, NP  Continuous Glucose Receiver (FREESTYLE LIBRE 2 READER) DEVI Use to check blood sugar continuously throughout the day. 12/06/22   Newlin, Enobong, MD  Continuous Glucose Sensor (FREESTYLE LIBRE 2 SENSOR) MISC Use to check blood sugar continuously throughout the day. 12/06/22   Newlin, Enobong, MD  doxycycline  (VIBRA -TABS) 100 MG tablet Take 1 tablet (100 mg total) by mouth 2 (two) times daily for 10 days. 08/16/23 12/16/23  Lorren Greig PARAS, NP  erythromycin  ophthalmic ointment Place a 1/2 inch ribbon of ointment into the lower  and upper eyelid TWO TIMES A DAY. 08/23/23   Rodriguez-Southworth, Kyra, PA-C  fexofenadine  (ALLEGRA ) 180 MG tablet Take 1 tablet (180 mg total) by mouth daily. 12/05/23   Reddick, Johnathan B, NP  glucose blood (TRUE METRIX BLOOD GLUCOSE TEST) test strip  Use as instructed 01/15/21   [name], [name], PA-C  insulin  glargine (LANTUS  SOLOSTAR) 100 UNIT/ML Solostar Pen Inject 20 Units into the skin daily. 02/13/24   Tanda Bleacher, MD  Insulin  Pen Needle (TECHLITE PLUS PEN NEEDLES) 32G X 4 MM MISC Use to inject Lantus  once daily. 10/24/23   Tanda Bleacher, MD  lisinopril -hydrochlorothiazide  (ZESTORETIC ) 20-25 MG tablet Take 1 tablet by mouth daily. 02/15/24   Tanda Bleacher, MD  moxifloxacin  (VIGAMOX ) 0.5 % ophthalmic solution Place 1 drop into both eyes 3 (three) times  daily. 12/05/23   Reddick, Johnathan B, NP  pregabalin  (LYRICA ) 200 MG capsule Take 1 capsule (200 mg total) by mouth in the morning, at noon, and at bedtime. 01/27/24   Tanda Bleacher, MD  TRUEplus Lancets 28G MISC use as directed 01/15/21   [name], Kirk RAMAN, PA-C  lisinopril  (ZESTRIL ) 10 MG tablet Take 1 tablet (10 mg total) by mouth daily. 02/14/24 02/15/24  Tanda Bleacher, MD    Family History Family History  Problem Relation Age of Onset   Diabetes Father    Hypertension Father    Diabetes Sister    Hypertension Sister    Diabetes Brother    Hypertension Brother     Social History Social History   Tobacco Use   Smoking status: Former    Types: Cigarettes    Passive exposure: Current   Smokeless tobacco: Never  Vaping Use   Vaping status: Never Used  Substance Use Topics   Alcohol use: Not Currently   Drug use: Not Currently    Types: Cocaine, Oxycodone      Allergies   Ibuprofen , Aspirin, Other, and Tylenol  [acetaminophen ]   Review of Systems Review of Systems  HENT:  Positive for ear pain.   Per HPI   Physical Exam Triage Vital Signs ED Triage Vitals  Encounter Vitals Group     BP 02/28/24 0937 101/75     Girls Systolic BP Percentile --      Girls Diastolic BP Percentile --      Boys Systolic BP Percentile --      Boys Diastolic BP Percentile --      Pulse Rate 02/28/24 0935 75     Resp 02/28/24 0935 16     Temp 02/28/24 0935 (!) 97.5 F (36.4 C)     Temp Source 02/28/24 0935 Oral     SpO2 02/28/24 0935 95 %     Weight --      Height --      Head Circumference --      Peak Flow --      Pain Score 02/28/24 0936 8     Pain Loc --      Pain Education --      Exclude from Growth Chart --    No data found.  Updated Vital Signs BP 101/75   Pulse 75   Temp (!) 97.5 F (36.4 C) (Oral)   Resp 16   SpO2 95%   Visual Acuity Right Eye Distance:   Left Eye Distance:   Bilateral Distance:    Right Eye Near:   Left Eye Near:    Bilateral Near:      Physical Exam Vitals and nursing note reviewed.  Constitutional:      Appearance: He is not ill-appearing or toxic-appearing.  HENT:     Head: Normocephalic and atraumatic.     Jaw: There is normal jaw occlusion.     Right Ear: Hearing, ear canal and external ear normal. There  is impacted cerumen.     Left Ear: Hearing, ear canal and external ear normal. There is impacted cerumen.     Nose: Nose normal.     Mouth/Throat:     Lips: Pink.     Mouth: Mucous membranes are moist. No injury or oral lesions.     Dentition: Abnormal dentition. Does not have dentures. Dental tenderness, gingival swelling and dental caries present. No dental abscesses.     Tongue: No lesions.     Pharynx: Oropharynx is clear. Uvula midline. No pharyngeal swelling, oropharyngeal exudate, posterior oropharyngeal erythema, uvula swelling or postnasal drip.     Tonsils: No tonsillar exudate.     Comments: Diffuse dental decay with multiple missing teeth to the upper mouth and lower mouth.  No palpable or visualized dental abscess.  Very tender to palpation over the upper gingiva without erythema/swelling. Eyes:     General: Lids are normal. Vision grossly intact. Gaze aligned appropriately.     Extraocular Movements: Extraocular movements intact.     Conjunctiva/sclera: Conjunctivae normal.  Neck:     Trachea: Trachea and phonation normal.  Pulmonary:     Effort: Pulmonary effort is normal.  Musculoskeletal:     Cervical back: Neck supple.  Lymphadenopathy:     Cervical: Cervical adenopathy present.  Skin:    General: Skin is warm and dry.     Capillary Refill: Capillary refill takes less than 2 seconds.     Findings: No rash.  Neurological:     General: No focal deficit present.     Mental Status: He is alert and oriented to person, place, and time. Mental status is at baseline.     Cranial Nerves: No dysarthria or facial asymmetry.  Psychiatric:        Mood and Affect: Mood normal.        Speech:  Speech normal.        Behavior: Behavior normal.        Thought Content: Thought content normal.        Judgment: Judgment normal.      UC Treatments / Results  Labs (all labs ordered are listed, but only abnormal results are displayed) Labs Reviewed - No data to display  EKG   Radiology No results found.  Procedures Procedures (including critical care time)  Medications Ordered in UC Medications - No data to display  Initial Impression / Assessment and Plan / UC Course  I have reviewed the triage vital signs and the nursing notes.  Pertinent labs & imaging results that were available during my care of the patient were reviewed by me and considered in my medical decision making (see chart for details).   1.  Dental pain Evaluation suggests dental pain secondary to dental infection.  HEENT exam stable and without red flag signs indicating need for advanced imaging/further emergent workup to rule out deep soft tissue space infection etc.   Antibiotic ordered to treat infection to the mouth.  Recommend supportive care for symptomatic relief as outlined in AVS.   Advised to follow-up with dentist as scheduled on April 12, 2024 for dental extraction.  2.  Impacted cerumen bilateral Both ear(s) cleaned with ear lavage to remove ear wax impactions bilaterally by nursing staff.  Reassessment shows normal tympanic membrane(s) without signs of AOM/AOE.  Patient may use debrox ear drops at home as needed for wax removal and has been advised to avoid using Q-tips.   Counseled patient on potential for adverse effects with medications  prescribed/recommended today, strict ER and return-to-clinic precautions discussed, patient verbalized understanding.    Final Clinical Impressions(s) / UC Diagnoses   Final diagnoses:  Pain, dental  Bilateral impacted cerumen     Discharge Instructions      Your dental pain is likely due to dental infection. Take  antibiotic as prescribed  for the next 7 days to treat your dental infection. Continue use of over the counter medications as needed with food for dental inflammation and pain like ibuprofen  600mg  every 6 hours.  Perform salt water gargles every 3-4 hours.  Follow-up with dentist for tooth extractions as scheduled in November.   If you develop any new or worsening symptoms or if your symptoms do not start to improve, pleases return here or follow-up with your primary care provider. If your symptoms are severe, please go to the emergency room.      ED Prescriptions     Medication Sig Dispense Auth. Provider   amoxicillin -clavulanate (AUGMENTIN ) 875-125 MG tablet Take 1 tablet by mouth every 12 (twelve) hours. 14 tablet Enedelia Dorna HERO, FNP      PDMP not reviewed this encounter.   Enedelia Dorna HERO, [place] 02/28/24 1020

## 2024-02-28 NOTE — Discharge Instructions (Signed)
 Your dental pain is likely due to dental infection. Take  antibiotic as prescribed for the next 7 days to treat your dental infection. Continue use of over the counter medications as needed with food for dental inflammation and pain like ibuprofen  600mg  every 6 hours.  Perform salt water gargles every 3-4 hours.  Follow-up with dentist for tooth extractions as scheduled in November.   If you develop any new or worsening symptoms or if your symptoms do not start to improve, pleases return here or follow-up with your primary care provider. If your symptoms are severe, please go to the emergency room.

## 2024-02-28 NOTE — ED Triage Notes (Signed)
 Pt states right ear pain and dental pain for the past 3 days.  States he has a an appointment to get his teeth fixed in November. States he had been taking Motrin  at home with no relief.

## 2024-02-29 NOTE — Telephone Encounter (Signed)
 Requested medication (s) are due for refill today: yes  Requested medication (s) are on the active medication list: yes  Last refill:  01/27/24  Future visit scheduled: {Yes  Notes to clinic:  Unable to refill per protocol, cannot delegate.      Requested Prescriptions  Pending Prescriptions Disp Refills   pregabalin  (LYRICA ) 200 MG capsule 90 capsule 0    Sig: Take 1 capsule (200 mg total) by mouth in the morning, at noon, and at bedtime.     Not Delegated - Neurology:  Anticonvulsants - Controlled - pregabalin  Failed - 02/29/2024  8:13 AM      Failed - This refill cannot be delegated      Failed - Cr in normal range and within 360 days    Creat  Date Value Ref Range Status  09/02/2014 0.90 0.50 - 1.35 mg/dL Final   Creatinine, Ser  Date Value Ref Range Status  03/02/2023 1.19 0.76 - 1.27 mg/dL Final         Passed - Completed PHQ-2 or PHQ-9 in the last 360 days      Passed - Valid encounter within last 12 months    Recent Outpatient Visits           2 months ago Type 2 diabetes mellitus with other specified complication, without long-term current use of insulin  (HCC)   [facility], Raguel, MD   5 months ago Diabetes mellitus type 2 in nonobese [facility])   [facility], MD   6 months ago Eye irritation   [facility], Amy J, NP   8 months ago Type 2 diabetes mellitus with diabetic neuropathy, without long-term current use of insulin  [facility])   [facility], MD   9 months ago Other polyneuropathy   [facility], MD

## 2024-03-02 ENCOUNTER — Other Ambulatory Visit: Payer: Self-pay

## 2024-03-02 ENCOUNTER — Other Ambulatory Visit (HOSPITAL_COMMUNITY): Payer: Self-pay

## 2024-03-02 MED ORDER — PREGABALIN 200 MG PO CAPS
200.0000 mg | ORAL_CAPSULE | Freq: Three times a day (TID) | ORAL | 0 refills | Status: DC
  Filled 2024-03-02: qty 90, 30d supply, fill #0

## 2024-03-15 ENCOUNTER — Other Ambulatory Visit: Payer: Self-pay

## 2024-03-16 ENCOUNTER — Other Ambulatory Visit: Payer: Self-pay

## 2024-03-19 ENCOUNTER — Encounter: Payer: Self-pay | Admitting: Family Medicine

## 2024-03-19 ENCOUNTER — Ambulatory Visit: Admitting: Family Medicine

## 2024-03-19 VITALS — BP 113/76 | HR 81 | Ht 69.0 in | Wt 179.2 lb

## 2024-03-19 DIAGNOSIS — Z794 Long term (current) use of insulin: Secondary | ICD-10-CM

## 2024-03-19 DIAGNOSIS — I10 Essential (primary) hypertension: Secondary | ICD-10-CM | POA: Diagnosis not present

## 2024-03-19 DIAGNOSIS — E1169 Type 2 diabetes mellitus with other specified complication: Secondary | ICD-10-CM | POA: Diagnosis not present

## 2024-03-19 LAB — POCT GLYCOSYLATED HEMOGLOBIN (HGB A1C): HbA1c, POC (controlled diabetic range): 6.4 % (ref 0.0–7.0)

## 2024-03-19 NOTE — Progress Notes (Unsigned)
 Established Patient Office Visit  Subjective    Patient ID: Chad Wolfe, male    DOB: [birth year]  Age: 58 y.o. MRN: [id number]  CC:  Chief Complaint  Patient presents with   Medical Management of Chronic Issues    HPI Chad Wolfe presents for routine follow up of chronic med issues including diabetes and hypertension. Patient reports that he would like to file for disability because his health is declining while he is trying to raise an 10 yo son.   Outpatient Encounter Medications as of 03/19/2024  Medication Sig   amLODipine  (NORVASC ) 10 MG tablet Take 1 tablet (10 mg total) by mouth daily.   Continuous Glucose Receiver (FREESTYLE LIBRE 2 READER) DEVI Use to check blood sugar continuously throughout the day.   Continuous Glucose Sensor (FREESTYLE LIBRE 2 SENSOR) MISC Use to check blood sugar continuously throughout the day.   fexofenadine  (ALLEGRA ) 180 MG tablet Take 1 tablet (180 mg total) by mouth daily.   glucose blood (TRUE METRIX BLOOD GLUCOSE TEST) test strip Use as instructed   insulin  glargine (LANTUS  SOLOSTAR) 100 UNIT/ML Solostar Pen Inject 20 Units into the skin daily.   Insulin  Pen Needle (TECHLITE PLUS PEN NEEDLES) 32G X 4 MM MISC Use to inject Lantus  once daily.   lisinopril -hydrochlorothiazide  (ZESTORETIC ) 20-25 MG tablet Take 1 tablet by mouth daily.   pregabalin  (LYRICA ) 200 MG capsule Take 1 capsule (200 mg total) by mouth in the morning, at noon, and at bedtime.   TRUEplus Lancets 28G MISC use as directed   amoxicillin -clavulanate (AUGMENTIN ) 875-125 MG tablet Take 1 tablet by mouth every 12 (twelve) hours. (Patient not taking: Reported on 03/19/2024)   azelastine  (OPTIVAR ) 0.05 % ophthalmic solution Place 1 drop into both eyes 2 (two) times daily.   doxycycline  (VIBRA -TABS) 100 MG tablet Take 1 tablet (100 mg total) by mouth 2 (two) times daily for 10 days.   erythromycin  ophthalmic ointment Place a 1/2 inch ribbon of ointment into the lower  and upper  eyelid TWO TIMES A DAY.   moxifloxacin  (VIGAMOX ) 0.5 % ophthalmic solution Place 1 drop into both eyes 3 (three) times daily.   [DISCONTINUED] lisinopril  (ZESTRIL ) 10 MG tablet Take 1 tablet (10 mg total) by mouth daily.   No facility-administered encounter medications on file as of 03/19/2024.    Past Medical History:  Diagnosis Date   Diabetes mellitus type 2 in nonobese (HCC) 01/21/2016   Gastric ulcer    Hepatitis C    Hyperlipidemia    Hypertension    Substance abuse (HCC)     Past Surgical History:  Procedure Laterality Date   I & D EXTREMITY Right 02/26/2017   Procedure: IRRIGATION AND DEBRIDEMENT AND REDUCTION OF RIGHT MIDDLE FINGER;  Surgeon: Camella Elsie, MD;  Location: [facility];  Service: Orthopedics;  Laterality: Right;   STOMACH SURGERY      Family History  Problem Relation Age of Onset   Diabetes Father    Hypertension Father    Diabetes Sister    Hypertension Sister    Diabetes Brother    Hypertension Brother     Social History   Socioeconomic History   Marital status: Divorced    Spouse name: Not on file   Number of children: Not on file   Years of education: Not on file   Highest education level: Not on file  Occupational History   Not on file  Tobacco Use   Smoking status: Former    Types: Cigarettes  Passive exposure: Current   Smokeless tobacco: Never  Vaping Use   Vaping status: Never Used  Substance and Sexual Activity   Alcohol use: Not Currently   Drug use: Not Currently    Types: Cocaine, Oxycodone    Sexual activity: Not Currently    Comment: opiates  Other Topics Concern   Not on file  Social History Narrative   Not on file   Social Drivers of Health   Financial Resource Strain: Low Risk  (03/15/2023)   Overall Financial Resource Strain (CARDIA)    Difficulty of Paying Living Expenses: Not hard at all  Food Insecurity: Low Risk  (09/09/2022)   Received from [facility]   Hunger Vital Sign    Within the past 12 months, you  worried that your food would run out before you got money to buy more: Never true    Within the past 12 months, the food you bought just didn't last and you didn't have money to get more. : Never true  Transportation Needs: No Transportation Needs (09/09/2022)   Received from [facility]    In the past 12 months, has lack of reliable transportation kept you from medical appointments, meetings, work or from getting things needed for daily living? : No  Physical Activity: Sufficiently Active (03/15/2023)   Exercise Vital Sign    Days of Exercise per Week: 7 days    Minutes of Exercise per Session: 60 min  Stress: No Stress Concern Present (03/15/2023)   [employer] of Occupational Health - Occupational Stress Questionnaire    Feeling of Stress : Not at all  Social Connections: Moderately Isolated (03/15/2023)   Social Connection and Isolation Panel    Frequency of Communication with Friends and Family: More than three times a week    Frequency of Social Gatherings with Friends and Family: More than three times a week    Attends Religious Services: More than 4 times per year    Active Member of [employer] or Organizations: No    Attends Banker Meetings: Never    Marital Status: Divorced  Catering manager Violence: Not on file    Review of Systems  All other systems reviewed and are negative.       Objective    BP 113/76   Pulse 81   Ht 5' 9 (1.753 m)   Wt 179 lb 3.2 oz (81.3 kg)   SpO2 94%   BMI 26.46 kg/m   Physical Exam Vitals and nursing note reviewed.  Constitutional:      General: He is not in acute distress. Cardiovascular:     Rate and Rhythm: Normal rate and regular rhythm.  Pulmonary:     Effort: Pulmonary effort is normal.  Abdominal:     General: There is no distension.     Palpations: Abdomen is soft.  Neurological:     General: No focal deficit present.     Mental Status: He is alert and oriented to person, place, and time.          Assessment & Plan:   Type 2 diabetes mellitus with other specified complication, without long-term current use of insulin  (HCC) -     POCT glycosylated hemoglobin (Hb A1C)  Essential hypertension     No follow-ups on file.   Tanda Raguel SQUIBB, MD

## 2024-03-22 ENCOUNTER — Telehealth: Payer: Self-pay | Admitting: *Deleted

## 2024-03-22 NOTE — Progress Notes (Signed)
 Complex Care Management Note  Care Guide Note 03/22/2024 Name: Chad Wolfe MRN: [id number] DOB: [birth year]  Chad Wolfe is a 58 y.o. year old male who sees Tanda Bleacher, MD for primary care. I reached out to [name] by phone today to offer complex care management services.  [name] was given information about Complex Care Management services today including:   The Complex Care Management services include support from the care team which includes your Nurse Care Manager, Clinical Social Worker, or Pharmacist.  The Complex Care Management team is here to help remove barriers to the health concerns and goals most important to you. Complex Care Management services are voluntary, and the patient may decline or stop services at any time by request to their care team member.   Complex Care Management Consent Status: Patient agreed to services and verbal consent obtained.   Follow up plan:  Telephone appointment with complex care management team member scheduled for:  03/23/24  Encounter Outcome:  Patient Scheduled  Harlene Satterfield  [facility]  [facility], [facility] Guide  Direct Dial: [phone]  Fax [phone]

## 2024-03-23 ENCOUNTER — Other Ambulatory Visit: Payer: Self-pay | Admitting: Licensed Clinical Social Worker

## 2024-03-23 ENCOUNTER — Telehealth: Payer: Self-pay

## 2024-03-23 NOTE — Patient Instructions (Signed)
 Visit Information  Thank you for taking time to visit with me today. Please don't hesitate to contact me if I can be of assistance to you before our next scheduled appointment.  Your next care management appointment is {NEXTVISITTYPE:[id number]} on *** at ***  Closing From: Complex Care Management.  Please call the care guide team at [phone] if you need to cancel, schedule, or reschedule an appointment.   Please call the Suicide and Crisis Lifeline: 988 call the [place]  National Suicide Prevention Lifeline: (708)771-2596 or TTY: 501-112-9955 TTY 747-588-6496) to talk to a trained counselor call 1-800-273-TALK (toll free, 24 hour hotline) if you are experiencing a Mental Health or Behavioral Health Crisis or need someone to talk to.  SIGNATURE***

## 2024-03-23 NOTE — Patient Outreach (Signed)
 LCSW called and spoke to patient on the phone. LCSW introduced self and explained role. Patient stated that he is no longer able to work and needs and income and wanted assistance applying for social security disability. LCSW informed patient that we are not able to assist with applying for SSDI. LCSW encouraged patient to reach out to the social security office directly to start the process. LCSW informed patient the application can be filled out in person, online or over the phone. LCSW gave patient this number [phone] to call to begin the process with social security administration. LCSW explained that  representative will inform him of the process and needed supportive documentation. Patient stated he will call today. LCSW explained other services provided, and patient stated he is not depressed and declines speaking with a therapist for mental health.   Cena Ligas, LCSW Clinical Social Worker [facility]

## 2024-03-23 NOTE — Telephone Encounter (Signed)
 Copied from CRM #[id number]. Topic: Referral - Question >> Mar 23, 2024 11:12 AM Ivette P wrote: Reason for CRM: pt called in because referral called him saying they could not perform the type of referral requested. Pt would like to get a phone call regarding what his next steps are for disability    Pls follow up with pt

## 2024-03-23 NOTE — Telephone Encounter (Signed)
 Patient is needing disability paperwork filled out. He said he had discussed this with you at his last OV. Please advise if pt will need to come in for an OV for this

## 2024-03-26 ENCOUNTER — Other Ambulatory Visit: Payer: Self-pay | Admitting: Family Medicine

## 2024-03-27 ENCOUNTER — Other Ambulatory Visit: Payer: Self-pay | Admitting: Family Medicine

## 2024-03-27 ENCOUNTER — Other Ambulatory Visit: Payer: Self-pay

## 2024-03-27 NOTE — Telephone Encounter (Unsigned)
 Copied from CRM #[id number]. Topic: Clinical - Medication Question >> Mar 27, 2024  9:48 AM Donee H wrote: Reason for CRM: Patient called regarding pregabalin  (LYRICA ) 200 MG capsule. He states he can't get medication until Oct. 26 but normal pharmacy he uses will be closed. There was a refill for medication place yesterday.Patient states he is not completely out of medication but only have enough to last until Oct. 26. He is wanting to know if medication can actually be sent to a different pharmacy just this one time.   Preferred pharmacy   [facility] [address] [place] [address] Phone: [phone] Fax: [phone] Hours: Mon-Fri 7:30a-7p; Sat 8a-4:30p; [place] 10a-2p  Patient requesting for someone to called him regarding the request to confirm if it can be sent to pharmacy. He does not have Mychart [phone]

## 2024-03-28 ENCOUNTER — Other Ambulatory Visit: Payer: Self-pay

## 2024-03-28 ENCOUNTER — Telehealth: Payer: Self-pay

## 2024-03-28 NOTE — Telephone Encounter (Signed)
 Copied from CRM #[id number]. Topic: Clinical - Medication Question >> Mar 27, 2024  9:48 AM Donee H wrote: Reason for CRM: Patient called regarding pregabalin  (LYRICA ) 200 MG capsule. He states he can't get medication until Oct. 26 but normal pharmacy he uses will be closed. There was a refill for medication place yesterday.Patient states he is not completely out of medication but only have enough to last until Oct. 26. He is wanting to know if medication can actually be sent to a different pharmacy just this one time.   Preferred pharmacy   [place] [facility] [address] [place] [address] Phone: [phone] Fax: [phone] Hours: Mon-Fri 7:30a-7p; Sat 8a-4:30p; [place] 10a-2p  Patient requesting for someone to called him regarding the request to confirm if it can be sent to pharmacy. He does not have Mychart [phone] >> Mar 28, 2024  9:39 AM Ivette P wrote: Pt called in about medication and wants an update. Pls follow up with pt.

## 2024-03-29 ENCOUNTER — Other Ambulatory Visit: Payer: Self-pay

## 2024-03-29 NOTE — Telephone Encounter (Signed)
 Requested medication (s) are due for refill today: na   Requested medication (s) are on the active medication list: yes   Last refill:  03/02/24 #90 00 refills  Future visit scheduled: yes 09/17/24  Notes to clinic:  not delegated per protocol. Patient requesting refill sent to [facility] pharmacy this one time due to other pharmacy hours not open on 04/01/24 when refill can be picked up. Patient requesting call back #[phone] .     Requested Prescriptions  Pending Prescriptions Disp Refills   pregabalin  (LYRICA ) 200 MG capsule 90 capsule 0    Sig: Take 1 capsule (200 mg total) by mouth in the morning, at noon, and at bedtime.     Not Delegated - Neurology:  Anticonvulsants - Controlled - pregabalin  Failed - 03/29/2024 10:37 AM      Failed - This refill cannot be delegated      Failed - Cr in normal range and within 360 days    Creat  Date Value Ref Range Status  09/02/2014 0.90 0.50 - 1.35 mg/dL Final   Creatinine, Ser  Date Value Ref Range Status  03/02/2023 1.19 0.76 - 1.27 mg/dL Final         Passed - Completed PHQ-2 or PHQ-9 in the last 360 days      Passed - Valid encounter within last 12 months    Recent Outpatient Visits           1 week ago Type 2 diabetes mellitus with other specified complication, without long-term current use of insulin  (HCC)   [place] [facility], Raguel, MD   3 months ago Type 2 diabetes mellitus with other specified complication, without long-term current use of insulin  [facility])   [place] [facility], Raguel, MD   6 months ago Diabetes mellitus type 2 in nonobese [facility])   [place] [facility], MD   7 months ago Eye irritation   [facility], Amy J, NP   9 months ago Type 2 diabetes mellitus with diabetic neuropathy, without long-term current use of insulin  [facility])   [place] [facility], MD

## 2024-03-29 NOTE — Telephone Encounter (Signed)
 Spoke to pt and he agrees to just wait until Monday to pick up meds at his regular pharmacy

## 2024-03-30 ENCOUNTER — Other Ambulatory Visit (HOSPITAL_COMMUNITY): Payer: Self-pay

## 2024-03-30 MED ORDER — PREGABALIN 200 MG PO CAPS
200.0000 mg | ORAL_CAPSULE | Freq: Three times a day (TID) | ORAL | 0 refills | Status: DC
  Filled 2024-03-30: qty 90, 30d supply, fill #0

## 2024-04-02 ENCOUNTER — Other Ambulatory Visit: Payer: Self-pay

## 2024-04-10 ENCOUNTER — Telehealth: Payer: Self-pay | Admitting: Family Medicine

## 2024-04-10 NOTE — Telephone Encounter (Signed)
 Called pt and let him know that he needs to sign a release form to send out medical records. Pt agreed

## 2024-04-10 NOTE — Telephone Encounter (Signed)
 Copied from CRM [id number]. Topic: Medical Record Request - Other >> Apr 09, 2024 10:24 AM Leonette SQUIBB wrote: Reason for CRM: patient called saying he needs his medical records faxed to social security for disability.  Please notify patient when the papers are being faxed  [phone]  [phone]

## 2024-04-16 ENCOUNTER — Other Ambulatory Visit: Payer: Self-pay

## 2024-04-16 ENCOUNTER — Other Ambulatory Visit: Payer: Self-pay | Admitting: Family Medicine

## 2024-04-17 ENCOUNTER — Other Ambulatory Visit: Payer: Self-pay

## 2024-04-17 ENCOUNTER — Other Ambulatory Visit: Payer: Self-pay | Admitting: Family Medicine

## 2024-04-17 MED ORDER — TECHLITE PEN NEEDLES 32G X 4 MM MISC
2 refills | Status: AC
  Filled 2024-04-17: qty 100, 90d supply, fill #0
  Filled 2024-07-05: qty 100, 90d supply, fill #1

## 2024-04-17 NOTE — Telephone Encounter (Signed)
 Requested Prescriptions  Pending Prescriptions Disp Refills   Insulin  Pen Needle (TECHLITE PEN NEEDLES) 32G X 4 MM MISC 100 each 1    Sig: Use to inject Lantus  once daily.     Endocrinology: Diabetes - Testing Supplies Passed - 04/17/2024  4:17 PM      Passed - Valid encounter within last 12 months    Recent Outpatient Visits           4 weeks ago Type 2 diabetes mellitus with other specified complication, without long-term current use of insulin  (HCC)   [place] [facility], Raguel, MD   4 months ago Type 2 diabetes mellitus with other specified complication, without long-term current use of insulin  [facility])   [place] [facility], MD   7 months ago Diabetes mellitus type 2 in nonobese [facility])   [place] [facility], MD   8 months ago Eye irritation   [facility], [place] J, NP   10 months ago Type 2 diabetes mellitus with diabetic neuropathy, without long-term current use of insulin  [facility])   [place] [facility], MD

## 2024-04-17 NOTE — Telephone Encounter (Signed)
 Copied from CRM [id number]. Topic: Medical Record Request - Records Request >> Apr 16, 2024 11:36 AM Lonell PEDLAR wrote: Reason for CRM: patient would like an update on records that he was needing to be faxed for disability forms. Correct fax number: [phone] >> Apr 17, 2024 10:04 AM Donna BRAVO wrote: Patient calling to verify fax number for Social Security office fax [phone]  to send medical records for disability forms.

## 2024-04-17 NOTE — Telephone Encounter (Signed)
 Val gave me the records release signed by pt on 04/11/24.  I have faxed the last 2 OV notes to DSS per patient's request to the fax number provided by the patient.   I called and informed the pt.

## 2024-04-17 NOTE — Telephone Encounter (Signed)
 Noted

## 2024-04-18 ENCOUNTER — Other Ambulatory Visit: Payer: Self-pay

## 2024-04-19 ENCOUNTER — Encounter (HOSPITAL_COMMUNITY): Payer: Self-pay | Admitting: *Deleted

## 2024-04-19 ENCOUNTER — Ambulatory Visit (HOSPITAL_COMMUNITY)
Admission: EM | Admit: 2024-04-19 | Discharge: 2024-04-19 | Disposition: A | Discharge status: Home / Self Care | Attending: Emergency Medicine | Admitting: Emergency Medicine

## 2024-04-19 ENCOUNTER — Other Ambulatory Visit: Payer: Self-pay

## 2024-04-19 DIAGNOSIS — H109 Unspecified conjunctivitis: Secondary | ICD-10-CM

## 2024-04-19 DIAGNOSIS — B9689 Other specified bacterial agents as the cause of diseases classified elsewhere: Secondary | ICD-10-CM | POA: Diagnosis not present

## 2024-04-19 MED ORDER — POLYMYXIN B-TRIMETHOPRIM 10000-0.1 UNIT/ML-% OP SOLN
1.0000 [drp] | OPHTHALMIC | 0 refills | Status: AC
  Filled 2024-04-19: qty 10, 17d supply, fill #0

## 2024-04-19 NOTE — Discharge Instructions (Signed)
 Use the Polytrim eyedrops, 1 drop to both eyes every 4 hours for the next 7 days.  Refrain from itching or scratching at the eyes as this may spread the infection.  You can use cool compresses or ice to the eyes.  To clean out any discharge use tissues or a clean wash rag each time.  Wash your hands before and after applying the drops to your eyes.  Symptoms should improve over the next few days, if no improvement or any changes he can return to clinic.  For any vision changes seek immediate care with an ophthalmologist.

## 2024-04-19 NOTE — ED Provider Notes (Signed)
 [facility]    CSN: [id number] Arrival date & time: 04/19/24  0900      History   Chief Complaint Chief Complaint  Patient presents with   Eye Drainage    HPI Chad Wolfe is a 58 y.o. male.   Patient presents to clinic over concern of bilateral eye redness, itching and crusting discharge.  Noticed this started yesterday.  Today his son noticed that his eyes were very red.  Has been using expired antihistamine eyedrops without much improvement.  Feels like his eyelids are swollen today as well.  Has not had any vision changes or pain.  Denies injury to the eye.  Does not wear contacts.  Did wear glasses, until he lost his glasses almost 2 years ago.  The history is provided by the patient and medical records.    Past Medical History:  Diagnosis Date   Diabetes mellitus type 2 in nonobese (HCC) 01/21/2016   Gastric ulcer    Hepatitis C    Hyperlipidemia    Hypertension    Substance abuse [facility])     Patient Active Problem List   Diagnosis Date Noted   Cheek laceration 09/06/2022   Maxillary fracture (HCC) 09/06/2022   Uninsured 02/04/2021   Transaminitis 02/04/2021   BMI 22.0-22.9, adult 01/15/2021   DKA (diabetic ketoacidosis) (HCC) 01/05/2021   DKA, type 2 (HCC) 01/04/2021   Open finger dislocation 02/26/2017   Diabetes mellitus type 2 in nonobese (HCC) 01/21/2016   Fall from chair 05/15/2015   Alcohol abuse 05/05/2015   Pain, dental 03/27/2013   HTN (hypertension) 04/25/2012   Chronic hepatitis C virus infection (HCC) 11/15/2007   ABDOMINAL PAIN-EPIGASTRIC 11/15/2007    Past Surgical History:  Procedure Laterality Date   I & D EXTREMITY Right 02/26/2017   Procedure: IRRIGATION AND DEBRIDEMENT AND REDUCTION OF RIGHT MIDDLE FINGER;  Surgeon: Camella Elsie, MD;  Location: [facility] OR;  Service: Orthopedics;  Laterality: Right;   STOMACH SURGERY         Home Medications    Prior to Admission medications   Medication Sig Start Date End Date  Taking? Authorizing Provider  amLODipine  (NORVASC ) 10 MG tablet Take 1 tablet (10 mg total) by mouth daily. 02/09/24  Yes Tanda Bleacher, MD  Continuous Glucose Receiver (FREESTYLE LIBRE 2 READER) DEVI Use to check blood sugar continuously throughout the day. 12/06/22  Yes Newlin, Enobong, MD  Continuous Glucose Sensor (FREESTYLE LIBRE 2 SENSOR) MISC Use to check blood sugar continuously throughout the day. 12/06/22  Yes Newlin, Enobong, MD  erythromycin  ophthalmic ointment Place a 1/2 inch ribbon of ointment into the lower  and upper eyelid TWO TIMES A DAY. 08/23/23  Yes Rodriguez-Southworth, Kyra, PA-C  glucose blood (TRUE METRIX BLOOD GLUCOSE TEST) test strip Use as instructed 01/15/21  Yes [name], [name], PA-C  insulin  glargine (LANTUS  SOLOSTAR) 100 UNIT/ML Solostar Pen Inject 20 Units into the skin daily. 02/13/24  Yes Tanda Bleacher, MD  Insulin  Pen Needle (TECHLITE PEN NEEDLES) 32G X 4 MM MISC Use to inject Lantus  once daily. 04/17/24  Yes Tanda Bleacher, MD  lisinopril -hydrochlorothiazide  (ZESTORETIC ) 20-25 MG tablet Take 1 tablet by mouth daily. 02/15/24  Yes Tanda Bleacher, MD  pregabalin  (LYRICA ) 200 MG capsule Take 1 capsule (200 mg total) by mouth in the morning, at noon, and at bedtime. 03/30/24  Yes Tanda Bleacher, MD  trimethoprim -polymyxin b (POLYTRIM) ophthalmic solution Place 1 drop into both eyes every 4 (four) hours for 7 days. 04/19/24 05/23/24 Yes Herschel Fleagle  N,  FNP  TRUEplus Lancets 28G MISC use as directed 01/15/21  Yes [name], [name], PA-C  amoxicillin -clavulanate (AUGMENTIN ) 875-125 MG tablet Take 1 tablet by mouth every 12 (twelve) hours. Patient not taking: No sig reported 02/28/24   Enedelia Dorna HERO, FNP  azelastine  (OPTIVAR ) 0.05 % ophthalmic solution Place 1 drop into both eyes 2 (two) times daily. 12/05/23   Reddick, Johnathan B, NP  doxycycline  (VIBRA -TABS) 100 MG tablet Take 1 tablet (100 mg total) by mouth 2 (two) times daily for 10 days. 08/16/23 12/16/23  Jaycee Greig PARAS, NP  fexofenadine  (ALLEGRA ) 180 MG tablet Take 1 tablet (180 mg total) by mouth daily. 12/05/23   Reddick, Johnathan B, NP  moxifloxacin  (VIGAMOX ) 0.5 % ophthalmic solution Place 1 drop into both eyes 3 (three) times daily. 12/05/23   Reddick, Johnathan B, NP  lisinopril  (ZESTRIL ) 10 MG tablet Take 1 tablet (10 mg total) by mouth daily. 02/14/24 02/15/24  Tanda Bleacher, MD    Family History Family History  Problem Relation Age of Onset   Diabetes Father    Hypertension Father    Diabetes Sister    Hypertension Sister    Diabetes Brother    Hypertension Brother     Social History Social History   Tobacco Use   Smoking status: Former    Types: Cigarettes    Passive exposure: Current   Smokeless tobacco: Never  Vaping Use   Vaping status: Never Used  Substance Use Topics   Alcohol use: Not Currently   Drug use: Not Currently    Types: Cocaine, Oxycodone      Allergies   Ibuprofen , Aspirin, Other, and Tylenol  [acetaminophen ]   Review of Systems Review of Systems  Per HPI  Physical Exam Triage Vital Signs ED Triage Vitals  Encounter Vitals Group     BP 04/19/24 0926 102/69     Girls Systolic BP Percentile --      Girls Diastolic BP Percentile --      Boys Systolic BP Percentile --      Boys Diastolic BP Percentile --      Pulse Rate 04/19/24 0926 81     Resp 04/19/24 0926 18     Temp 04/19/24 0926 97.9 F (36.6 C)     Temp src --      SpO2 04/19/24 0926 93 %     Weight --      Height --      Head Circumference --      Peak Flow --      Pain Score 04/19/24 0923 6     Pain Loc --      Pain Education --      Exclude from Growth Chart --    No data found.  Updated Vital Signs BP 102/69   Pulse 81   Temp 97.9 F (36.6 C)   Resp 18   SpO2 93%   Visual Acuity Right Eye Distance:   Left Eye Distance:   Bilateral Distance:    Right Eye Near:   Left Eye Near:    Bilateral Near:     Physical Exam Vitals and nursing note reviewed.  Constitutional:       Appearance: Normal appearance.  HENT:     Head: Normocephalic and atraumatic.     Right Ear: External ear normal.     Left Ear: External ear normal.     Nose: Nose normal.     Mouth/Throat:     Mouth: Mucous membranes are moist.  Eyes:  General: Vision grossly intact.     Conjunctiva/sclera:     Right eye: Right conjunctiva is injected.     Left eye: Left conjunctiva is injected.     Pupils: Pupils are equal, round, and reactive to light.     Comments: Bilateral upper and lower blepharitis  Crusting yellow drainage on both eyelashes, left worse than right  Conjunctive is injected  Cardiovascular:     Rate and Rhythm: Normal rate.  Pulmonary:     Effort: Pulmonary effort is normal. No respiratory distress.  Neurological:     General: No focal deficit present.     Mental Status: He is alert and oriented to person, place, and time.  Psychiatric:        Mood and Affect: Mood normal.        Behavior: Behavior normal. Behavior is cooperative.      UC Treatments / Results  Labs (all labs ordered are listed, but only abnormal results are displayed) Labs Reviewed - No data to display  EKG   Radiology No results found.  Procedures Procedures (including critical care time)  Medications Ordered in UC Medications - No data to display  Initial Impression / Assessment and Plan / UC Course  I have reviewed the triage vital signs and the nursing notes.  Pertinent labs & imaging results that were available during my care of the patient were reviewed by me and considered in my medical decision making (see chart for details).  Vitals and triage reviewed, patient is hemodynamically stable.  Denies vision changes, PERRLA.  Atraumatic.  Bilateral injected conjunctive up with crusting yellow drainage consistent with bacterial conjunctivitis.  Bilateral blepharitis as well.  Will treat with Polytrim, does not wear contacts.  Plan of care, follow-up care return precautions  given, no questions at this time.    Final Clinical Impressions(s) / UC Diagnoses   Final diagnoses:  Bacterial conjunctivitis of both eyes     Discharge Instructions      Use the Polytrim eyedrops, 1 drop to both eyes every 4 hours for the next 7 days.  Refrain from itching or scratching at the eyes as this may spread the infection.  You can use cool compresses or ice to the eyes.  To clean out any discharge use tissues or a clean wash rag each time.  Wash your hands before and after applying the drops to your eyes.  Symptoms should improve over the next few days, if no improvement or any changes he can return to clinic.  For any vision changes seek immediate care with an ophthalmologist.     ED Prescriptions     Medication Sig Dispense Auth. Provider   trimethoprim -polymyxin b (POLYTRIM) ophthalmic solution Place 1 drop into both eyes every 4 (four) hours for 7 days. 10 mL Dreama, Rashika Bettes  N, FNP      PDMP not reviewed this encounter.   Dreama, Brysen Shankman  N, FNP 04/19/24 1004

## 2024-04-19 NOTE — ED Triage Notes (Signed)
 PT reports he has bil eye drainage and eye redness. Pt has been using expired Lastacaft eye drops the patient denies injury to eye.

## 2024-04-25 ENCOUNTER — Other Ambulatory Visit: Payer: Self-pay

## 2024-04-25 ENCOUNTER — Other Ambulatory Visit: Payer: Self-pay | Admitting: Family Medicine

## 2024-04-30 ENCOUNTER — Other Ambulatory Visit (HOSPITAL_COMMUNITY): Payer: Self-pay

## 2024-04-30 ENCOUNTER — Other Ambulatory Visit: Payer: Self-pay

## 2024-04-30 ENCOUNTER — Other Ambulatory Visit: Payer: Self-pay | Admitting: Family Medicine

## 2024-05-02 ENCOUNTER — Other Ambulatory Visit (HOSPITAL_COMMUNITY): Payer: Self-pay

## 2024-05-02 ENCOUNTER — Other Ambulatory Visit (HOSPITAL_BASED_OUTPATIENT_CLINIC_OR_DEPARTMENT_OTHER): Payer: Self-pay

## 2024-05-02 ENCOUNTER — Other Ambulatory Visit: Payer: Self-pay

## 2024-05-02 MED ORDER — PREGABALIN 200 MG PO CAPS
200.0000 mg | ORAL_CAPSULE | Freq: Three times a day (TID) | ORAL | 0 refills | Status: DC
  Filled 2024-05-02 – 2024-05-04 (×2): qty 90, 30d supply, fill #0

## 2024-05-04 ENCOUNTER — Other Ambulatory Visit (HOSPITAL_COMMUNITY): Payer: Self-pay

## 2024-05-04 ENCOUNTER — Other Ambulatory Visit: Payer: Self-pay

## 2024-05-08 ENCOUNTER — Other Ambulatory Visit: Payer: Self-pay

## 2024-05-08 ENCOUNTER — Other Ambulatory Visit: Payer: Self-pay | Admitting: Family Medicine

## 2024-05-08 DIAGNOSIS — I10 Essential (primary) hypertension: Secondary | ICD-10-CM

## 2024-05-08 MED ORDER — LISINOPRIL-HYDROCHLOROTHIAZIDE 20-25 MG PO TABS
1.0000 | ORAL_TABLET | Freq: Every day | ORAL | 0 refills | Status: AC
  Filled 2024-05-08: qty 90, 90d supply, fill #0

## 2024-05-08 MED ORDER — AMLODIPINE BESYLATE 10 MG PO TABS
10.0000 mg | ORAL_TABLET | Freq: Every day | ORAL | 0 refills | Status: AC
  Filled 2024-05-11: qty 90, 90d supply, fill #0

## 2024-05-11 ENCOUNTER — Other Ambulatory Visit: Payer: Self-pay

## 2024-05-11 ENCOUNTER — Other Ambulatory Visit: Payer: Self-pay | Admitting: Family Medicine

## 2024-05-11 MED ORDER — LANTUS SOLOSTAR 100 UNIT/ML ~~LOC~~ SOPN
20.0000 [IU] | PEN_INJECTOR | Freq: Every day | SUBCUTANEOUS | 2 refills | Status: AC
  Filled 2024-05-16: qty 6, 30d supply, fill #0
  Filled 2024-06-01 – 2024-06-08 (×4): qty 6, 30d supply, fill #1
  Filled 2024-07-05: qty 6, 30d supply, fill #2
  Filled ????-??-??: fill #1

## 2024-05-16 ENCOUNTER — Other Ambulatory Visit: Payer: Self-pay

## 2024-05-17 ENCOUNTER — Other Ambulatory Visit: Payer: Self-pay

## 2024-05-28 ENCOUNTER — Other Ambulatory Visit: Payer: Self-pay | Admitting: Family Medicine

## 2024-05-28 ENCOUNTER — Other Ambulatory Visit: Payer: Self-pay

## 2024-05-28 ENCOUNTER — Encounter (HOSPITAL_COMMUNITY): Payer: Self-pay | Admitting: *Deleted

## 2024-05-28 ENCOUNTER — Ambulatory Visit (HOSPITAL_COMMUNITY)
Admission: EM | Admit: 2024-05-28 | Discharge: 2024-05-28 | Disposition: A | Discharge status: Home / Self Care | Attending: Physician Assistant | Admitting: Physician Assistant

## 2024-05-28 DIAGNOSIS — K047 Periapical abscess without sinus: Secondary | ICD-10-CM

## 2024-05-28 DIAGNOSIS — K0889 Other specified disorders of teeth and supporting structures: Secondary | ICD-10-CM

## 2024-05-28 MED ORDER — AMOXICILLIN-POT CLAVULANATE 875-125 MG PO TABS
1.0000 | ORAL_TABLET | Freq: Two times a day (BID) | ORAL | 0 refills | Status: AC
  Filled 2024-05-28: qty 20, 10d supply, fill #0

## 2024-05-28 NOTE — Discharge Instructions (Signed)
 Start Augmentin  twice daily for 10 days. Gargle with warm salt water for additional symptom relief.  You should follow-up with dentist; call to schedule an appointment.  If you develop any worsening symptoms including difficulty swallowing, difficulty speaking, swelling of your throat, high fever, change in your voice you need to be seen immediately.

## 2024-05-28 NOTE — ED Provider Notes (Signed)
 " [facility]    CSN: [id number] Arrival date & time: 05/28/24  1023      History   Chief Complaint Chief Complaint  Patient presents with   Dental Pain    HPI Chad Wolfe is a 59 y.o. male.   Patient presents today with a 4-day history of right lower dental pain and swelling.  He reports the discomfort is rated 10 on a 0-10 pain scale, described as aching, no aggravating relieving factors identified.  He has been taking ibuprofen  with temporary improvement of symptoms.  He has to broken and decayed teeth in this area but has not been able to find a dentist that will take Medicaid.  He knows that he needs to have them removed and is in the process of establishing with a new dental provider.  He denies any recent antibiotics; was last treated about 3 months ago with Augmentin  for similar issue.  He denies any swelling of his throat, shortness of breath, muffled voice.  He does have a history of diabetes but reports his blood sugars well-controlled and his last A1c was 6.4% on 03/19/2024.    Past Medical History:  Diagnosis Date   Diabetes mellitus type 2 in nonobese (HCC) 01/21/2016   Gastric ulcer    Hepatitis C    Hyperlipidemia    Hypertension    Substance abuse [facility])     Patient Active Problem List   Diagnosis Date Noted   Cheek laceration 09/06/2022   Maxillary fracture (HCC) 09/06/2022   Uninsured 02/04/2021   Transaminitis 02/04/2021   BMI 22.0-22.9, adult 01/15/2021   DKA (diabetic ketoacidosis) (HCC) 01/05/2021   DKA, type 2 (HCC) 01/04/2021   Open finger dislocation 02/26/2017   Diabetes mellitus type 2 in nonobese (HCC) 01/21/2016   Fall from chair 05/15/2015   Alcohol abuse 05/05/2015   Pain, dental 03/27/2013   HTN (hypertension) 04/25/2012   Chronic hepatitis C virus infection (HCC) 11/15/2007   ABDOMINAL PAIN-EPIGASTRIC 11/15/2007    Past Surgical History:  Procedure Laterality Date   I & D EXTREMITY Right 02/26/2017   Procedure:  IRRIGATION AND DEBRIDEMENT AND REDUCTION OF RIGHT MIDDLE FINGER;  Surgeon: Camella Elsie, MD;  Location: [facility] OR;  Service: Orthopedics;  Laterality: Right;   STOMACH SURGERY         Home Medications    Prior to Admission medications  Medication Sig Start Date End Date Taking? Authorizing Provider  amLODipine  (NORVASC ) 10 MG tablet Take 1 tablet (10 mg total) by mouth daily. 05/08/24   Tanda Bleacher, MD  amoxicillin -clavulanate (AUGMENTIN ) 875-125 MG tablet Take 1 tablet by mouth every 12 (twelve) hours. 05/28/24   Lorelie Biermann K, PA-C  azelastine  (OPTIVAR ) 0.05 % ophthalmic solution Place 1 drop into both eyes 2 (two) times daily. 12/05/23   Reddick, Ethel B, NP  Continuous Glucose Receiver (FREESTYLE LIBRE 2 READER) DEVI Use to check blood sugar continuously throughout the day. 12/06/22   Newlin, Enobong, MD  Continuous Glucose Sensor (FREESTYLE LIBRE 2 SENSOR) MISC Use to check blood sugar continuously throughout the day. 12/06/22   Newlin, Enobong, MD  doxycycline  (VIBRA -TABS) 100 MG tablet Take 1 tablet (100 mg total) by mouth 2 (two) times daily for 10 days. 08/16/23 12/16/23  Jaycee Greig PARAS, NP  erythromycin  ophthalmic ointment Place a 1/2 inch ribbon of ointment into the lower  and upper eyelid TWO TIMES A DAY. 08/23/23   Rodriguez-Southworth, Kyra, PA-C  fexofenadine  (ALLEGRA ) 180 MG tablet Take 1 tablet (180 mg total)  by mouth daily. 12/05/23   Reddick, Johnathan B, NP  glucose blood (TRUE METRIX BLOOD GLUCOSE TEST) test strip Use as instructed 01/15/21   [name], [name], PA-C  insulin  glargine (LANTUS  SOLOSTAR) 100 UNIT/ML Solostar Pen Inject 20 Units into the skin daily. 05/11/24   Tanda Bleacher, MD  Insulin  Pen Needle (TECHLITE PEN NEEDLES) 32G X 4 MM MISC Use to inject Lantus  once daily. 04/17/24   Tanda Bleacher, MD  lisinopril -hydrochlorothiazide  (ZESTORETIC ) 20-25 MG tablet Take 1 tablet by mouth daily. 05/08/24   Tanda Bleacher, MD  moxifloxacin  (VIGAMOX ) 0.5 % ophthalmic solution  Place 1 drop into both eyes 3 (three) times daily. 12/05/23   Reddick, Johnathan B, NP  pregabalin  (LYRICA ) 200 MG capsule Take 1 capsule (200 mg total) by mouth in the morning, at noon, and at bedtime. 05/02/24   Tanda Bleacher, MD  TRUEplus Lancets 28G MISC use as directed 01/15/21   [name], Kirk RAMAN, PA-C  lisinopril  (ZESTRIL ) 10 MG tablet Take 1 tablet (10 mg total) by mouth daily. 02/14/24 02/15/24  Tanda Bleacher, MD    Family History Family History  Problem Relation Age of Onset   Diabetes Father    Hypertension Father    Diabetes Sister    Hypertension Sister    Diabetes Brother    Hypertension Brother     Social History Social History[1]   Allergies   Ibuprofen , Aspirin, Other, and Tylenol  [acetaminophen ]   Review of Systems Review of Systems  Constitutional:  Negative for activity change, appetite change, fatigue and fever.  HENT:  Positive for dental problem. Negative for congestion, sore throat, trouble swallowing and voice change.   Respiratory:  Negative for shortness of breath.   Gastrointestinal:  Negative for diarrhea, nausea and vomiting.     Physical Exam Triage Vital Signs ED Triage Vitals  Encounter Vitals Group     BP 05/28/24 1153 102/74     Girls Systolic BP Percentile --      Girls Diastolic BP Percentile --      Boys Systolic BP Percentile --      Boys Diastolic BP Percentile --      Pulse Rate 05/28/24 1153 65     Resp 05/28/24 1153 18     Temp 05/28/24 1153 98 F (36.7 C)     Temp src --      SpO2 05/28/24 1153 98 %     Weight --      Height --      Head Circumference --      Peak Flow --      Pain Score 05/28/24 1151 10     Pain Loc --      Pain Education --      Exclude from Growth Chart --    No data found.  Updated Vital Signs BP 102/74   Pulse 65   Temp 98 F (36.7 C)   Resp 18   SpO2 98%   Visual Acuity Right Eye Distance:   Left Eye Distance:   Bilateral Distance:    Right Eye Near:   Left Eye Near:    Bilateral  Near:     Physical Exam Vitals reviewed.  Constitutional:      General: He is awake.     Appearance: Normal appearance. He is well-developed. He is not ill-appearing.     Comments: Very pleasant male appears stated age in no acute distress sitting comfortably in exam room  HENT:     Head: Normocephalic and atraumatic.  Right Ear: External ear normal.     Left Ear: External ear normal.     Nose: Nose normal.     Mouth/Throat:     Dentition: Dental tenderness, gingival swelling and dental caries present. No dental abscesses.     Pharynx: Uvula midline. No oropharyngeal exudate, posterior oropharyngeal erythema or uvula swelling.      Comments: Teeth numbers 30 and 31 are decayed to the level of the gum there is surrounding gingival swelling.  No evidence of Ludwig angina. Cardiovascular:     Rate and Rhythm: Normal rate and regular rhythm.     Heart sounds: Normal heart sounds, S1 normal and S2 normal. No murmur heard. Pulmonary:     Effort: Pulmonary effort is normal. No accessory muscle usage or respiratory distress.     Breath sounds: Normal breath sounds. No stridor. No wheezing, rhonchi or rales.     Comments: Clear to auscultation bilaterally Neurological:     Mental Status: He is alert.  Psychiatric:        Behavior: Behavior is cooperative.      UC Treatments / Results  Labs (all labs ordered are listed, but only abnormal results are displayed) Labs Reviewed - No data to display  EKG   Radiology No results found.  Procedures Procedures (including critical care time)  Medications Ordered in UC Medications - No data to display  Initial Impression / Assessment and Plan / UC Course  I have reviewed the triage vital signs and the nursing notes.  Pertinent labs & imaging results that were available during my care of the patient were reviewed by me and considered in my medical decision making (see chart for details).     Patient is well-appearing, afebrile,  nontoxic, nontachycardic.  No indication for emergent evaluation or imaging.  Patient was treated for dental infection with Augmentin  twice daily for 10 days.  No indication for dose adjustment based on metabolic panel from 03/02/2023 with a creatinine of 1.19 and calculated creatinine clearance of 76 mL/min.  He can use over-the-counter analgesics for pain relief.  Recommend he gargle with warm salt water for additional symptom relief.  Discussed that ultimately he will need to see dentist to address underlying tooth.  He was provided low-cost dental resources in the area with after visit summary.  If he develops any worsening symptoms including fever, nausea, vomiting, swelling of his throat, muffled voice, dysphagia he needs to go to the emergency room to which he expressed understanding.     Final Clinical Impressions(s) / UC Diagnoses   Final diagnoses:  Dental infection  Dentalgia     Discharge Instructions      Start Augmentin  twice daily for 10 days. Gargle with warm salt water for additional symptom relief.  You should follow-up with dentist; call to schedule an appointment.  If you develop any worsening symptoms including difficulty swallowing, difficulty speaking, swelling of your throat, high fever, change in your voice you need to be seen immediately.      ED Prescriptions     Medication Sig Dispense Auth. Provider   amoxicillin -clavulanate (AUGMENTIN ) 875-125 MG tablet Take 1 tablet by mouth every 12 (twelve) hours. 20 tablet Darlys Buis K, PA-C      PDMP not reviewed this encounter.    [1]  Social History Tobacco Use   Smoking status: Former    Types: Cigarettes    Passive exposure: Current   Smokeless tobacco: Never  Vaping Use   Vaping status: Never Used  Substance Use Topics   Alcohol use: Not Currently   Drug use: Not Currently    Types: Cocaine, Oxycodone      Sherrell Rocky POUR, PA-C 05/28/24 1217  "

## 2024-05-28 NOTE — ED Triage Notes (Signed)
 Dental pain on RT lower side for 4 days.

## 2024-05-30 ENCOUNTER — Other Ambulatory Visit: Payer: Self-pay

## 2024-05-30 MED ORDER — PREGABALIN 200 MG PO CAPS
200.0000 mg | ORAL_CAPSULE | Freq: Three times a day (TID) | ORAL | 0 refills | Status: DC
  Filled 2024-06-01: qty 90, 30d supply, fill #0

## 2024-06-01 ENCOUNTER — Other Ambulatory Visit (HOSPITAL_COMMUNITY): Payer: Self-pay

## 2024-06-01 ENCOUNTER — Other Ambulatory Visit: Payer: Self-pay

## 2024-06-04 ENCOUNTER — Other Ambulatory Visit: Payer: Self-pay

## 2024-06-04 ENCOUNTER — Telehealth: Payer: Self-pay

## 2024-06-04 NOTE — Telephone Encounter (Signed)
 Patient states that he is out of insulin  and pharmacy will not fill until Friday due to insurance purpose.      Copied from CRM [id number]. Topic: Clinical - Medication Question >> Jun 04, 2024 10:57 AM [name] wrote: Reason for CRM: insulin  glargine (LANTUS  SOLOSTAR) 100 UNIT/ML Solostar Pen- completely out  and does not have the money for emergency supply- pharmacy states he can't get it until Friday- [phone] >> Jun 04, 2024  2:46 PM [name] wrote: Patient calling again- has no more insulin 

## 2024-06-08 ENCOUNTER — Other Ambulatory Visit: Payer: Self-pay

## 2024-06-26 ENCOUNTER — Other Ambulatory Visit: Payer: Self-pay | Admitting: Family Medicine

## 2024-06-27 ENCOUNTER — Other Ambulatory Visit: Payer: Self-pay

## 2024-06-27 MED ORDER — PREGABALIN 200 MG PO CAPS
200.0000 mg | ORAL_CAPSULE | Freq: Three times a day (TID) | ORAL | 0 refills | Status: AC
  Filled 2024-06-29: qty 90, 30d supply, fill #0

## 2024-06-28 ENCOUNTER — Other Ambulatory Visit: Payer: Self-pay

## 2024-06-29 ENCOUNTER — Other Ambulatory Visit: Payer: Self-pay

## 2024-07-05 ENCOUNTER — Other Ambulatory Visit: Payer: Self-pay

## 2024-07-06 ENCOUNTER — Other Ambulatory Visit: Payer: Self-pay

## 2024-09-17 ENCOUNTER — Ambulatory Visit: Admitting: Family Medicine
